# Patient Record
Sex: Female | Born: 1950 | Race: White | Hispanic: No | Marital: Married | Smoking: Former smoker
Health system: Southern US, Community
[De-identification: ages and names within clinical notes are randomized; demographics above are authoritative.]

## PROBLEM LIST (undated history)

## (undated) DIAGNOSIS — E559 Vitamin D deficiency, unspecified: Secondary | ICD-10-CM

## (undated) DIAGNOSIS — K5140 Inflammatory polyps of colon without complications: Secondary | ICD-10-CM

## (undated) DIAGNOSIS — U071 COVID-19: Secondary | ICD-10-CM

## (undated) DIAGNOSIS — K219 Gastro-esophageal reflux disease without esophagitis: Secondary | ICD-10-CM

## (undated) DIAGNOSIS — M797 Fibromyalgia: Secondary | ICD-10-CM

## (undated) DIAGNOSIS — N390 Urinary tract infection, site not specified: Secondary | ICD-10-CM

## (undated) DIAGNOSIS — J189 Pneumonia, unspecified organism: Secondary | ICD-10-CM

## (undated) DIAGNOSIS — E785 Hyperlipidemia, unspecified: Secondary | ICD-10-CM

## (undated) DIAGNOSIS — M1990 Unspecified osteoarthritis, unspecified site: Secondary | ICD-10-CM

## (undated) DIAGNOSIS — I10 Essential (primary) hypertension: Secondary | ICD-10-CM

## (undated) DIAGNOSIS — Z87440 Personal history of urinary (tract) infections: Secondary | ICD-10-CM

## (undated) DIAGNOSIS — I6529 Occlusion and stenosis of unspecified carotid artery: Secondary | ICD-10-CM

## (undated) DIAGNOSIS — Z860100 Personal history of colon polyps, unspecified: Secondary | ICD-10-CM

## (undated) DIAGNOSIS — IMO0002 Reserved for concepts with insufficient information to code with codable children: Secondary | ICD-10-CM

## (undated) DIAGNOSIS — R7303 Prediabetes: Secondary | ICD-10-CM

## (undated) DIAGNOSIS — K259 Gastric ulcer, unspecified as acute or chronic, without hemorrhage or perforation: Secondary | ICD-10-CM

## (undated) HISTORY — DX: Reserved for concepts with insufficient information to code with codable children: IMO0002

## (undated) HISTORY — DX: Occlusion and stenosis of unspecified carotid artery: I65.29

## (undated) HISTORY — DX: Prediabetes: R73.03

## (undated) HISTORY — DX: Unspecified osteoarthritis, unspecified site: M19.90

## (undated) HISTORY — DX: Personal history of colon polyps, unspecified: Z86.0100

## (undated) HISTORY — DX: Gastro-esophageal reflux disease without esophagitis: K21.9

## (undated) HISTORY — DX: Personal history of urinary (tract) infections: Z87.440

## (undated) HISTORY — DX: Urinary tract infection, site not specified: N39.0

## (undated) HISTORY — DX: Pneumonia, unspecified organism: J18.9

## (undated) HISTORY — DX: COVID-19: U07.1

## (undated) HISTORY — DX: Vitamin D deficiency, unspecified: E55.9

## (undated) HISTORY — DX: Hyperlipidemia, unspecified: E78.5

## (undated) HISTORY — DX: Gastric ulcer, unspecified as acute or chronic, without hemorrhage or perforation: K25.9

## (undated) HISTORY — DX: Essential (primary) hypertension: I10

## (undated) HISTORY — DX: Personal history of colonic polyps: Z86.010

## (undated) HISTORY — DX: Inflammatory polyps of colon without complications: K51.40

## (undated) HISTORY — DX: Fibromyalgia: M79.7

---

## 1976-10-11 HISTORY — PX: HERNIA REPAIR: SHX51

## 2007-10-13 ENCOUNTER — Emergency Department: Payer: Self-pay | Admitting: Emergency Medicine

## 2011-01-12 ENCOUNTER — Encounter: Payer: Self-pay | Admitting: Family Medicine

## 2011-01-12 ENCOUNTER — Ambulatory Visit (INDEPENDENT_AMBULATORY_CARE_PROVIDER_SITE_OTHER): Payer: PRIVATE HEALTH INSURANCE | Admitting: Family Medicine

## 2011-01-12 DIAGNOSIS — E785 Hyperlipidemia, unspecified: Secondary | ICD-10-CM

## 2011-01-12 DIAGNOSIS — D126 Benign neoplasm of colon, unspecified: Secondary | ICD-10-CM

## 2011-01-12 DIAGNOSIS — K279 Peptic ulcer, site unspecified, unspecified as acute or chronic, without hemorrhage or perforation: Secondary | ICD-10-CM | POA: Insufficient documentation

## 2011-01-12 DIAGNOSIS — K635 Polyp of colon: Secondary | ICD-10-CM | POA: Insufficient documentation

## 2011-01-12 DIAGNOSIS — I10 Essential (primary) hypertension: Secondary | ICD-10-CM | POA: Insufficient documentation

## 2011-01-12 DIAGNOSIS — M797 Fibromyalgia: Secondary | ICD-10-CM | POA: Insufficient documentation

## 2011-01-12 DIAGNOSIS — IMO0001 Reserved for inherently not codable concepts without codable children: Secondary | ICD-10-CM

## 2011-01-12 DIAGNOSIS — N951 Menopausal and female climacteric states: Secondary | ICD-10-CM

## 2011-01-12 NOTE — Patient Instructions (Signed)
Avoid red meat/ fried foods/ egg yolks/ fatty breakfast meats/ butter, cheese and high fat dairy/ and shellfish   Stop the prempro-- and follow up if you have major problems or symptoms  Stop aleve Avoid ibuprofen and other anti inflammatories for stomach  Tylenol is ok  Please send for last labs and colonoscopy and pap from primary care  Schedule PE in July with labs prior

## 2011-01-12 NOTE — Progress Notes (Signed)
Subjective:    Patient ID: Beth Fisher, female    DOB: 1951/08/13, 60 y.o.   MRN: [id number]  HPI  Here to get est with new doctor for  numerous medical conditions  Used to see Dr Dagoberto Ligas -endocrinology for her primary care and he retired  Saw him for 20 years    Hyperlipidemia on crestor and zetia and welchol and antera (fenofibrate)  The crestor is twice per week  Last chol was very very high before starting meds Will call for lipids from feb  Eats a healthy diet  occ sat fats  Red meat -- twice weekly  No fried foods  Shellfish once per month  Eats low fat cheese, little ice cream , avoids butter  No southern breakfasts  1% milk    HTN on benicar Is in good control  Exercise 3 times per week at gym 45 treadmill and 15 of weights  Is proud of that   Menopause - on prempro On hrt - one pill every 3 days for a while  Had severe menopausal symptoms   Hx of ulcer-on otc acid reducer-- ? pepid   Thinks peptic Many years ago  No bleeding  No endo     Fibromyalgia - exercise really helps  On elavil  Other meds have not helped   No depression or anxiety   Will be due for health mt/ physical last summer- did not do pap  Does get regular mammograms July 11  Does regular derm exams - summer of 2011   Colon mass? In 09-- removed and then polyp in 2011 nov Dr Troy Sine -- 5 years   Past Medical History  Diagnosis Date  . Ulcer   . Hypertension   . Hyperlipidemia   . History of UTI   . Fibromyalgia   . Inflammatory polyps of colon     08/2008 OK 08/2010 [facility] Dr Kinnie Scales   Past Surgical History  Procedure Date  . Hernia repair 1978    reports that she quit smoking about 30 years ago. She does not have any smokeless tobacco history on file. Her alcohol and drug histories not on file. family history includes Alcohol abuse in her mother; Alzheimer's disease in her father and mother; Cancer in her mother and paternal grandmother; Diabetes in her father and paternal  grandmother; Heart disease in her cousin and father; Hyperlipidemia in her father and mother; and Mental illness in her father. No Known Allergies  History   Social History  . Marital Status: Married    Spouse Name: N/A    Number of Children: N/A  . Years of Education: N/A   Occupational History  . Not on file.   Social History Main Topics  . Smoking status: Former Smoker    Quit date: 10/11/1980  . Smokeless tobacco: Not on file  . Alcohol Use: Not on file  . Drug Use: Not on file  . Sexually Active: Not on file   Other Topics Concern  . Not on file   Social History Narrative  . No narrative on file    Family History  Problem Relation Age of Onset  . Alcohol abuse Mother   . Hyperlipidemia Mother   . Alzheimer's disease Mother   . Cancer Mother     breast cancer  . Diabetes Father   . Heart disease Father   . Hyperlipidemia Father   . Mental illness Father   . Alzheimer's disease Father   . Cancer Paternal Grandmother  colon  . Diabetes Paternal Grandmother   . Heart disease Cousin     sudden death less than 42 yrs old heart attack.           Review of Systems  Constitutional: Negative for fever, chills, fatigue and unexpected weight change.  Eyes: Negative for visual disturbance.  Respiratory: Negative for cough, chest tightness and shortness of breath.   Cardiovascular: Negative.   Gastrointestinal: Negative for abdominal pain, diarrhea and constipation.  Genitourinary: Negative for urgency, frequency and vaginal bleeding.  Musculoskeletal: Positive for myalgias, back pain and arthralgias. Negative for joint swelling.  Skin: Negative for color change, pallor and rash.  Neurological: Negative for tremors, weakness, light-headedness and headaches.  Hematological: Negative for adenopathy. Does not bruise/bleed easily.  Psychiatric/Behavioral: Negative for decreased concentration. The patient is not nervous/anxious.        Objective:   Physical  Exam  Constitutional: She appears well-developed and well-nourished.  HENT:  Head: Normocephalic and atraumatic.  Right Ear: External ear normal.  Left Ear: External ear normal.  Mouth/Throat: Oropharynx is clear and moist.  Eyes: Conjunctivae are normal. Pupils are equal, round, and reactive to light.  Neck: Normal range of motion. Neck supple. No JVD present. Carotid bruit is not present. No tracheal deviation present. No thyromegaly present.  Cardiovascular: Normal rate, regular rhythm and normal heart sounds.   Pulmonary/Chest: Effort normal and breath sounds normal.  Abdominal: Soft. Bowel sounds are normal. She exhibits no mass. There is no tenderness.  Musculoskeletal: She exhibits tenderness.  Lymphadenopathy:    She has no cervical adenopathy.  Neurological: She is alert. She has normal reflexes. She exhibits normal muscle tone. Coordination normal.  Skin: Skin is warm and dry. No rash noted. No pallor.  Psychiatric: She has a normal mood and affect.          Assessment & Plan:

## 2011-01-15 NOTE — Assessment & Plan Note (Signed)
Controlled with low impact exercise and elavil  No trigger points today  Pt feels she is doing well  Disc imp of good sleep and wt loss

## 2011-01-15 NOTE — Assessment & Plan Note (Signed)
No symptoms currently Adv NOT to use nsaids at all  Will continue to follow

## 2011-01-15 NOTE — Assessment & Plan Note (Signed)
Pt has decided to come off hrt Disc risks incl breast ca and also vascular/ blood clot  Feels risks outweigh benefits at this time Will update me if menopause symptoms become severe

## 2011-01-15 NOTE — Assessment & Plan Note (Signed)
Send for last colonosc report Is compliant with these No stool changes Will continue to follow

## 2011-01-15 NOTE — Assessment & Plan Note (Signed)
On mult meds for what sounds like extremely high chol Sent for lab records  Plan lab and f/u in summer Long disc of low sat fat diet today

## 2011-01-15 NOTE — Assessment & Plan Note (Signed)
In good control with current regimen Disc imp of wt loss and fitness  Sodium avoidance also disc Plan lab and PE for summer

## 2011-01-28 ENCOUNTER — Encounter: Payer: Self-pay | Admitting: Family Medicine

## 2011-02-10 ENCOUNTER — Other Ambulatory Visit: Payer: Self-pay | Admitting: *Deleted

## 2011-02-10 MED ORDER — COLESEVELAM HCL 625 MG PO TABS: ORAL_TABLET | ORAL | Status: DC

## 2011-04-20 ENCOUNTER — Telehealth (INDEPENDENT_AMBULATORY_CARE_PROVIDER_SITE_OTHER): Payer: PRIVATE HEALTH INSURANCE | Admitting: Family Medicine

## 2011-04-20 ENCOUNTER — Other Ambulatory Visit (INDEPENDENT_AMBULATORY_CARE_PROVIDER_SITE_OTHER): Payer: PRIVATE HEALTH INSURANCE | Admitting: Family Medicine

## 2011-04-20 DIAGNOSIS — E785 Hyperlipidemia, unspecified: Secondary | ICD-10-CM

## 2011-04-20 DIAGNOSIS — I10 Essential (primary) hypertension: Secondary | ICD-10-CM

## 2011-04-20 DIAGNOSIS — Z0000 Encounter for general adult medical examination without abnormal findings: Secondary | ICD-10-CM

## 2011-04-20 LAB — COMPREHENSIVE METABOLIC PANEL
ALT: 25 U/L (ref 0–35)
AST: 24 U/L (ref 0–37)
Albumin: 4.6 g/dL (ref 3.5–5.2)
Alkaline Phosphatase: 38 U/L — ABNORMAL LOW (ref 39–117)
BUN: 12 mg/dL (ref 6–23)
CO2: 27 mEq/L (ref 19–32)
Calcium: 9.5 mg/dL (ref 8.4–10.5)
Chloride: 110 mEq/L (ref 96–112)
Creatinine, Ser: 0.8 mg/dL (ref 0.4–1.2)
GFR: 76.54 mL/min (ref >60.00)
Glucose, Bld: 95 mg/dL (ref 70–99)
Potassium: 4.1 mEq/L (ref 3.5–5.1)
Sodium: 142 mEq/L (ref 135–145)
Total Bilirubin: 0.7 mg/dL (ref 0.3–1.2)
Total Protein: 7.5 g/dL (ref 6.0–8.3)

## 2011-04-20 LAB — CBC WITH DIFFERENTIAL/PLATELET
Basophils Absolute: 0 10*3/uL (ref 0.0–0.1)
Basophils Relative: 0.4 % (ref 0.0–3.0)
Eosinophils Absolute: 0.2 10*3/uL (ref 0.0–0.7)
Eosinophils Relative: 3.3 % (ref 0.0–5.0)
HCT: 37.7 % (ref 36.0–46.0)
Hemoglobin: 13.1 g/dL (ref 12.0–15.0)
Lymphocytes Relative: 35.1 % (ref 12.0–46.0)
Lymphs Abs: 1.8 10*3/uL (ref 0.7–4.0)
MCHC: 34.8 g/dL (ref 30.0–36.0)
MCV: 88.4 fl (ref 78.0–100.0)
Monocytes Absolute: 0.5 10*3/uL (ref 0.1–1.0)
Monocytes Relative: 10.2 % (ref 3.0–12.0)
Neutro Abs: 2.7 10*3/uL (ref 1.4–7.7)
Neutrophils Relative %: 51 % (ref 43.0–77.0)
Platelets: 328 10*3/uL (ref 150.0–400.0)
RBC: 4.26 Mil/uL (ref 3.87–5.11)
RDW: 12.4 % (ref 11.5–14.6)
WBC: 5.3 10*3/uL (ref 4.5–10.5)

## 2011-04-20 LAB — LIPID PANEL
Cholesterol: 198 mg/dL (ref 0–200)
HDL: 47.7 mg/dL (ref >39.00)
Total CHOL/HDL Ratio: 4
Triglycerides: 210 mg/dL — ABNORMAL HIGH (ref 0.0–149.0)
VLDL: 42 mg/dL — ABNORMAL HIGH (ref 0.0–40.0)

## 2011-04-20 LAB — LDL CHOLESTEROL, DIRECT: Direct LDL: 149.3 mg/dL

## 2011-04-20 LAB — TSH: TSH: 4.02 u[IU]/mL (ref 0.35–5.50)

## 2011-04-20 NOTE — Telephone Encounter (Signed)
Message copied by Judy Pimple on Tue Apr 20, 2011  8:36 AM ------      Message from: Alvina Chou      Created: Mon Apr 19, 2011 11:01 AM       Patient is scheduled for CPX labs, please order future labs, Thanks , Camelia Eng

## 2011-04-27 ENCOUNTER — Ambulatory Visit (INDEPENDENT_AMBULATORY_CARE_PROVIDER_SITE_OTHER): Payer: PRIVATE HEALTH INSURANCE | Admitting: Family Medicine

## 2011-04-27 ENCOUNTER — Other Ambulatory Visit (HOSPITAL_COMMUNITY)
Admission: RE | Admit: 2011-04-27 | Discharge: 2011-04-27 | Disposition: A | Discharge status: Home / Self Care | Payer: PRIVATE HEALTH INSURANCE | Source: Ambulatory Visit | Attending: Family Medicine | Admitting: Family Medicine

## 2011-04-27 ENCOUNTER — Encounter: Payer: Self-pay | Admitting: Family Medicine

## 2011-04-27 VITALS — BP 120/76 | HR 76 | Temp 97.9°F | Ht 66.0 in | Wt 227.2 lb

## 2011-04-27 DIAGNOSIS — N951 Menopausal and female climacteric states: Secondary | ICD-10-CM

## 2011-04-27 DIAGNOSIS — I10 Essential (primary) hypertension: Secondary | ICD-10-CM

## 2011-04-27 DIAGNOSIS — M797 Fibromyalgia: Secondary | ICD-10-CM

## 2011-04-27 DIAGNOSIS — K635 Polyp of colon: Secondary | ICD-10-CM

## 2011-04-27 DIAGNOSIS — E785 Hyperlipidemia, unspecified: Secondary | ICD-10-CM

## 2011-04-27 DIAGNOSIS — Z01419 Encounter for gynecological examination (general) (routine) without abnormal findings: Secondary | ICD-10-CM | POA: Insufficient documentation

## 2011-04-27 DIAGNOSIS — IMO0001 Reserved for inherently not codable concepts without codable children: Secondary | ICD-10-CM

## 2011-04-27 DIAGNOSIS — Z0000 Encounter for general adult medical examination without abnormal findings: Secondary | ICD-10-CM

## 2011-04-27 DIAGNOSIS — Z1159 Encounter for screening for other viral diseases: Secondary | ICD-10-CM | POA: Insufficient documentation

## 2011-04-27 DIAGNOSIS — D126 Benign neoplasm of colon, unspecified: Secondary | ICD-10-CM

## 2011-04-27 LAB — HM PAP SMEAR: HM Pap smear: NORMAL

## 2011-04-27 MED ORDER — ROSUVASTATIN CALCIUM 10 MG PO TABS: ORAL_TABLET | ORAL | Status: DC

## 2011-04-27 MED ORDER — COLESEVELAM HCL 625 MG PO TABS: ORAL_TABLET | ORAL | Status: DC

## 2011-04-27 MED ORDER — OLMESARTAN MEDOXOMIL 20 MG PO TABS: 20.0000 mg | ORAL_TABLET | Freq: Every day | ORAL | Status: DC

## 2011-04-27 MED ORDER — AMITRIPTYLINE HCL 25 MG PO TABS: 25.0000 mg | ORAL_TABLET | Freq: Every day | ORAL | Status: DC

## 2011-04-27 MED ORDER — FENOFIBRATE MICRONIZED 130 MG PO CAPS: 130.0000 mg | ORAL_CAPSULE | Freq: Every day | ORAL | Status: DC

## 2011-04-27 MED ORDER — EZETIMIBE 10 MG PO TABS: 10.0000 mg | ORAL_TABLET | Freq: Every day | ORAL | Status: DC

## 2011-04-27 NOTE — Progress Notes (Signed)
Subjective:    Patient ID: Beth Fisher, female    DOB: [birth year], 60 y.o.   MRN: [id number]  HPI Here for health mt exam and to review chronic med problems  Wt is down 7 lb  Is feeling good overall  No new medical problems  Went off her prempro and thinks she is doing ok overall  Is more outspoken    Gyn care Last gyn exam Dr Dagoberto Ligas  Was 2-3 years ago - wants to do her pap today No gyn problems  Td- 4 years ago  Zoster status has never had shingles  Is very interested in vaccine    Mammogram   (mother had breast cancer) Gets mam at [facility] - is due in aug/ sept and will make appt  colonosc 11/11 adenom polyp - re check 5 y No bowel changes   On welchol and zetia for chol and crestor  LDL 149 and HDL 47 Lab Results  Component Value Date   CHOL 198 04/20/2011   Lab Results  Component Value Date   HDL 47.70 04/20/2011   No results found for this basename: LDLCALC   Lab Results  Component Value Date   TRIG 210.0* 04/20/2011   Lab Results  Component Value Date   CHOLHDL 4 04/20/2011   Lab Results  Component Value Date   LDLDIRECT 149.3 04/20/2011   cannot go up any farther on crestor due to leg pain  Intol of all other statins  Per pt is much much better than it was   Stopped eating beef and is really working on her diet   Patient Active Problem List  Diagnoses  . Hypertension  . Hyperlipidemia  . Peptic ulcer disease  . Fibromyalgia  . Colon polyps  . Menopause syndrome  . Routine general medical examination at a health care facility  . Gynecological examination   Past Medical History  Diagnosis Date  . Ulcer   . Hypertension   . Hyperlipidemia   . History of UTI   . Fibromyalgia   . Inflammatory polyps of colon     08/2008 OK 08/2010 [facility] Dr Kinnie Scales   Past Surgical History  Procedure Date  . Hernia repair 1978   History  Substance Use Topics  . Smoking status: Former Smoker    Quit date: 10/11/1980  . Smokeless tobacco: Not on file    . Alcohol Use: Not on file   Family History  Problem Relation Age of Onset  . Alcohol abuse Mother   . Hyperlipidemia Mother   . Alzheimer's disease Mother   . Cancer Mother     breast cancer  . Diabetes Father   . Heart disease Father   . Hyperlipidemia Father   . Mental illness Father   . Alzheimer's disease Father   . Cancer Paternal Grandmother     colon  . Diabetes Paternal Grandmother   . Heart disease Cousin     sudden death less than 47 yrs old heart attack.   No Known Allergies Current Outpatient Prescriptions on File Prior to Visit  Medication Sig Dispense Refill  . famotidine (ACID REDUCER) 10 MG tablet OTC as directed.       Boris Lown Oil CAPS Take by mouth. 1 capsule by mouth daily (?Mg)       . Multiple Vitamin (MULTIVITAMIN) capsule Take 1 capsule by mouth daily.               Review of Systems Review of Systems  Constitutional: Negative for fever, appetite change, fatigue and unexpected weight change.  Eyes: Negative for pain and visual disturbance.  Respiratory: Negative for cough and shortness of breath.   Cardiovascular: Negative. For cp or palpitations    Gastrointestinal: Negative for nausea, diarrhea and constipation.  Genitourinary: Negative for urgency and frequency.  Skin: Negative for pallor. or rash MSK pos for joint and muscle pain that is chronic  Neurological: Negative for weakness, light-headedness, numbness and headaches.  Hematological: Negative for adenopathy. Does not bruise/bleed easily.  Psychiatric/Behavioral: Negative for dysphoric mood. The patient is not nervous/anxious.         Objective:   Physical Exam  Constitutional: She appears well-developed and well-nourished. No distress.       overwt and well appearing   HENT:  Head: Normocephalic and atraumatic.  Right Ear: External ear normal.  Left Ear: External ear normal.  Nose: Nose normal.  Mouth/Throat: Oropharynx is clear and moist.  Eyes: Conjunctivae and EOM are  normal. Pupils are equal, round, and reactive to light.  Neck: Normal range of motion. Neck supple. No JVD present. Carotid bruit is not present. No thyromegaly present.  Cardiovascular: Normal rate, regular rhythm, normal heart sounds and intact distal pulses.   Pulmonary/Chest: Effort normal and breath sounds normal. No respiratory distress. She has no wheezes.  Abdominal: Soft. Bowel sounds are normal. She exhibits no distension, no abdominal bruit and no mass. There is no tenderness.  Genitourinary: Vagina normal and uterus normal. No breast swelling, tenderness, discharge or bleeding. No vaginal discharge found.  Musculoskeletal: Normal range of motion. She exhibits no edema and no tenderness.  Lymphadenopathy:    She has no cervical adenopathy.  Neurological: She is alert. She has normal reflexes. Coordination normal.  Skin: Skin is warm and dry. No rash noted. No erythema. No pallor.  Psychiatric: She has a normal mood and affect.          Assessment & Plan:

## 2011-04-27 NOTE — Patient Instructions (Addendum)
Keep working on weight loss with healthy diet and exercise Avoid red meat/ fried foods/ egg yolks/ fatty breakfast meats/ butter, cheese and high fat dairy/ and shellfish   If you are interested in shingles vaccine in future - call your insurance company to see how coverage is and call us to schedule Follow up in 6 months  Don't forget to schedule your annual mammogram  Try to get 1200-1500 mg of calcium per day with at least 1000 iu of vitamin D - for bone health I sent your px in to the pharmacy

## 2011-04-27 NOTE — Assessment & Plan Note (Signed)
Pt non tol with statin - is best she can be on this combo of meds and sched Rev labs with pt Disc low sat fat diet  Lab 6 months

## 2011-04-27 NOTE — Assessment & Plan Note (Signed)
Ok off HRT- commended !

## 2011-04-27 NOTE — Assessment & Plan Note (Signed)
Reviewed health habits including diet and exercise and skin cancer prevention Also reviewed health mt list, fam hx and immunizations  Rev wellness labs in detail Will keep working on wt loss

## 2011-04-27 NOTE — Assessment & Plan Note (Signed)
Good control no change Rev labs with pt  Commended on lifestyle change and wt loss so far

## 2011-04-27 NOTE — Assessment & Plan Note (Signed)
Due for re check 2016- doing ok

## 2011-04-27 NOTE — Assessment & Plan Note (Signed)
Refilled elavil which works well

## 2011-04-27 NOTE — Assessment & Plan Note (Signed)
Nl exam Pend pap

## 2011-07-20 ENCOUNTER — Other Ambulatory Visit: Payer: Self-pay | Admitting: *Deleted

## 2011-07-21 ENCOUNTER — Encounter: Payer: Self-pay | Admitting: Family Medicine

## 2011-07-21 NOTE — Telephone Encounter (Signed)
Chart opened in error

## 2011-07-22 ENCOUNTER — Ambulatory Visit (INDEPENDENT_AMBULATORY_CARE_PROVIDER_SITE_OTHER): Payer: PRIVATE HEALTH INSURANCE | Admitting: *Deleted

## 2011-07-22 ENCOUNTER — Ambulatory Visit (INDEPENDENT_AMBULATORY_CARE_PROVIDER_SITE_OTHER): Payer: PRIVATE HEALTH INSURANCE

## 2011-07-22 DIAGNOSIS — Z23 Encounter for immunization: Secondary | ICD-10-CM

## 2011-07-22 DIAGNOSIS — Z2911 Encounter for prophylactic immunotherapy for respiratory syncytial virus (RSV): Secondary | ICD-10-CM

## 2011-07-23 ENCOUNTER — Encounter: Payer: Self-pay | Admitting: Family Medicine

## 2011-07-26 ENCOUNTER — Encounter: Payer: Self-pay | Admitting: *Deleted

## 2011-08-24 ENCOUNTER — Ambulatory Visit: Payer: PRIVATE HEALTH INSURANCE

## 2011-11-02 ENCOUNTER — Ambulatory Visit: Payer: PRIVATE HEALTH INSURANCE | Admitting: Family Medicine

## 2011-11-09 ENCOUNTER — Encounter: Payer: Self-pay | Admitting: Family Medicine

## 2011-11-09 ENCOUNTER — Ambulatory Visit (INDEPENDENT_AMBULATORY_CARE_PROVIDER_SITE_OTHER): Payer: PRIVATE HEALTH INSURANCE | Admitting: Family Medicine

## 2011-11-09 VITALS — BP 118/78 | HR 84 | Temp 97.3°F | Ht 67.0 in | Wt 231.8 lb

## 2011-11-09 DIAGNOSIS — E669 Obesity, unspecified: Secondary | ICD-10-CM | POA: Insufficient documentation

## 2011-11-09 DIAGNOSIS — J069 Acute upper respiratory infection, unspecified: Secondary | ICD-10-CM | POA: Insufficient documentation

## 2011-11-09 DIAGNOSIS — I10 Essential (primary) hypertension: Secondary | ICD-10-CM

## 2011-11-09 DIAGNOSIS — E785 Hyperlipidemia, unspecified: Secondary | ICD-10-CM

## 2011-11-09 LAB — LIPID PANEL
Cholesterol: 231 mg/dL — ABNORMAL HIGH (ref 0–200)
HDL: 49 mg/dL (ref >39.00)
Total CHOL/HDL Ratio: 5
Triglycerides: 113 mg/dL (ref 0.0–149.0)
VLDL: 22.6 mg/dL (ref 0.0–40.0)

## 2011-11-09 LAB — COMPREHENSIVE METABOLIC PANEL
ALT: 24 U/L (ref 0–35)
AST: 23 U/L (ref 0–37)
Albumin: 4.6 g/dL (ref 3.5–5.2)
Alkaline Phosphatase: 46 U/L (ref 39–117)
BUN: 11 mg/dL (ref 6–23)
CO2: 25 mEq/L (ref 19–32)
Calcium: 9.9 mg/dL (ref 8.4–10.5)
Chloride: 109 mEq/L (ref 96–112)
Creatinine, Ser: 0.9 mg/dL (ref 0.4–1.2)
GFR: 65.14 mL/min (ref >60.00)
Glucose, Bld: 90 mg/dL (ref 70–99)
Potassium: 4.7 mEq/L (ref 3.5–5.1)
Sodium: 144 mEq/L (ref 135–145)
Total Bilirubin: 0.7 mg/dL (ref 0.3–1.2)
Total Protein: 8 g/dL (ref 6.0–8.3)

## 2011-11-09 NOTE — Assessment & Plan Note (Signed)
Now off zetia due to myalgias Diet fair Rev low sat fat  Lab today

## 2011-11-09 NOTE — Assessment & Plan Note (Signed)
Viral uri with congestion Disc symptomatic care - see instructions on AVS  Update if not starting to improve in a week or if worsening

## 2011-11-09 NOTE — Assessment & Plan Note (Signed)
bp in fair control at this time  No changes needed  Disc lifstyle change with low sodium diet and exercise   

## 2011-11-09 NOTE — Progress Notes (Signed)
Subjective:    Patient ID: Beth Fisher, female    DOB: 24-Aug-1951, 61 y.o.   MRN: [id number]  HPI Here for f/u of HTN and lipids and also sick visit- uri Has been exp to both flu and strep - from little kids About 2 days ago -- R ear hurts and throat hurts Sinus congestion and drainage and sneezing  No chills or sweats or body aches  Had a flu shot     bp is 118/78     Today No cp or palpitations or headaches or edema  No side effects to medicines    Lab Results  Component Value Date   CHOL 198 04/20/2011   HDL 47.70 04/20/2011   LDLDIRECT 149.3 04/20/2011   TRIG 210.0* 04/20/2011   CHOLHDL 4 04/20/2011   welchol and crestor  Stopped the zetia- it gave her muscle aches -- and is improved now  crestor is just twice per week  Needs to check today  Is eating a low sat fat diet (did just have a birthday)    Wt is up 1 lb with bmi of 36 Aware she needs to loose wt for better health ? Needs to be more motivated   Patient Active Problem List  Diagnoses  . Hypertension  . Hyperlipidemia  . Peptic ulcer disease  . Fibromyalgia  . Colon polyps  . Menopause syndrome  . Routine general medical examination at a health care facility  . Gynecological examination  . Viral URI  . Obesity   Past Medical History  Diagnosis Date  . Ulcer   . Hypertension   . Hyperlipidemia   . History of UTI   . Fibromyalgia   . Inflammatory polyps of colon     08/2008 OK 08/2010 [facility] Dr Kinnie Scales   Past Surgical History  Procedure Date  . Hernia repair 1978   History  Substance Use Topics  . Smoking status: Former Smoker    Quit date: 10/11/1980  . Smokeless tobacco: Not on file  . Alcohol Use: Not on file   Family History  Problem Relation Age of Onset  . Alcohol abuse Mother   . Hyperlipidemia Mother   . Alzheimer's disease Mother   . Cancer Mother     breast cancer  . Diabetes Father   . Heart disease Father   . Hyperlipidemia Father   . Mental illness Father   .  Alzheimer's disease Father   . Cancer Paternal Grandmother     colon  . Diabetes Paternal Grandmother   . Heart disease Cousin     sudden death less than 19 yrs old heart attack.   Allergies  Allergen Reactions  . Zetia (Ezetimibe) Other (See Comments)    Muscle aches   Current Outpatient Prescriptions on File Prior to Visit  Medication Sig Dispense Refill  . amitriptyline (ELAVIL) 25 MG tablet Take 1 tablet (25 mg total) by mouth at bedtime.  30 tablet  11  . colesevelam (WELCHOL) 625 MG tablet Take 3 tablets by mouth twice a day.  180 tablet  11  . famotidine (ACID REDUCER) 10 MG tablet OTC as directed.       . fenofibrate micronized (ANTARA) 130 MG capsule Take 1 capsule (130 mg total) by mouth daily before breakfast.  30 capsule  11  . Krill Oil CAPS Take by mouth. 1 capsule by mouth daily (?Mg)       . Multiple Vitamin (MULTIVITAMIN) capsule Take 1 capsule by mouth daily.        Marland Kitchen  olmesartan (BENICAR) 20 MG tablet Take 1 tablet (20 mg total) by mouth daily.  30 tablet  11  . Prenatal MV-Min-Fe Cbn-FA-DHA (BRAINSTRONG PRENATAL PO) Take 300 mg by mouth daily.        . rosuvastatin (CRESTOR) 10 MG tablet 1 tablet by mouth on Mon and Thur of each week.  8 tablet  11       Review of Systems Review of Systems  Constitutional: Negative for fever, appetite change, and unexpected weight change. pos for fatigue Eyes: Negative for pain and visual disturbance.  ENT pos for rhinorrhea and congestion and st , neg for sinus pain  Respiratory: Negative for sob or wheeze  Cardiovascular: Negative for cp or palpitations    Gastrointestinal: Negative for nausea, diarrhea and constipation.  Genitourinary: Negative for urgency and frequency.  Skin: Negative for pallor or rash   Neurological: Negative for weakness, light-headedness, numbness and headaches.  Hematological: Negative for adenopathy. Does not bruise/bleed easily.  Psychiatric/Behavioral: Negative for dysphoric mood. The patient is  not nervous/anxious.          Objective:   Physical Exam  Constitutional: She appears well-developed and well-nourished. No distress.       Obese and well appearing   HENT:  Head: Normocephalic and atraumatic.  Right Ear: External ear normal.  Left Ear: External ear normal.  Mouth/Throat: No oropharyngeal exudate.       Nares are injected and congested  No sinus tenderness Throat- mild post nasal drip/ no erythema  Eyes: Conjunctivae and EOM are normal. Pupils are equal, round, and reactive to light. Right eye exhibits no discharge. Left eye exhibits no discharge. No scleral icterus.  Neck: Normal range of motion. Neck supple. No JVD present. Carotid bruit is not present. No thyromegaly present.  Cardiovascular: Normal rate, regular rhythm, normal heart sounds and intact distal pulses.  Exam reveals no gallop.   Pulmonary/Chest: Effort normal and breath sounds normal. No respiratory distress. She has no wheezes. She exhibits no tenderness.  Abdominal: Soft. Bowel sounds are normal. She exhibits no distension, no abdominal bruit and no mass. There is no tenderness.  Musculoskeletal: She exhibits no edema and no tenderness.  Lymphadenopathy:    She has no cervical adenopathy.  Neurological: She is alert. She has normal reflexes. No cranial nerve deficit. She exhibits normal muscle tone. Coordination normal.  Skin: Skin is warm and dry. No rash noted. No erythema. No pallor.  Psychiatric: She has a normal mood and affect.          Assessment & Plan:

## 2011-11-09 NOTE — Patient Instructions (Signed)
Drink lots of fluids and rest  Tylenol cold is ok  Use nasal saline spray  You will start coughing more as the week goes on  If high fever- call  Update if not starting to improve in a week or if worsening   No change in medicines Lab today bp is good Work on weight loss Schedule annual exam in 6 months with labs prior

## 2011-11-10 LAB — LDL CHOLESTEROL, DIRECT: Direct LDL: 168.2 mg/dL

## 2011-11-11 NOTE — Assessment & Plan Note (Signed)
Discussed how this problem influences overall health and the risks it imposes  Reviewed plan for weight loss with lower calorie diet (via better food choices and also portion control or program like weight watchers) and exercise building up to or more than 30 minutes 5 days per week including some aerobic activity    

## 2011-12-10 DIAGNOSIS — J189 Pneumonia, unspecified organism: Secondary | ICD-10-CM

## 2011-12-10 HISTORY — DX: Pneumonia, unspecified organism: J18.9

## 2011-12-16 ENCOUNTER — Ambulatory Visit (INDEPENDENT_AMBULATORY_CARE_PROVIDER_SITE_OTHER)
Admission: RE | Admit: 2011-12-16 | Discharge: 2011-12-16 | Disposition: A | Discharge status: Home / Self Care | Payer: PRIVATE HEALTH INSURANCE | Source: Ambulatory Visit | Attending: Family Medicine | Admitting: Family Medicine

## 2011-12-16 ENCOUNTER — Telehealth: Payer: Self-pay | Admitting: Family Medicine

## 2011-12-16 ENCOUNTER — Encounter (HOSPITAL_COMMUNITY): Payer: Self-pay | Admitting: Internal Medicine

## 2011-12-16 ENCOUNTER — Encounter: Payer: Self-pay | Admitting: Family Medicine

## 2011-12-16 ENCOUNTER — Inpatient Hospital Stay (HOSPITAL_COMMUNITY)
Admission: EM | Admit: 2011-12-16 | Discharge: 2011-12-20 | DRG: 193 | Disposition: A | Discharge status: Home / Self Care | Payer: PRIVATE HEALTH INSURANCE | Attending: Internal Medicine | Admitting: Internal Medicine

## 2011-12-16 ENCOUNTER — Ambulatory Visit (INDEPENDENT_AMBULATORY_CARE_PROVIDER_SITE_OTHER): Payer: PRIVATE HEALTH INSURANCE | Admitting: Family Medicine

## 2011-12-16 VITALS — BP 126/78 | HR 116 | Temp 102.5°F

## 2011-12-16 DIAGNOSIS — R05 Cough: Secondary | ICD-10-CM

## 2011-12-16 DIAGNOSIS — R059 Cough, unspecified: Secondary | ICD-10-CM

## 2011-12-16 DIAGNOSIS — N951 Menopausal and female climacteric states: Secondary | ICD-10-CM

## 2011-12-16 DIAGNOSIS — IMO0001 Reserved for inherently not codable concepts without codable children: Secondary | ICD-10-CM | POA: Diagnosis present

## 2011-12-16 DIAGNOSIS — K635 Polyp of colon: Secondary | ICD-10-CM

## 2011-12-16 DIAGNOSIS — M797 Fibromyalgia: Secondary | ICD-10-CM

## 2011-12-16 DIAGNOSIS — I10 Essential (primary) hypertension: Secondary | ICD-10-CM

## 2011-12-16 DIAGNOSIS — Z01419 Encounter for gynecological examination (general) (routine) without abnormal findings: Secondary | ICD-10-CM

## 2011-12-16 DIAGNOSIS — J9600 Acute respiratory failure, unspecified whether with hypoxia or hypercapnia: Secondary | ICD-10-CM | POA: Diagnosis present

## 2011-12-16 DIAGNOSIS — R509 Fever, unspecified: Secondary | ICD-10-CM

## 2011-12-16 DIAGNOSIS — J189 Pneumonia, unspecified organism: Principal | ICD-10-CM

## 2011-12-16 DIAGNOSIS — D649 Anemia, unspecified: Secondary | ICD-10-CM | POA: Diagnosis present

## 2011-12-16 DIAGNOSIS — I959 Hypotension, unspecified: Secondary | ICD-10-CM | POA: Diagnosis present

## 2011-12-16 DIAGNOSIS — E785 Hyperlipidemia, unspecified: Secondary | ICD-10-CM

## 2011-12-16 DIAGNOSIS — Z0000 Encounter for general adult medical examination without abnormal findings: Secondary | ICD-10-CM

## 2011-12-16 DIAGNOSIS — K279 Peptic ulcer, site unspecified, unspecified as acute or chronic, without hemorrhage or perforation: Secondary | ICD-10-CM

## 2011-12-16 DIAGNOSIS — E669 Obesity, unspecified: Secondary | ICD-10-CM

## 2011-12-16 DIAGNOSIS — R5381 Other malaise: Secondary | ICD-10-CM | POA: Diagnosis present

## 2011-12-16 LAB — URINALYSIS, ROUTINE W REFLEX MICROSCOPIC
Bilirubin Urine: NEGATIVE
Glucose, UA: NEGATIVE mg/dL
Hgb urine dipstick: NEGATIVE
Ketones, ur: NEGATIVE mg/dL
Leukocytes, UA: NEGATIVE
Nitrite: POSITIVE — AB
Protein, ur: NEGATIVE mg/dL
Specific Gravity, Urine: 1.013 (ref 1.005–1.030)
Urobilinogen, UA: 0.2 mg/dL (ref 0.0–1.0)
pH: 6 (ref 5.0–8.0)

## 2011-12-16 LAB — COMPREHENSIVE METABOLIC PANEL
ALT: 22 U/L (ref 0–35)
AST: 22 U/L (ref 0–37)
Albumin: 3.8 g/dL (ref 3.5–5.2)
Alkaline Phosphatase: 46 U/L (ref 39–117)
BUN: 12 mg/dL (ref 6–23)
CO2: 20 mEq/L (ref 19–32)
Calcium: 8.7 mg/dL (ref 8.4–10.5)
Chloride: 104 mEq/L (ref 96–112)
Creatinine, Ser: 0.84 mg/dL (ref 0.50–1.10)
GFR calc Af Amer: 85 mL/min — ABNORMAL LOW (ref >90)
GFR calc non Af Amer: 74 mL/min — ABNORMAL LOW (ref >90)
Glucose, Bld: 97 mg/dL (ref 70–99)
Potassium: 3.2 mEq/L — ABNORMAL LOW (ref 3.5–5.1)
Sodium: 135 mEq/L (ref 135–145)
Total Bilirubin: 0.6 mg/dL (ref 0.3–1.2)
Total Protein: 6.9 g/dL (ref 6.0–8.3)

## 2011-12-16 LAB — DIFFERENTIAL
Basophils Absolute: 0 10*3/uL (ref 0.0–0.1)
Basophils Relative: 0 % (ref 0–1)
Eosinophils Absolute: 0 10*3/uL (ref 0.0–0.7)
Eosinophils Relative: 0 % (ref 0–5)
Lymphocytes Relative: 9 % — ABNORMAL LOW (ref 12–46)
Lymphs Abs: 0.9 10*3/uL (ref 0.7–4.0)
Monocytes Absolute: 0.6 10*3/uL (ref 0.1–1.0)
Monocytes Relative: 6 % (ref 3–12)
Neutro Abs: 8.6 10*3/uL — ABNORMAL HIGH (ref 1.7–7.7)
Neutrophils Relative %: 85 % — ABNORMAL HIGH (ref 43–77)
Smear Review: ADEQUATE

## 2011-12-16 LAB — CBC
HCT: 34.5 % — ABNORMAL LOW (ref 36.0–46.0)
Hemoglobin: 11.9 g/dL — ABNORMAL LOW (ref 12.0–15.0)
MCH: 29.9 pg (ref 26.0–34.0)
MCHC: 34.5 g/dL (ref 30.0–36.0)
MCV: 86.7 fL (ref 78.0–100.0)
Platelets: 221 10*3/uL (ref 150–400)
RBC: 3.98 MIL/uL (ref 3.87–5.11)
RDW: 13 % (ref 11.5–15.5)
WBC: 10.1 10*3/uL (ref 4.0–10.5)

## 2011-12-16 LAB — LACTIC ACID, PLASMA: Lactic Acid, Venous: 1.6 mmol/L (ref 0.5–2.2)

## 2011-12-16 LAB — URINE MICROSCOPIC-ADD ON

## 2011-12-16 LAB — PROCALCITONIN: Procalcitonin: 1.88 ng/mL

## 2011-12-16 MED ORDER — ALBUTEROL SULFATE (5 MG/ML) 0.5% IN NEBU
2.5000 mg | INHALATION_SOLUTION | RESPIRATORY_TRACT | Status: DC | PRN
  Administered 2011-12-17 – 2011-12-18 (×4): 2.5 mg via RESPIRATORY_TRACT
  Filled 2011-12-16 (×4): qty 0.5

## 2011-12-16 MED ORDER — COLESEVELAM HCL 625 MG PO TABS
1875.0000 mg | ORAL_TABLET | Freq: Two times a day (BID) | ORAL | Status: DC
  Administered 2011-12-17 – 2011-12-20 (×7): 1875 mg via ORAL
  Filled 2011-12-16 (×8): qty 3

## 2011-12-16 MED ORDER — AZITHROMYCIN 250 MG PO TABS: ORAL_TABLET | ORAL | Status: DC

## 2011-12-16 MED ORDER — ACETAMINOPHEN 500 MG PO TABS
500.0000 mg | ORAL_TABLET | Freq: Once | ORAL | Status: AC
  Administered 2011-12-17: 500 mg via ORAL

## 2011-12-16 MED ORDER — ACETAMINOPHEN 500 MG PO TABS
500.0000 mg | ORAL_TABLET | Freq: Once | ORAL | Status: AC
  Administered 2011-12-16: 500 mg via ORAL

## 2011-12-16 MED ORDER — ASPIRIN EC 81 MG PO TBEC
81.0000 mg | DELAYED_RELEASE_TABLET | Freq: Every day | ORAL | Status: DC
  Administered 2011-12-17 – 2011-12-20 (×4): 81 mg via ORAL
  Filled 2011-12-16 (×4): qty 1

## 2011-12-16 MED ORDER — ACETAMINOPHEN 500 MG PO TABS
1000.0000 mg | ORAL_TABLET | Freq: Four times a day (QID) | ORAL | Status: DC | PRN
  Administered 2011-12-17 – 2011-12-19 (×3): 1000 mg via ORAL
  Filled 2011-12-16 (×4): qty 2

## 2011-12-16 MED ORDER — SODIUM CHLORIDE 0.9 % IJ SOLN
3.0000 mL | Freq: Two times a day (BID) | INTRAMUSCULAR | Status: DC
  Administered 2011-12-17: 3 mL via INTRAVENOUS

## 2011-12-16 MED ORDER — SODIUM CHLORIDE 0.9 % IV SOLN
1000.0000 mL | Freq: Once | INTRAVENOUS | Status: AC
  Administered 2011-12-16: 1000 mL via INTRAVENOUS

## 2011-12-16 MED ORDER — ATORVASTATIN CALCIUM 10 MG PO TABS: 10.0000 mg | ORAL_TABLET | Freq: Every day | ORAL | Status: DC

## 2011-12-16 MED ORDER — AZITHROMYCIN 500 MG PO TABS
500.0000 mg | ORAL_TABLET | Freq: Once | ORAL | Status: DC
  Filled 2011-12-16: qty 1

## 2011-12-16 MED ORDER — CEFTRIAXONE SODIUM IN DEXTROSE 40 MG/ML IV SOLN
1.0000 g | INTRAVENOUS | Status: AC
  Administered 2011-12-16: 1 g via INTRAVENOUS
  Filled 2011-12-16: qty 50

## 2011-12-16 MED ORDER — AZITHROMYCIN 250 MG PO TABS: 250.0000 mg | ORAL_TABLET | ORAL | Status: DC

## 2011-12-16 MED ORDER — ONDANSETRON HCL 4 MG/2ML IJ SOLN: 4.0000 mg | Freq: Four times a day (QID) | INTRAMUSCULAR | Status: DC | PRN

## 2011-12-16 MED ORDER — AMITRIPTYLINE HCL 25 MG PO TABS
25.0000 mg | ORAL_TABLET | Freq: Every day | ORAL | Status: DC
  Administered 2011-12-17 – 2011-12-19 (×4): 25 mg via ORAL
  Filled 2011-12-16 (×5): qty 1

## 2011-12-16 MED ORDER — ACETAMINOPHEN 500 MG PO TABS: 500.0000 mg | ORAL_TABLET | Freq: Once | ORAL | Status: DC

## 2011-12-16 MED ORDER — DEXTROSE 5 % IV SOLN
500.0000 mg | INTRAVENOUS | Status: DC
  Filled 2011-12-16: qty 500

## 2011-12-16 MED ORDER — ONDANSETRON HCL 4 MG PO TABS: 4.0000 mg | ORAL_TABLET | Freq: Four times a day (QID) | ORAL | Status: DC | PRN

## 2011-12-16 MED ORDER — SODIUM CHLORIDE 0.9 % IJ SOLN: 3.0000 mL | INTRAMUSCULAR | Status: DC | PRN

## 2011-12-16 MED ORDER — ENOXAPARIN SODIUM 40 MG/0.4ML ~~LOC~~ SOLN
40.0000 mg | Freq: Every day | SUBCUTANEOUS | Status: DC
  Administered 2011-12-17 – 2011-12-19 (×4): 40 mg via SUBCUTANEOUS
  Filled 2011-12-16 (×5): qty 0.4

## 2011-12-16 MED ORDER — POTASSIUM CHLORIDE CRYS ER 20 MEQ PO TBCR
40.0000 meq | EXTENDED_RELEASE_TABLET | Freq: Every day | ORAL | Status: DC
  Administered 2011-12-16 – 2011-12-20 (×5): 40 meq via ORAL
  Filled 2011-12-16 (×5): qty 2

## 2011-12-16 MED ORDER — ALBUTEROL SULFATE (5 MG/ML) 0.5% IN NEBU
2.5000 mg | INHALATION_SOLUTION | Freq: Once | RESPIRATORY_TRACT | Status: AC
  Administered 2011-12-16: 2.5 mg via RESPIRATORY_TRACT
  Filled 2011-12-16: qty 0.5

## 2011-12-16 MED ORDER — AZITHROMYCIN 250 MG PO TABS: 250.0000 mg | ORAL_TABLET | Freq: Every day | ORAL | Status: DC

## 2011-12-16 MED ORDER — ATORVASTATIN CALCIUM 10 MG PO TABS
10.0000 mg | ORAL_TABLET | Freq: Every day | ORAL | Status: DC
  Filled 2011-12-16 (×2): qty 1

## 2011-12-16 MED ORDER — CEFTRIAXONE SODIUM 1 G IJ SOLR: 1.0000 g | Freq: Once | INTRAMUSCULAR | Status: DC

## 2011-12-16 MED ORDER — AZITHROMYCIN 500 MG IV SOLR
500.0000 mg | Freq: Once | INTRAVENOUS | Status: AC
  Administered 2011-12-16 (×2): 500 mg via INTRAVENOUS
  Filled 2011-12-16: qty 500

## 2011-12-16 MED ORDER — SODIUM CHLORIDE 0.9 % IJ SOLN
3.0000 mL | Freq: Two times a day (BID) | INTRAMUSCULAR | Status: DC
  Administered 2011-12-16 – 2011-12-19 (×7): 3 mL via INTRAVENOUS

## 2011-12-16 MED ORDER — ALBUTEROL SULFATE (5 MG/ML) 0.5% IN NEBU
2.5000 mg | INHALATION_SOLUTION | Freq: Four times a day (QID) | RESPIRATORY_TRACT | Status: DC
  Administered 2011-12-16 – 2011-12-17 (×2): 2.5 mg via RESPIRATORY_TRACT
  Filled 2011-12-16 (×2): qty 0.5

## 2011-12-16 MED ORDER — CEFTRIAXONE SODIUM 1 G IJ SOLR
1.0000 g | Freq: Once | INTRAMUSCULAR | Status: AC
  Administered 2011-12-16: 1 g via INTRAMUSCULAR

## 2011-12-16 MED ORDER — WHITE PETROLATUM GEL
Status: AC
  Administered 2011-12-16: 1
  Filled 2011-12-16: qty 5

## 2011-12-16 MED ORDER — METHYLPREDNISOLONE SODIUM SUCC 125 MG IJ SOLR
125.0000 mg | Freq: Once | INTRAMUSCULAR | Status: AC
  Administered 2011-12-16: 125 mg via INTRAVENOUS
  Filled 2011-12-16: qty 2

## 2011-12-16 MED ORDER — SODIUM CHLORIDE 0.9 % IV SOLN: 1000.0000 mL | INTRAVENOUS | Status: DC

## 2011-12-16 MED ORDER — SODIUM CHLORIDE 0.9 % IV SOLN
250.0000 mL | INTRAVENOUS | Status: DC | PRN
  Administered 2011-12-17: 250 mL via INTRAVENOUS

## 2011-12-16 MED ORDER — DEXTROSE 5 % IV SOLN
1.0000 g | INTRAVENOUS | Status: DC
  Administered 2011-12-17: 1 g via INTRAVENOUS
  Filled 2011-12-16: qty 10
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
## 2011-12-16 NOTE — ED Notes (Signed)
Attempted to call report to 4 W. RN unable to take report at this time. Will call back.  

## 2011-12-16 NOTE — ED Notes (Signed)
C/o flu like s/s x5 days ,..had flu shot

## 2011-12-16 NOTE — Assessment & Plan Note (Addendum)
No focal lung findings.  With story anticipate PNA. CXR today - LUL and lingular PNA on my read. Treat as CAP with shot of CTX in office as well as zpack prescribed. Update [place] if not improving as expected.  ==> upon discussion of results of workup and PNA, pt exhibiting increasing confusion, AMS according to daughter, temperature up to 105.1, continues tachycardic.  rec eval at ER and possible admission - concern for sepsis.  Spoke with charge nurse advising pt on her way.  Received 1gm tylenol at 2:30pm and 1gm CTX IM in office.

## 2011-12-16 NOTE — H&P (Signed)
PCP:   Roxy Manns, MD, MD   Chief Complaint: Progressive shortness of breath and thick brown sputum productive cough.    HPI: Beth Fisher is an 61 y.o. female with history of fibromyalgia, hypertension, presents to [facility] long emergency room with thick brown sputum productive cough for 4 days along with increased shortness of breath. He she had upper respiratory infection symptoms on-and-off since January. This time around her illness began about 4 days ago. She also has chills and subjective fever along with some myalgia. Today she also was confused during a temperature spike up to 103. Evaluation in emergency room included a normal white count, low potassium of 3.2, normal renal function tests. Her chest x-ray showed multifocal infiltrate. She also was found to be originally hypotensive, code sepsis was called, but she responded to intravenous fluid with resulting blood pressure of 130/70. Hospitalist was asked to admit her for community-acquired pneumonia.  Rewiew of Systems:  The patient denies anorexia, weight loss,, vision loss, decreased hearing, hoarseness, chest pain, syncope,  peripheral edema, balance deficits, hemoptysis, abdominal pain, melena, hematochezia, severe indigestion/heartburn, hematuria, incontinence, genital sores, muscle weakness, suspicious skin lesions, transient blindness, difficulty walking, depression, unusual weight change, abnormal bleeding, enlarged lymph nodes, angioedema, and breast masses.   Past Medical History  Diagnosis Date  . Ulcer   . Hypertension   . Hyperlipidemia   . History of UTI   . Fibromyalgia   . Inflammatory polyps of colon     08/2008 OK 08/2010 Ok  byk Dr Kinnie Scales  . Pneumonia 12/2011    Past Surgical History  Procedure Date  . Hernia repair 1978    Medications:  HOME MEDS: Prior to Admission medications   Medication Sig Start Date End Date Taking? Authorizing Provider  acetaminophen (TYLENOL) 500 MG tablet Take 1,000 mg by  mouth every 6 (six) hours as needed. pain   Yes Historical Provider, MD  amitriptyline (ELAVIL) 25 MG tablet Take 1 tablet (25 mg total) by mouth at bedtime. 04/27/11  Yes Roxy Manns, MD  aspirin-sod bicarb-citric acid (ALKA-SELTZER) 325 MG TBEF Take 650 mg by mouth every 6 (six) hours as needed. Cold symptons   Yes Historical Provider, MD  colesevelam (WELCHOL) 625 MG tablet Take 1,875 mg by mouth 2 (two) times daily with a meal. Take 3 tablets by mouth twice a day. 04/27/11  Yes Roxy Manns, MD  famotidine (ACID REDUCER) 10 MG tablet Take 10 mg by mouth daily as needed. OTC as directed.   Yes Historical Provider, MD  fenofibrate micronized (ANTARA) 130 MG capsule Take 1 capsule (130 mg total) by mouth daily before breakfast. 04/27/11  Yes Roxy Manns, MD  [facility] CAPS Take by mouth. 1 capsule by mouth daily (?Mg)    Yes Historical Provider, MD  Multiple Vitamin (MULTIVITAMIN) capsule Take 1 capsule by mouth daily.     Yes Historical Provider, MD  olmesartan (BENICAR) 20 MG tablet Take 1 tablet (20 mg total) by mouth daily. 04/27/11  Yes Roxy Manns, MD  Prenatal MV-Min-Fe Cbn-FA-DHA Albertha Ghee PRENATAL PO) Take 300 mg by mouth daily.     Yes Historical Provider, MD  rosuvastatin (CRESTOR) 10 MG tablet Take 10 mg by mouth See admin instructions. Takes 1 tablet by mouth on Mon and Thur of each week. 04/27/11  Yes Roxy Manns, MD  azithromycin (ZITHROMAX) 250 MG tablet Take 250 mg by mouth See admin instructions. Two on day 1 followed by one daily for 4 days for total of 5 days, PO  12/16/11 12/21/11  Eustaquio Boyden, MD     Allergies:  Allergies  Allergen Reactions  . Zetia (Ezetimibe) Other (See Comments)    Muscle aches!!!    Social History:   reports that she quit smoking about 31 years ago. She does not have any smokeless tobacco history on file. Her alcohol and drug histories not on file.  Family History: Family History  Problem Relation Age of Onset  . Alcohol abuse Mother   .  Hyperlipidemia Mother   . Alzheimer's disease Mother   . Cancer Mother     breast cancer  . Diabetes Father   . Heart disease Father   . Hyperlipidemia Father   . Mental illness Father   . Alzheimer's disease Father   . Cancer Paternal Grandmother     colon  . Diabetes Paternal Grandmother   . Heart disease Cousin     sudden death less than 54 yrs old heart attack.     Physical Exam: Filed Vitals:   12/16/11 1644 12/16/11 1653 12/16/11 1942  BP:  131/69 120/67  Pulse:  102 95  Temp:  100.1 F (37.8 C) 99.3 F (37.4 C)  TempSrc:  Oral Oral  Resp:  18 23  SpO2: 96% 99% 97%   Blood pressure 120/67, pulse 95, temperature 99.3 F (37.4 C), temperature source Oral, resp. rate 23, last menstrual period 10/11/2000, SpO2 97.00%.  GEN:  Pleasant  person lying in the stretcher in no acute distress; cooperative with exam PSYCH:  alert and oriented x4; does not appear anxious does not appear depressed; affect is normal HEENT: Mucous membranes pink and anicteric; PERRLA; EOM intact; no cervical lymphadenopathy nor thyromegaly or carotid bruit; no JVD; Breasts:: Not examined CHEST WALL: No tenderness CHEST: Normal respiration, but with both inspiratory wheezes and crackles heard on her left mid lung field. HEART: Regular rate and rhythm; no murmurs rubs or gallops BACK: No kyphosis or scoliosis; no CVA tenderness ABDOMEN: Obese, soft non-tender; no masses, no organomegaly, normal abdominal bowel sounds; no pannus; no intertriginous candida. Rectal Exam: Not done EXTREMITIES: No bone or joint deformity; age-appropriate arthropathy of the hands and knees; no edema; no ulcerations. Genitalia: not examined PULSES: 2+ and symmetric SKIN: Normal hydration no rash or ulceration CNS: Cranial nerves 2-12 grossly intact no focal neurologic deficit   Labs & Imaging Results for orders placed during the hospital encounter of 12/16/11 (from the past 48 hour(s))  LACTIC ACID, PLASMA     Status:  Normal   Collection Time   12/16/11  4:56 PM      Component Value Range Comment   Lactic Acid, Venous 1.6  0.5 - 2.2 (mmol/L)   PROCALCITONIN     Status: Normal   Collection Time   12/16/11  4:56 PM      Component Value Range Comment   Procalcitonin 1.88     CBC     Status: Abnormal   Collection Time   12/16/11  4:58 PM      Component Value Range Comment   WBC 10.1  4.0 - 10.5 (K/uL)    RBC 3.98  3.87 - 5.11 (MIL/uL)    Hemoglobin 11.9 (*) 12.0 - 15.0 (g/dL)    HCT 57.8 (*) 46.9 - 46.0 (%)    MCV 86.7  78.0 - 100.0 (fL)    MCH 29.9  26.0 - 34.0 (pg)    MCHC 34.5  30.0 - 36.0 (g/dL)    RDW 62.9  52.8 - 41.3 (%)  Platelets 221  150 - 400 (K/uL)   DIFFERENTIAL     Status: Abnormal   Collection Time   12/16/11  4:58 PM      Component Value Range Comment   Neutrophils Relative 85 (*) 43 - 77 (%)    Lymphocytes Relative 9 (*) 12 - 46 (%)    Monocytes Relative 6  3 - 12 (%)    Eosinophils Relative 0  0 - 5 (%)    Basophils Relative 0  0 - 1 (%)    Neutro Abs 8.6 (*) 1.7 - 7.7 (K/uL)    Lymphs Abs 0.9  0.7 - 4.0 (K/uL)    Monocytes Absolute 0.6  0.1 - 1.0 (K/uL)    Eosinophils Absolute 0.0  0.0 - 0.7 (K/uL)    Basophils Absolute 0.0  0.0 - 0.1 (K/uL)    WBC Morphology MILD LEFT SHIFT (1-5% METAS, OCC MYELO, OCC BANDS)   INCREASED BANDS (>20% BANDS)   Smear Review        Value: PLATELET CLUMPS NOTED ON SMEAR, COUNT APPEARS ADEQUATE  COMPREHENSIVE METABOLIC PANEL     Status: Abnormal   Collection Time   12/16/11  4:58 PM      Component Value Range Comment   Sodium 135  135 - 145 (mEq/L)    Potassium 3.2 (*) 3.5 - 5.1 (mEq/L)    Chloride 104  96 - 112 (mEq/L)    CO2 20  19 - 32 (mEq/L)    Glucose, Bld 97  70 - 99 (mg/dL)    BUN 12  6 - 23 (mg/dL)    Creatinine, Ser 4.54  0.50 - 1.10 (mg/dL)    Calcium 8.7  8.4 - 10.5 (mg/dL)    Total Protein 6.9  6.0 - 8.3 (g/dL)    Albumin 3.8  3.5 - 5.2 (g/dL)    AST 22  0 - 37 (U/L)    ALT 22  0 - 35 (U/L)    Alkaline Phosphatase 46  39 - 117  (U/L)    Total Bilirubin 0.6  0.3 - 1.2 (mg/dL)    GFR calc non Af Amer 74 (*) >90 (mL/min)    GFR calc Af Amer 85 (*) >90 (mL/min)   URINALYSIS, ROUTINE W REFLEX MICROSCOPIC     Status: Abnormal   Collection Time   12/16/11  5:13 PM      Component Value Range Comment   Color, Urine YELLOW  YELLOW     APPearance CLEAR  CLEAR     Specific Gravity, Urine 1.013  1.005 - 1.030     pH 6.0  5.0 - 8.0     Glucose, UA NEGATIVE  NEGATIVE (mg/dL)    Hgb urine dipstick NEGATIVE  NEGATIVE     Bilirubin Urine NEGATIVE  NEGATIVE     Ketones, ur NEGATIVE  NEGATIVE (mg/dL)    Protein, ur NEGATIVE  NEGATIVE (mg/dL)    Urobilinogen, UA 0.2  0.0 - 1.0 (mg/dL)    Nitrite POSITIVE (*) NEGATIVE     Leukocytes, UA NEGATIVE  NEGATIVE    URINE MICROSCOPIC-ADD ON     Status: Abnormal   Collection Time   12/16/11  5:13 PM      Component Value Range Comment   Squamous Epithelial / LPF RARE  RARE     WBC, UA 0-2  <3 (WBC/hpf)    Bacteria, UA FEW (*) RARE     Dg Chest 2 View  12/16/2011  *RADIOLOGY REPORT*  Clinical Data: Fever, productive  cough, weakness  CHEST - 2 VIEW  Comparison: None.  Findings: Patchy opacities in the left upper lobe, lingula, and possibly the right upper lobe, suspicious for pneumonia. No pleural effusion or pneumothorax.  Cardiomediastinal silhouette is within normal limits.  Mild degenerative changes of the visualized thoracolumbar spine.  IMPRESSION: Multifocal patchy opacities, suspicious for pneumonia.  Original Report Authenticated By: Charline Bills, M.D.      Assessment Community-acquired pneumonia Hypotension Bronchospasm Fibromyalgia Hyperlipidemia  PLAN: Will admit her to telemetry and continue Rocephin and Zithromax. Blood cultures have been done. If she is no longer hypotensive Will discontinue IV fluid. For now will hold her ACE inhibitor, but will continue her Crestor at low dose. She will require nebulizer treatments. She is doing better after 2 L of fluid in the  emergency room, and I will admit her to telemetry. She is stable, full code, and will be admitted to triad hospitalist service [facility] team 3.   Other plans as per orders.   Kendan Cornforth 12/16/2011, 9:35 PM

## 2011-12-16 NOTE — Patient Instructions (Addendum)
To ER today for further evaluation. I have called and they should be expecting you there.

## 2011-12-16 NOTE — Telephone Encounter (Signed)
Triage Record Num: [id number] Operator: Valene Bors Patient Name: Beth Fisher Call Date & Time: 12/16/2011 11:32:36AM Patient Phone: [phone] PCP: Audrie Gallus. Tower Patient Gender: Female PCP Fax : Patient DOB: [birth year] Practice Name: [facility] Reason for Call: Caller: Laura/Child; PCP: Roxy Manns A.; CB#: [phone]; Call regarding Cough/Congestion- she is coughing up greenish brown sputum and side of face hurts, R ear and R jaw, and throat feels raw. She is having chills-fever per tactile- taking Acetaminophen and Alkaselter q 4 hr prn. Onset of sx in Jan 2013 - cough and sore throat that never completely went away; Sx worse since 12/13/11 and she is short of breath at times and feels weak and tired. She is able to get up still doing some ADL's s/a using BR and taking dog out, but has some dizziness at times that clears in a min or two. Daughter will bring her to appnt at office at 2 pm. Care advice per Cough Protocol. Protocol(s) Used: Cough - Adult Recommended Outcome per Protocol: See Provider within 4 hours Reason for Outcome: New onset or worsening cough AND temperature of 101.5 F (38.6C) or greater Care Advice: ~ Another adult should drive. ~ Call provider if symptoms worsen or new symptoms develop. During pregnancy, call provider if temperature is 100 F (37.7 C) or greater OR any temperature elevation for 3 days even while taking acetaminophen. ~ ~ SYMPTOM / CONDITION MANAGEMENT ~ CAUTIONS ~ List, or take, all current prescription(s), nonprescription or alternative medication(s) to provider for evaluation. Most adults need to drink 6-10 eight-ounce glasses (1.2-2.0 liters) of fluids per day unless previously told to limit fluid intake for other medical reasons. Limit fluids that contain caffeine, sugar or alcohol. Urine will be a very light yellow color when you drink enough fluids. ~ Analgesic/Antipyretic Advice - Acetaminophen: Consider acetaminophen as  directed on label or by pharmacist/provider for pain or fever PRECAUTIONS: - Use if there is no history of liver disease, alcoholism, or intake of three or more alcohol drinks per day - Only if approved by provider during pregnancy or when breastfeeding - During pregnancy, acetaminophen should not be taken more than 3 consecutive days without telling provider - Do not exceed recommended dose or frequency ~ Systemic Inflammatory Response Syndrome (SIRS): Watch for signs of a generalized, whole body infection. Occurs within days of a localized infection, especially of the urinary, GI, respiratory or nervous systems; or after a traumatic injury or invasive procedure. - Call EMS 911 if symptoms have worsened, such as increasing confusion or unusual drowsiness; cold and clammy skin; no urine output; rapid respiration (>30/min.) or slow respiration (<10/min.); struggling to breathe. - Go to the ED immediately for early symptoms of rapid pulse >90/min. or rapid breathing >20/min. at rest; chills; oral temperature >100.4 F (38 C) or <96.8 F (36 C) when associated with conditions noted. ~ 12/16/2011 11:59:11AM Page 1 of 1 [id number]

## 2011-12-16 NOTE — ED Provider Notes (Signed)
History     CSN: [id number]  Arrival date & time 12/16/11  1632   First MD Initiated Contact with Patient 12/16/11 1645      Chief Complaint  Patient presents with  . Fever    (Consider location/radiation/quality/duration/timing/severity/associated sxs/prior treatment) HPI This 61 year old female has 4-5 days of fever cough shortness of breath at times and now he feels generally weak with lightheadedness and had transient confusion at her primary care doctor's office prior to arrival to the ED today. She was diagnosed with pneumonia by chest x-ray the primary care doctor's office and received 1 g of Rocephin IM prior to transferring the patient to the ED. The patient is no longer confused but was tachycardic with systolic pressure in the 90s and pulse rate in the 120s at her doctor's office prior to arrival. She also has some nasal congestion and right ear pain. She has no severe headache, no confusion now, no rash, no abdominal pain, vomiting, or diarrhea. She has no lateralizing weakness or numbness just generalized weakness with fatigue and lightheadedness today. She received 500 mL saline bolus IV by EMS prior to arrival today. She received Tylenol at her primary care doctor's office prior to arrival today. Past Medical History  Diagnosis Date  . Ulcer   . Hypertension   . Hyperlipidemia   . History of UTI   . Fibromyalgia   . Inflammatory polyps of colon     08/2008 OK 08/2010 Ok  byk Dr Kinnie Scales  . Pneumonia 12/2011    Past Surgical History  Procedure Date  . Hernia repair 1978    Family History  Problem Relation Age of Onset  . Alcohol abuse Mother   . Hyperlipidemia Mother   . Alzheimer's disease Mother   . Cancer Mother     breast cancer  . Diabetes Father   . Heart disease Father   . Hyperlipidemia Father   . Mental illness Father   . Alzheimer's disease Father   . Cancer Paternal Grandmother     colon  . Diabetes Paternal Grandmother   . Heart disease Cousin       sudden death less than 61 yrs old heart attack.    History  Substance Use Topics  . Smoking status: Former Smoker    Quit date: 10/11/1980  . Smokeless tobacco: Not on file  . Alcohol Use: Not on file    OB History    Grav Para Term Preterm Abortions TAB SAB Ect Mult Living                  Review of Systems  Constitutional: Positive for fever.       10 Systems reviewed and are negative for acute change except as noted in the HPI.  HENT: Positive for congestion.   Eyes: Negative for discharge and redness.  Respiratory: Positive for cough and shortness of breath.   Cardiovascular: Negative for chest pain.  Gastrointestinal: Negative for vomiting and abdominal pain.  Musculoskeletal: Negative for back pain.  Skin: Negative for rash.  Neurological: Positive for weakness and light-headedness. Negative for syncope, numbness and headaches.  Psychiatric/Behavioral:       No behavior change.    Allergies  Zetia  Home Medications   No current outpatient prescriptions on file.  BP 116/77  Pulse 84  Temp(Src) 98.1 F (36.7 C) (Oral)  Resp 20  Ht 5\' 7"  (1.702 m)  Wt 233 lb 1.6 oz (105.733 kg)  BMI 36.51 kg/m2  SpO2 95%  LMP 10/11/2000  Physical Exam  Nursing note and vitals reviewed. Constitutional: She is oriented to person, place, and time.       Awake, alert, nontoxic appearance.  HENT:  Head: Atraumatic.       Dry oral mucosa  Eyes: Conjunctivae are normal. Pupils are equal, round, and reactive to light. Right eye exhibits no discharge. Left eye exhibits no discharge.  Neck: Neck supple.  Cardiovascular: Regular rhythm.   No murmur heard.      Tachycardic  Pulmonary/Chest: Effort normal. No respiratory distress. She has wheezes. She has no rales. She exhibits no tenderness.       The patient speaks full sentences with mild tachypnea with faint scattered wheezes with no crackles no rhonchi no retractions no accessory muscle usage  Abdominal: Soft. There is  no tenderness. There is no rebound.  Musculoskeletal: She exhibits no edema and no tenderness.       Baseline ROM, no obvious new focal weakness.  Neurological: She is alert and oriented to person, place, and time.       Mental status and motor strength appears baseline for patient and situation.  Skin: No rash noted.  Psychiatric: She has a normal mood and affect.    ED Course  Procedures (including critical care time)  Labs Reviewed  CBC - Abnormal; Notable for the following:    Hemoglobin 11.9 (*)    HCT 34.5 (*)    All other components within normal limits  DIFFERENTIAL - Abnormal; Notable for the following:    Neutrophils Relative 85 (*)    Lymphocytes Relative 9 (*)    Neutro Abs 8.6 (*)    All other components within normal limits  COMPREHENSIVE METABOLIC PANEL - Abnormal; Notable for the following:    Potassium 3.2 (*)    GFR calc non Af Amer 74 (*)    GFR calc Af Amer 85 (*)    All other components within normal limits  URINALYSIS, ROUTINE W REFLEX MICROSCOPIC - Abnormal; Notable for the following:    Nitrite POSITIVE (*)    All other components within normal limits  URINE MICROSCOPIC-ADD ON - Abnormal; Notable for the following:    Bacteria, UA FEW (*)    All other components within normal limits  BASIC METABOLIC PANEL - Abnormal; Notable for the following:    Glucose, Bld 149 (*)    GFR calc non Af Amer 89 (*)    All other components within normal limits  CBC - Abnormal; Notable for the following:    WBC 12.2 (*)    RBC 3.82 (*)    Hemoglobin 11.3 (*)    HCT 33.2 (*)    All other components within normal limits  LACTIC ACID, PLASMA  PROCALCITONIN  INFLUENZA PANEL BY PCR  CULTURE, BLOOD (ROUTINE X 2)  CULTURE, BLOOD (ROUTINE X 2)  URINE CULTURE  HIV ANTIBODY (ROUTINE TESTING)  CBC  BASIC METABOLIC PANEL  LEGIONELLA ANTIGEN, URINE  STREP PNEUMONIAE URINARY ANTIGEN   Dg Chest 2 View  12/16/2011  *RADIOLOGY REPORT*  Clinical Data: Fever, productive cough,  weakness  CHEST - 2 VIEW  Comparison: None.  Findings: Patchy opacities in the left upper lobe, lingula, and possibly the right upper lobe, suspicious for pneumonia. No pleural effusion or pneumothorax.  Cardiomediastinal silhouette is within normal limits.  Mild degenerative changes of the visualized thoracolumbar spine.  IMPRESSION: Multifocal patchy opacities, suspicious for pneumonia.  Original Report Authenticated By: Charline Bills, M.D.     1. Community  acquired pneumonia   2. PNA (pneumonia)   3. Fever       MDM  Patient / Family / Caregiver understand and agree with initial ED impression and plan with expectations set for ED visit.The patient appears reasonably stabilized for admission considering the current resources, flow, and capabilities available in the ED at this time, and I doubt any other [facility] requiring further screening and/or treatment in the ED prior to admission.        Hurman Horn, MD 12/17/11 2126

## 2011-12-16 NOTE — ED Notes (Signed)
MD at bedside. Dr. Le, hospitalist, at bedside.  

## 2011-12-16 NOTE — Progress Notes (Signed)
  Subjective:    Patient ID: Beth Fisher, female    DOB: [birth year], 61 y.o.   MRN: [id number]  HPI CC: cough, fever, weakness  4d h/o ear pain, HA, ST.  Productive cough started 4d ago as well, has gotten progressively worse.  Green/brown sputum.  Tmax 103.  Now feeling weak as well.  Over last 24 hours significant worsening.  + more SOB, dizzy, HA currently.  Nauseated.  Pleuritic chest pain.  11/09/2011 with viral URTI, treated supportively with mucinex, tylenol.  Improved, then worsened again.  Has also been using alka seltzer.  No abd pain, sinus congestion, rashes.  + sick contacts at home recently. Husband recently with walking pneumonia. Not on abx recently.  Mother in nursing home, visits weekly.  No smokers at home.  No h/o asthma, COPD.  On benicar.  Thinks staying well hydrated.  Review of Systems Per HPI    Objective:   Physical Exam  Nursing note and vitals reviewed. Constitutional: She appears well-developed and well-nourished. No distress.  HENT:  Head: Normocephalic and atraumatic.  Right Ear: Hearing, tympanic membrane, external ear and ear canal normal.  Left Ear: Hearing, tympanic membrane, external ear and ear canal normal.  Nose: No mucosal edema or rhinorrhea. Right sinus exhibits no maxillary sinus tenderness and no frontal sinus tenderness. Left sinus exhibits no maxillary sinus tenderness and no frontal sinus tenderness.  Mouth/Throat: Uvula is midline and mucous membranes are normal. Posterior oropharyngeal erythema present. No oropharyngeal exudate, posterior oropharyngeal edema or tonsillar abscesses.  Eyes: Conjunctivae and EOM are normal. Pupils are equal, round, and reactive to light. No scleral icterus.  Neck: Normal range of motion. Neck supple.  Cardiovascular: Normal rate, regular rhythm, normal heart sounds and intact distal pulses.   No murmur heard. Pulmonary/Chest: Breath sounds normal. No respiratory distress (SOB with complete  sentences). She has no wheezes. She has no rales.       Somewhat decreased air movement bases of lung. Coarse breath sounds L>R apices  Musculoskeletal: She exhibits no edema.  Lymphadenopathy:    She has no cervical adenopathy.  Skin: Skin is warm and dry. No rash noted.       Assessment & Plan:

## 2011-12-17 LAB — BASIC METABOLIC PANEL
BUN: 11 mg/dL (ref 6–23)
CO2: 22 mEq/L (ref 19–32)
Calcium: 9 mg/dL (ref 8.4–10.5)
Chloride: 109 mEq/L (ref 96–112)
Creatinine, Ser: 0.76 mg/dL (ref 0.50–1.10)
GFR calc Af Amer: >90 mL/min (ref >90)
GFR calc non Af Amer: 89 mL/min — ABNORMAL LOW (ref >90)
Glucose, Bld: 149 mg/dL — ABNORMAL HIGH (ref 70–99)
Potassium: 3.8 mEq/L (ref 3.5–5.1)
Sodium: 139 mEq/L (ref 135–145)

## 2011-12-17 LAB — INFLUENZA PANEL BY PCR (TYPE A & B)
H1N1 flu by pcr: NOT DETECTED
Influenza A By PCR: NEGATIVE
Influenza B By PCR: NEGATIVE

## 2011-12-17 LAB — CBC
HCT: 33.2 % — ABNORMAL LOW (ref 36.0–46.0)
Hemoglobin: 11.3 g/dL — ABNORMAL LOW (ref 12.0–15.0)
MCH: 29.6 pg (ref 26.0–34.0)
MCHC: 34 g/dL (ref 30.0–36.0)
MCV: 86.9 fL (ref 78.0–100.0)
Platelets: 207 10*3/uL (ref 150–400)
RBC: 3.82 MIL/uL — ABNORMAL LOW (ref 3.87–5.11)
RDW: 13.2 % (ref 11.5–15.5)
WBC: 12.2 10*3/uL — ABNORMAL HIGH (ref 4.0–10.5)

## 2011-12-17 MED ORDER — ROSUVASTATIN CALCIUM 10 MG PO TABS
10.0000 mg | ORAL_TABLET | ORAL | Status: DC
  Filled 2011-12-17: qty 1

## 2011-12-17 MED ORDER — CEFUROXIME AXETIL 500 MG PO TABS
500.0000 mg | ORAL_TABLET | Freq: Two times a day (BID) | ORAL | Status: DC
  Administered 2011-12-17 – 2011-12-20 (×6): 500 mg via ORAL
  Filled 2011-12-17 (×7): qty 1

## 2011-12-17 MED ORDER — NON FORMULARY: 10.0000 mg | Status: DC

## 2011-12-17 MED ORDER — AZITHROMYCIN 250 MG PO TABS
250.0000 mg | ORAL_TABLET | Freq: Every day | ORAL | Status: DC
  Administered 2011-12-17 – 2011-12-20 (×4): 250 mg via ORAL
  Filled 2011-12-17 (×4): qty 1

## 2011-12-17 NOTE — Progress Notes (Signed)
Subjective: Her breathing is better today than yesterday.  No other specific complaints.  Objective: Vital signs in last 24 hours: Filed Vitals:   12/17/11 0548 12/17/11 0948 12/17/11 1058 12/17/11 1415  BP: 101/61  113/71 116/77  Pulse: 71  82 84  Temp: 97.8 F (36.6 C)  98 F (36.7 C) 98.1 F (36.7 C)  TempSrc: Oral  Oral Oral  Resp: 20  20 20   Height:      Weight: 105.733 kg (233 lb 1.6 oz)     SpO2: 92% 96%  95%   Weight change:   Intake/Output Summary (Last 24 hours) at 12/17/11 1743 Last data filed at 12/17/11 1300  Gross per 24 hour  Intake    483 ml  Output   1025 ml  Net   -542 ml    Physical Exam: General: Awake, Oriented, No acute distress. HEENT: EOMI. Neck: Supple CV: S1 and S2 Lungs: Clear to ascultation bilaterally Abdomen: Soft, Nontender, Nondistended, +bowel sounds. Ext: Good pulses. Trace edema.  Lab Results:  Basename 12/17/11 0441 12/16/11 1658  NA 139 135  K 3.8 3.2*  CL 109 104  CO2 22 20  GLUCOSE 149* 97  BUN 11 12  CREATININE 0.76 0.84  CALCIUM 9.0 8.7  MG -- --  PHOS -- --    Basename 12/16/11 1658  AST 22  ALT 22  ALKPHOS 46  BILITOT 0.6  PROT 6.9  ALBUMIN 3.8   No results found for this basename: LIPASE:2,AMYLASE:2 in the last 72 hours  Basename 12/17/11 0441 12/16/11 1658  WBC 12.2* 10.1  NEUTROABS -- 8.6*  HGB 11.3* 11.9*  HCT 33.2* 34.5*  MCV 86.9 86.7  PLT 207 221   No results found for this basename: CKTOTAL:3,CKMB:3,CKMBINDEX:3,TROPONINI:3 in the last 72 hours No components found with this basename: POCBNP:3 No results found for this basename: DDIMER:2 in the last 72 hours No results found for this basename: HGBA1C:2 in the last 72 hours No results found for this basename: CHOL:2,HDL:2,LDLCALC:2,TRIG:2,CHOLHDL:2,LDLDIRECT:2 in the last 72 hours No results found for this basename: TSH,T4TOTAL,FREET3,T3FREE,THYROIDAB in the last 72 hours No results found for this basename:  VITAMINB12:2,FOLATE:2,FERRITIN:2,TIBC:2,IRON:2,RETICCTPCT:2 in the last 72 hours  Micro Results: No results found for this or any previous visit (from the past 240 hour(s)).  Studies/Results: Dg Chest 2 View  12/16/2011  *RADIOLOGY REPORT*  Clinical Data: Fever, productive cough, weakness  CHEST - 2 VIEW  Comparison: None.  Findings: Patchy opacities in the left upper lobe, lingula, and possibly the right upper lobe, suspicious for pneumonia. No pleural effusion or pneumothorax.  Cardiomediastinal silhouette is within normal limits.  Mild degenerative changes of the visualized thoracolumbar spine.  IMPRESSION: Multifocal patchy opacities, suspicious for pneumonia.  Original Report Authenticated By: Charline Bills, M.D.    Medications: I have reviewed the patient's current medications. Scheduled Meds:   . sodium chloride  1,000 mL Intravenous Once  . acetaminophen  500 mg Oral Once  . amitriptyline  25 mg Oral QHS  . aspirin EC  81 mg Oral Daily  . azithromycin  500 mg Intravenous Once  . azithromycin  250 mg Oral Daily  . cefTRIAXone  1 g Intravenous To ER  . cefUROXime  500 mg Oral BID WC  . colesevelam  1,875 mg Oral BID WC  . enoxaparin  40 mg Subcutaneous QHS  . methylPREDNISolone (SOLU-MEDROL) injection  125 mg Intravenous Once  . potassium chloride  40 mEq Oral Daily  . rosuvastatin  10 mg Oral Custom  .  sodium chloride  3 mL Intravenous Q12H  . white petrolatum      . DISCONTD: acetaminophen  500 mg Oral Once  . DISCONTD: albuterol  2.5 mg Nebulization Q6H  . DISCONTD: atorvastatin  10 mg Oral q1800  . DISCONTD: atorvastatin  10 mg Oral q1800  . DISCONTD: azithromycin  500 mg Intravenous Q24H  . DISCONTD: azithromycin  250 mg Oral See admin instructions  . DISCONTD: azithromycin  250 mg Oral Daily  . DISCONTD: azithromycin  500 mg Oral Once  . DISCONTD: cefTRIAXone (ROCEPHIN)  IV  1 g Intravenous Q24H  . DISCONTD: cefTRIAXone (ROCEPHIN) IM  1 g Intramuscular Once  .  DISCONTD: NON FORMULARY 10 mg  10 mg Oral 2 times weekly  . DISCONTD: sodium chloride  3 mL Intravenous Q12H   Continuous Infusions:   . DISCONTD: sodium chloride     PRN Meds:.acetaminophen, albuterol, ondansetron (ZOFRAN) IV, ondansetron, DISCONTD: sodium chloride, DISCONTD: sodium chloride  Assessment/Plan: 1. Acute respiratory failure secondary to community-acquired pneumonia. Influenza PCR negative.  Improved on ceftriaxone and azithromycin, transitioned to cefuroxime and azithromycin.antibiotics since 12/16/2011.  Will send for urine Legionella and urine strep pneumo.  Given atypical presentation will send for HIV antibody.  Discontinue telemetry.  No events noted on telemetry.  2. Hypertension.  Improved.  Now normotensive.  Continue to hold olmesartan.  3. Hyperlipidemia.  Continue statin.  4. Fibromyalgia.  Stable.  5.  Prophylaxis.  Lovenox.  6.  Disposition.  Pending.  If the patient continues to improve over the next 24-48 hours consider discharge.   LOS: 1 day  Godwin Tedesco A, MD 12/17/2011, 5:43 PM

## 2011-12-18 LAB — URINE CULTURE
Colony Count: NO GROWTH
Culture  Setup Time: 201303080341
Culture: NO GROWTH

## 2011-12-18 LAB — CBC
HCT: 30.8 % — ABNORMAL LOW (ref 36.0–46.0)
Hemoglobin: 10.4 g/dL — ABNORMAL LOW (ref 12.0–15.0)
MCH: 29.7 pg (ref 26.0–34.0)
MCHC: 33.8 g/dL (ref 30.0–36.0)
MCV: 88 fL (ref 78.0–100.0)
Platelets: 215 10*3/uL (ref 150–400)
RBC: 3.5 MIL/uL — ABNORMAL LOW (ref 3.87–5.11)
RDW: 13.9 % (ref 11.5–15.5)
WBC: 9 10*3/uL (ref 4.0–10.5)

## 2011-12-18 LAB — BASIC METABOLIC PANEL
BUN: 14 mg/dL (ref 6–23)
CO2: 23 mEq/L (ref 19–32)
Calcium: 9 mg/dL (ref 8.4–10.5)
Chloride: 110 mEq/L (ref 96–112)
Creatinine, Ser: 0.77 mg/dL (ref 0.50–1.10)
GFR calc Af Amer: >90 mL/min (ref >90)
GFR calc non Af Amer: 89 mL/min — ABNORMAL LOW (ref >90)
Glucose, Bld: 101 mg/dL — ABNORMAL HIGH (ref 70–99)
Potassium: 3.9 mEq/L (ref 3.5–5.1)
Sodium: 141 mEq/L (ref 135–145)

## 2011-12-18 LAB — STREP PNEUMONIAE URINARY ANTIGEN: Strep Pneumo Urinary Antigen: NEGATIVE

## 2011-12-18 LAB — HIV ANTIBODY (ROUTINE TESTING W REFLEX): HIV: NONREACTIVE

## 2011-12-18 MED ORDER — METHYLPREDNISOLONE SODIUM SUCC 40 MG IJ SOLR
40.0000 mg | Freq: Four times a day (QID) | INTRAMUSCULAR | Status: AC
  Administered 2011-12-18 – 2011-12-19 (×3): 40 mg via INTRAVENOUS
  Filled 2011-12-18 (×3): qty 1

## 2011-12-18 MED ORDER — PREDNISONE 20 MG PO TABS
40.0000 mg | ORAL_TABLET | Freq: Every day | ORAL | Status: DC
  Administered 2011-12-19 – 2011-12-20 (×2): 40 mg via ORAL
  Filled 2011-12-18 (×2): qty 2

## 2011-12-18 NOTE — Progress Notes (Signed)
Subjective: Breathing has gotten worse today.  Has wheezing with breathing.  Objective: Vital signs in last 24 hours: Filed Vitals:   12/18/11 0459 12/18/11 0546 12/18/11 1007 12/18/11 1427  BP: 115/67 121/76  131/76  Pulse: 79 79  84  Temp: 97.9 F (36.6 C) 98.7 F (37.1 C)  98.5 F (36.9 C)  TempSrc: Oral Oral  Oral  Resp: 18 20  19   Height:      Weight:      SpO2: 94% 96% 97% 96%   Weight change:   Intake/Output Summary (Last 24 hours) at 12/18/11 1551 Last data filed at 12/18/11 1428  Gross per 24 hour  Intake   1080 ml  Output    600 ml  Net    480 ml    Physical Exam: General: Awake, Oriented, No acute distress. HEENT: EOMI. Neck: Supple CV: S1 and S2 Lungs: Scattered wheezing on exam. Abdomen: Soft, Nontender, Nondistended, +bowel sounds. Ext: Good pulses. Trace edema.  Lab Results:  [facility] 12/18/11 0524 12/17/11 0441  NA 141 139  K 3.9 3.8  CL 110 109  CO2 23 22  GLUCOSE 101* 149*  BUN 14 11  CREATININE 0.77 0.76  CALCIUM 9.0 9.0  MG -- --  PHOS -- --    Basename 12/16/11 1658  AST 22  ALT 22  ALKPHOS 46  BILITOT 0.6  PROT 6.9  ALBUMIN 3.8   No results found for this basename: LIPASE:2,AMYLASE:2 in the last 72 hours  Basename 12/18/11 0524 12/17/11 0441 12/16/11 1658  WBC 9.0 12.2* --  NEUTROABS -- -- 8.6*  HGB 10.4* 11.3* --  HCT 30.8* 33.2* --  MCV 88.0 86.9 --  PLT 215 207 --   No results found for this basename: CKTOTAL:3,CKMB:3,CKMBINDEX:3,TROPONINI:3 in the last 72 hours No components found with this basename: POCBNP:3 No results found for this basename: DDIMER:2 in the last 72 hours No results found for this basename: HGBA1C:2 in the last 72 hours No results found for this basename: CHOL:2,HDL:2,LDLCALC:2,TRIG:2,CHOLHDL:2,LDLDIRECT:2 in the last 72 hours No results found for this basename: TSH,T4TOTAL,FREET3,T3FREE,THYROIDAB in the last 72 hours No results found for this basename:  VITAMINB12:2,FOLATE:2,FERRITIN:2,TIBC:2,IRON:2,RETICCTPCT:2 in the last 72 hours  Micro Results: Recent Results (from the past 240 hour(s))  URINE CULTURE     Status: Normal   Collection Time   12/16/11  5:13 PM      Component Value Range Status Comment   Specimen Description URINE, CLEAN CATCH   Final    Special Requests NONE   Final    Culture  Setup Time [id number]   Final    Colony Count NO GROWTH   Final    Culture NO GROWTH   Final    Report Status 12/18/2011 FINAL   Final   CULTURE, BLOOD (ROUTINE X 2)     Status: Normal (Preliminary result)   Collection Time   12/16/11  5:20 PM      Component Value Range Status Comment   Specimen Description BLOOD RIGHT ARM   Final    Special Requests BOTTLES DRAWN AEROBIC AND ANAEROBIC 5CC   Final    Culture  Setup Time [id number]   Final    Culture     Final    Value:        BLOOD CULTURE RECEIVED NO GROWTH TO DATE CULTURE WILL BE HELD FOR 5 DAYS BEFORE ISSUING A FINAL NEGATIVE REPORT   Report Status PENDING   Incomplete   CULTURE, BLOOD (ROUTINE X 2)  Status: Normal (Preliminary result)   Collection Time   12/16/11  5:25 PM      Component Value Range Status Comment   Specimen Description BLOOD RIGHT HAND   Final    Special Requests BOTTLES DRAWN AEROBIC AND ANAEROBIC 5CC   Final    Culture  Setup Time [id number]   Final    Culture     Final    Value:        BLOOD CULTURE RECEIVED NO GROWTH TO DATE CULTURE WILL BE HELD FOR 5 DAYS BEFORE ISSUING A FINAL NEGATIVE REPORT   Report Status PENDING   Incomplete     Studies/Results: No results found.  Medications: I have reviewed the patient's current medications. Scheduled Meds:    . amitriptyline  25 mg Oral QHS  . aspirin EC  81 mg Oral Daily  . azithromycin  250 mg Oral Daily  . cefUROXime  500 mg Oral BID WC  . colesevelam  1,875 mg Oral BID WC  . enoxaparin  40 mg Subcutaneous QHS  . methylPREDNISolone (SOLU-MEDROL) injection  40 mg Intravenous Q6H  . potassium chloride   40 mEq Oral Daily  . predniSONE  40 mg Oral Q breakfast  . rosuvastatin  10 mg Oral Custom  . sodium chloride  3 mL Intravenous Q12H  . DISCONTD: atorvastatin  10 mg Oral q1800  . DISCONTD: azithromycin  500 mg Intravenous Q24H  . DISCONTD: cefTRIAXone (ROCEPHIN)  IV  1 g Intravenous Q24H  . DISCONTD: NON FORMULARY 10 mg  10 mg Oral 2 times weekly  . DISCONTD: sodium chloride  3 mL Intravenous Q12H   Continuous Infusions:  PRN Meds:.acetaminophen, albuterol, ondansetron (ZOFRAN) IV, ondansetron, DISCONTD: sodium chloride, DISCONTD: sodium chloride  Assessment/Plan: 1. Acute respiratory failure secondary to community-acquired pneumonia. Influenza PCR negative.  Improved on ceftriaxone and azithromycin, transitioned to cefuroxime and azithromycin. Antibiotics since 12/16/2011.  HIV antibody nonreactive.  Urine strep pneumo negative.  Urine legionella pending.  Given her wheezing on exam so the patient on steroids.  Likely the patient has reactive airway disease secondary to pneumonia, patient will need outpatient pulmonary function tests.  2. Hypertension.  Improved.  Now normotensive.  Continue to hold olmesartan.  3. Hyperlipidemia.  Continue statin.  4. Fibromyalgia.  Stable.  5.  Prophylaxis.  Lovenox.  6.  Disposition.  Pending.  If the patient continues to improve over the next 24-48 hours consider discharge.   LOS: 2 days  Jamaurion Slemmer A, MD 12/18/2011, 3:51 PM

## 2011-12-19 MED ORDER — ALBUTEROL SULFATE (5 MG/ML) 0.5% IN NEBU
2.5000 mg | INHALATION_SOLUTION | Freq: Four times a day (QID) | RESPIRATORY_TRACT | Status: DC
  Administered 2011-12-19 (×3): 2.5 mg via RESPIRATORY_TRACT
  Filled 2011-12-19 (×3): qty 0.5

## 2011-12-19 MED ORDER — ALBUTEROL SULFATE (5 MG/ML) 0.5% IN NEBU: 2.5000 mg | INHALATION_SOLUTION | RESPIRATORY_TRACT | Status: DC | PRN

## 2011-12-19 MED ORDER — IPRATROPIUM BROMIDE 0.02 % IN SOLN
0.5000 mg | Freq: Four times a day (QID) | RESPIRATORY_TRACT | Status: DC
  Administered 2011-12-19 (×3): 0.5 mg via RESPIRATORY_TRACT
  Filled 2011-12-19 (×2): qty 2.5

## 2011-12-19 NOTE — Plan of Care (Signed)
Problem: Food- and Nutrition-Related Knowledge Deficit (NB-1.1) Goal: Nutrition education Formal process to instruct or train a patient/client in a skill or to impart knowledge to help patients/clients voluntarily manage or modify food choices and eating behavior to maintain or improve health.  Outcome: Completed/Met Date Met:  12/19/11 Patient requesting education on a heart healthy diet. We reviewed low fat and low sodium food choices, label reading, and cooking/food preparation strategies. We also reviewed foods allowed on menu while in the hospital. Patient motivated to change dietary habits. Educational handout was provided.

## 2011-12-19 NOTE — Progress Notes (Signed)
Subjective: Breathing significantly better.  Wheezing almost resolved.  Feeling a lot better today.  Objective: Vital signs in last 24 hours: Filed Vitals:   12/18/11 2250 12/19/11 0602 12/19/11 1418 12/19/11 1452  BP: 103/68 141/82 119/53   Pulse: 71 79 85   Temp: 97.6 F (36.4 C) 97.8 F (36.6 C) 98.1 F (36.7 C)   TempSrc: Oral Oral Oral   Resp: 19 19 19    Height:      Weight:      SpO2: 91% 90% 91% 93%   Weight change:   Intake/Output Summary (Last 24 hours) at 12/19/11 1617 Last data filed at 12/19/11 1417  Gross per 24 hour  Intake    600 ml  Output      0 ml  Net    600 ml    Physical Exam: General: Awake, Oriented, No acute distress. HEENT: EOMI. Neck: Supple CV: S1 and S2 Lungs: Clear to auscultation bilaterally. Abdomen: Soft, Nontender, Nondistended, +bowel sounds. Ext: Good pulses. Trace edema.  Lab Results:  [facility] 12/18/11 0524 12/17/11 0441  NA 141 139  K 3.9 3.8  CL 110 109  CO2 23 22  GLUCOSE 101* 149*  BUN 14 11  CREATININE 0.77 0.76  CALCIUM 9.0 9.0  MG -- --  PHOS -- --    Basename 12/16/11 1658  AST 22  ALT 22  ALKPHOS 46  BILITOT 0.6  PROT 6.9  ALBUMIN 3.8   No results found for this basename: LIPASE:2,AMYLASE:2 in the last 72 hours  Basename 12/18/11 0524 12/17/11 0441 12/16/11 1658  WBC 9.0 12.2* --  NEUTROABS -- -- 8.6*  HGB 10.4* 11.3* --  HCT 30.8* 33.2* --  MCV 88.0 86.9 --  PLT 215 207 --   No results found for this basename: CKTOTAL:3,CKMB:3,CKMBINDEX:3,TROPONINI:3 in the last 72 hours No components found with this basename: POCBNP:3 No results found for this basename: DDIMER:2 in the last 72 hours No results found for this basename: HGBA1C:2 in the last 72 hours No results found for this basename: CHOL:2,HDL:2,LDLCALC:2,TRIG:2,CHOLHDL:2,LDLDIRECT:2 in the last 72 hours No results found for this basename: TSH,T4TOTAL,FREET3,T3FREE,THYROIDAB in the last 72 hours No results found for this basename:  VITAMINB12:2,FOLATE:2,FERRITIN:2,TIBC:2,IRON:2,RETICCTPCT:2 in the last 72 hours  Micro Results: Recent Results (from the past 240 hour(s))  URINE CULTURE     Status: Normal   Collection Time   12/16/11  5:13 PM      Component Value Range Status Comment   Specimen Description URINE, CLEAN CATCH   Final    Special Requests NONE   Final    Culture  Setup Time [id number]   Final    Colony Count NO GROWTH   Final    Culture NO GROWTH   Final    Report Status 12/18/2011 FINAL   Final   CULTURE, BLOOD (ROUTINE X 2)     Status: Normal (Preliminary result)   Collection Time   12/16/11  5:20 PM      Component Value Range Status Comment   Specimen Description BLOOD RIGHT ARM   Final    Special Requests BOTTLES DRAWN AEROBIC AND ANAEROBIC 5CC   Final    Culture  Setup Time [id number]   Final    Culture     Final    Value:        BLOOD CULTURE RECEIVED NO GROWTH TO DATE CULTURE WILL BE HELD FOR 5 DAYS BEFORE ISSUING A FINAL NEGATIVE REPORT   Report Status PENDING   Incomplete   CULTURE,  BLOOD (ROUTINE X 2)     Status: Normal (Preliminary result)   Collection Time   12/16/11  5:25 PM      Component Value Range Status Comment   Specimen Description BLOOD RIGHT HAND   Final    Special Requests BOTTLES DRAWN AEROBIC AND ANAEROBIC 5CC   Final    Culture  Setup Time [id number]   Final    Culture     Final    Value:        BLOOD CULTURE RECEIVED NO GROWTH TO DATE CULTURE WILL BE HELD FOR 5 DAYS BEFORE ISSUING A FINAL NEGATIVE REPORT   Report Status PENDING   Incomplete     Studies/Results: No results found.  Medications: I have reviewed the patient's current medications. Scheduled Meds:    . albuterol  2.5 mg Nebulization Q6H WA  . amitriptyline  25 mg Oral QHS  . aspirin EC  81 mg Oral Daily  . azithromycin  250 mg Oral Daily  . cefUROXime  500 mg Oral BID WC  . colesevelam  1,875 mg Oral BID WC  . enoxaparin  40 mg Subcutaneous QHS  . ipratropium  0.5 mg Nebulization Q6H WA  .  methylPREDNISolone (SOLU-MEDROL) injection  40 mg Intravenous Q6H  . potassium chloride  40 mEq Oral Daily  . predniSONE  40 mg Oral Q breakfast  . rosuvastatin  10 mg Oral Custom  . sodium chloride  3 mL Intravenous Q12H   Continuous Infusions:  PRN Meds:.acetaminophen, albuterol, ondansetron (ZOFRAN) IV, ondansetron, DISCONTD: albuterol  Assessment/Plan: Acute respiratory failure secondary to community-acquired pneumonia Influenza PCR negative.  Improved on ceftriaxone and azithromycin, transitioned to cefuroxime and azithromycin. Antibiotics since 12/16/2011.  HIV antibody nonreactive.  Urine strep pneumo negative.  Urine legionella pending.  Continue steroids.  Likely the patient has reactive airway disease secondary to pneumonia, patient will need outpatient pulmonary function tests.  Hypertension Improved.  Now normotensive.  Continue to hold olmesartan.  Hyperlipidemia Continue statin.  Fibromyalgia Stable.  Prophylaxis Lovenox.  Generalized weakness Will have PT evaluate the patient tomorrow.  Ambulate 3 times a day with assistance.  Disposition Pending.  If the patient continues to improve over the next 24 hours consider discharge.   LOS: 3 days  Kiyona Mcnall A, MD 12/19/2011, 4:17 PM

## 2011-12-19 NOTE — Telephone Encounter (Signed)
Looks like she went to hospital for pneumonia , will continue to follow

## 2011-12-20 LAB — RETICULOCYTES
RBC.: 3.7 MIL/uL — ABNORMAL LOW (ref 3.87–5.11)
Retic Count, Absolute: 44.4 10*3/uL (ref 19.0–186.0)
Retic Ct Pct: 1.2 % (ref 0.4–3.1)

## 2011-12-20 LAB — IRON AND TIBC
Iron: 90 ug/dL (ref 42–135)
Saturation Ratios: 28 % (ref 20–55)
TIBC: 316 ug/dL (ref 250–470)
UIBC: 226 ug/dL (ref 125–400)

## 2011-12-20 LAB — LEGIONELLA ANTIGEN, URINE: Legionella Antigen, Urine: NEGATIVE

## 2011-12-20 LAB — VITAMIN B12: Vitamin B-12: 194 pg/mL — ABNORMAL LOW (ref 211–911)

## 2011-12-20 LAB — FOLATE: Folate: >20 ng/mL

## 2011-12-20 LAB — FERRITIN: Ferritin: 682 ng/mL — ABNORMAL HIGH (ref 10–291)

## 2011-12-20 MED ORDER — AZITHROMYCIN 250 MG PO TABS: 250.0000 mg | ORAL_TABLET | Freq: Every day | ORAL | Status: DC

## 2011-12-20 MED ORDER — IPRATROPIUM-ALBUTEROL 18-103 MCG/ACT IN AERO
2.0000 | INHALATION_SPRAY | RESPIRATORY_TRACT | Status: DC | PRN
  Filled 2011-12-20: qty 14.7

## 2011-12-20 MED ORDER — CEFUROXIME AXETIL 500 MG PO TABS: 500.0000 mg | ORAL_TABLET | Freq: Two times a day (BID) | ORAL | Status: DC

## 2011-12-20 MED ORDER — ALBUTEROL SULFATE (5 MG/ML) 0.5% IN NEBU: 2.5000 mg | INHALATION_SOLUTION | Freq: Four times a day (QID) | RESPIRATORY_TRACT | Status: DC | PRN

## 2011-12-20 MED ORDER — IPRATROPIUM-ALBUTEROL 18-103 MCG/ACT IN AERO: 2.0000 | INHALATION_SPRAY | Freq: Four times a day (QID) | RESPIRATORY_TRACT | Status: DC | PRN

## 2011-12-20 MED ORDER — PREDNISONE 10 MG PO TABS: ORAL_TABLET | ORAL | Status: DC

## 2011-12-20 MED ORDER — IPRATROPIUM-ALBUTEROL 18-103 MCG/ACT IN AERO
2.0000 | INHALATION_SPRAY | Freq: Three times a day (TID) | RESPIRATORY_TRACT | Status: DC
  Administered 2011-12-20: 2 via RESPIRATORY_TRACT
  Filled 2011-12-20: qty 14.7

## 2011-12-20 NOTE — Progress Notes (Signed)
Physical Therapy Note  Order received. Chart reviewed. Per MD, pt declines PT eval at this time-no needs. Pt plans to D/C home today.  Rebeca Alert, PT  [phone]

## 2011-12-20 NOTE — Progress Notes (Signed)
Subjective: Breathing better.  Patient feeling weak but ambulated the halls without any difficulty.  Wondering if she could take a shower.  Objective: Vital signs in last 24 hours: Filed Vitals:   12/19/11 1947 12/19/11 2210 12/20/11 0618 12/20/11 0840  BP:  133/85 122/77   Pulse:  83 66   Temp:  97.9 F (36.6 C) 97.7 F (36.5 C)   TempSrc:  Oral Oral   Resp:  19 18   Height:      Weight:      SpO2: 93% 98% 92% 95%   Weight change:   Intake/Output Summary (Last 24 hours) at 12/20/11 0916 Last data filed at 12/20/11 0902  Gross per 24 hour  Intake    820 ml  Output      0 ml  Net    820 ml    Physical Exam: General: Awake, Oriented, No acute distress. HEENT: EOMI. Neck: Supple CV: S1 and S2 Lungs: Faint scattered expiratory wheezing, good air movement bilaterally. Abdomen: Soft, Nontender, Nondistended, +bowel sounds. Ext: Good pulses. Trace edema.  Lab Results:  [facility] 12/18/11 0524  NA 141  K 3.9  CL 110  CO2 23  GLUCOSE 101*  BUN 14  CREATININE 0.77  CALCIUM 9.0  MG --  PHOS --   No results found for this basename: AST:2,ALT:2,ALKPHOS:2,BILITOT:2,PROT:2,ALBUMIN:2 in the last 72 hours No results found for this basename: LIPASE:2,AMYLASE:2 in the last 72 hours  Basename 12/18/11 0524  WBC 9.0  NEUTROABS --  HGB 10.4*  HCT 30.8*  MCV 88.0  PLT 215   No results found for this basename: CKTOTAL:3,CKMB:3,CKMBINDEX:3,TROPONINI:3 in the last 72 hours No components found with this basename: POCBNP:3 No results found for this basename: DDIMER:2 in the last 72 hours No results found for this basename: HGBA1C:2 in the last 72 hours No results found for this basename: CHOL:2,HDL:2,LDLCALC:2,TRIG:2,CHOLHDL:2,LDLDIRECT:2 in the last 72 hours No results found for this basename: TSH,T4TOTAL,FREET3,T3FREE,THYROIDAB in the last 72 hours  Basename 12/20/11 0535  VITAMINB12 --  FOLATE --  FERRITIN --  TIBC --  IRON --  RETICCTPCT 1.2    Micro  Results: Recent Results (from the past 240 hour(s))  URINE CULTURE     Status: Normal   Collection Time   12/16/11  5:13 PM      Component Value Range Status Comment   Specimen Description URINE, CLEAN CATCH   Final    Special Requests NONE   Final    Culture  Setup Time [id number]   Final    Colony Count NO GROWTH   Final    Culture NO GROWTH   Final    Report Status 12/18/2011 FINAL   Final   CULTURE, BLOOD (ROUTINE X 2)     Status: Normal (Preliminary result)   Collection Time   12/16/11  5:20 PM      Component Value Range Status Comment   Specimen Description BLOOD RIGHT ARM   Final    Special Requests BOTTLES DRAWN AEROBIC AND ANAEROBIC 5CC   Final    Culture  Setup Time [id number]   Final    Culture     Final    Value:        BLOOD CULTURE RECEIVED NO GROWTH TO DATE CULTURE WILL BE HELD FOR 5 DAYS BEFORE ISSUING A FINAL NEGATIVE REPORT   Report Status PENDING   Incomplete   CULTURE, BLOOD (ROUTINE X 2)     Status: Normal (Preliminary result)   Collection Time   12/16/11  5:25 PM      Component Value Range Status Comment   Specimen Description BLOOD RIGHT HAND   Final    Special Requests BOTTLES DRAWN AEROBIC AND ANAEROBIC 5CC   Final    Culture  Setup Time [id number]   Final    Culture     Final    Value:        BLOOD CULTURE RECEIVED NO GROWTH TO DATE CULTURE WILL BE HELD FOR 5 DAYS BEFORE ISSUING A FINAL NEGATIVE REPORT   Report Status PENDING   Incomplete     Studies/Results: No results found.  Medications: I have reviewed the patient's current medications. Scheduled Meds:    . albuterol-ipratropium  2 puff Inhalation TID  . amitriptyline  25 mg Oral QHS  . aspirin EC  81 mg Oral Daily  . azithromycin  250 mg Oral Daily  . cefUROXime  500 mg Oral BID WC  . colesevelam  1,875 mg Oral BID WC  . enoxaparin  40 mg Subcutaneous QHS  . potassium chloride  40 mEq Oral Daily  . predniSONE  40 mg Oral Q breakfast  . rosuvastatin  10 mg Oral Custom  . sodium chloride   3 mL Intravenous Q12H  . DISCONTD: albuterol  2.5 mg Nebulization Q6H WA  . DISCONTD: ipratropium  0.5 mg Nebulization Q6H WA   Continuous Infusions:  PRN Meds:.acetaminophen, albuterol, albuterol-ipratropium, ondansetron (ZOFRAN) IV, ondansetron, DISCONTD: albuterol  Assessment/Plan: Acute respiratory failure secondary to community-acquired pneumonia Influenza PCR negative.  Improved on ceftriaxone and azithromycin, transitioned to cefuroxime and azithromycin. Antibiotics since 12/16/2011.  HIV antibody nonreactive.  Urine strep pneumo negative.  Urine legionella pending.  Continue steroids, which will be tapered at discharge.  Likely the patient has reactive airway disease secondary to pneumonia, patient will need outpatient pulmonary function tests.  Hypertension Improved.  Now normotensive.  Olmesartan will be held at discharge..  Hyperlipidemia Continue statin.  Fibromyalgia Stable.  Prophylaxis Lovenox.  Generalized weakness Agent ambulated the halls without any difficulty, patient at this time feels like she does not need physical therapy at this time.  Disposition Discharged to home today.   LOS: 4 days  Jaquia Benedicto A, MD 12/20/2011, 9:16 AM

## 2011-12-20 NOTE — Discharge Summary (Addendum)
Discharge Summary  Beth Fisher MR#: [id number]  DOB:Feb 19, 1951  Date of Admission: 12/16/2011 Date of Discharge: 12/20/2011  Patient's PCP: Beth Manns, MD, MD  Attending Physician:Shyanne Mcclary A  Consults: None  Discharge Diagnoses: Active Problems:  Hypertension  Hyperlipidemia  Fibromyalgia  Community acquired pneumonia  acute respiratory failure/reactive airway disease.  Brief Admitting History and Physical [name] with history of hypertension, fibromyalgia, hyperlipidemia who presented on 12/16/2011 with complaints of progressive shortness of breath and cough productive sputum.  Discharge Medications Medication List  As of 12/20/2011  9:26 AM   STOP taking these medications         olmesartan 20 MG tablet         TAKE these medications         acetaminophen 500 MG tablet   Commonly known as: TYLENOL   Take 1,000 mg by mouth every 6 (six) hours as needed. pain      ACID REDUCER 10 MG tablet   Generic drug: famotidine   Take 10 mg by mouth daily as needed. OTC as directed.      albuterol-ipratropium 18-103 MCG/ACT inhaler   Commonly known as: COMBIVENT   Inhale 2 puffs into the lungs every 6 (six) hours as needed for wheezing or shortness of breath.      amitriptyline 25 MG tablet   Commonly known as: ELAVIL   Take 1 tablet (25 mg total) by mouth at bedtime.      aspirin-sod bicarb-citric acid 325 MG Tbef   Commonly known as: ALKA-SELTZER   Take 650 mg by mouth every 6 (six) hours as needed. Cold symptons      azithromycin 250 MG tablet   Commonly known as: ZITHROMAX   Take 1 tablet (250 mg total) by mouth daily.      BRAINSTRONG PRENATAL PO   Take 300 mg by mouth daily.      cefUROXime 500 MG tablet   Commonly known as: CEFTIN   Take 1 tablet (500 mg total) by mouth 2 (two) times daily with a meal.      colesevelam 625 MG tablet   Commonly known as: WELCHOL   Take 1,875 mg by mouth 2 (two) times daily with a meal. Take 3  tablets by mouth twice a day.      fenofibrate micronized 130 MG capsule   Commonly known as: ANTARA   Take 1 capsule (130 mg total) by mouth daily before breakfast.      Krill Oil Caps   Take by mouth. 1 capsule by mouth daily (?Mg)      multivitamin capsule   Take 1 capsule by mouth daily.      predniSONE 10 MG tablet   Commonly known as: DELTASONE   40 mg daily for 2 days, 20 mg daily for 3 days, 10 mg daily for 3 days, 5 mg daily for 3 days then discontinue      rosuvastatin 10 MG tablet   Commonly known as: CRESTOR   Take 10 mg by mouth See admin instructions. Takes 1 tablet by mouth on Mon and Thur of each week.            Hospital Course: Acute respiratory failure secondary to community-acquired pneumonia Influenza PCR negative.  Improved on ceftriaxone and azithromycin, transitioned to cefuroxime and azithromycin. Antibiotics since 12/16/2011, patient to continue antibiotics for 6 more days to complete a ten-day course.  HIV antibody nonreactive.  Urine strep pneumo negative.  Urine legionella still pending.  Start the  patient on steroids during the course of hospital stay as she was wheezy, taper the steroids after discharge.  Likely the patient has reactive airway disease secondary to pneumonia, patient will need outpatient pulmonary function tests.  At which time based on the pulmonary function tests a decision can be made to determine whether the patient will need to be started on long-term inhalers for maintenance.  Hypertension Improved.  Normotensive during the course of hospital stay, olmesartan will be held at discharge.  Discussed with the patient that she will need to followup with her primary care physician to determine if olmesartan needs to be restarted.  Hyperlipidemia Continue statin.  Fibromyalgia Stable.  Prophylaxis Lovenox.  Generalized weakness Agent ambulated the halls without any difficulty, patient at this time feels like she does not need  physical therapy at this time.  Anemia Suspect is likely delusional.  However anemia panel is pending.  Depending on the anemia panel will defer to primary care physician if supplemental iron needs to be restarted.  Day of Discharge BP 122/77  Pulse 66  Temp(Src) 97.7 F (36.5 C) (Oral)  Resp 18  Ht 5\' 7"  (1.702 m)  Wt 105.733 kg (233 lb 1.6 oz)  BMI 36.51 kg/m2  SpO2 95%  LMP 10/11/2000  Results for orders placed during the hospital encounter of 12/16/11 (from the past 48 hour(s))  RETICULOCYTES     Status: Abnormal   Collection Time   12/20/11  5:35 AM      Component Value Range Comment   Retic Ct Pct 1.2  0.4 - 3.1 (%)    RBC. 3.70 (*) 3.87 - 5.11 (MIL/uL)    Retic Count, Manual 44.4  19.0 - 186.0 (K/uL)     Dg Chest 2 View  12/16/2011  *RADIOLOGY REPORT*  Clinical Data: Fever, productive cough, weakness  CHEST - 2 VIEW  Comparison: None.  Findings: Patchy opacities in the left upper lobe, lingula, and possibly the right upper lobe, suspicious for pneumonia. No pleural effusion or pneumothorax.  Cardiomediastinal silhouette is within normal limits.  Mild degenerative changes of the visualized thoracolumbar spine.  IMPRESSION: Multifocal patchy opacities, suspicious for pneumonia.  Original Report Authenticated By: Charline Bills, M.D.     Disposition: Home  Diet: Heart healthy diet  Activity: Resume as tolerated   Follow-up Appts: Discharge Orders    Future Appointments: Provider: Department: Dept Phone: Center:   04/18/2012 10:00 AM [facility] [place] [phone] [id number]   04/25/2012 10:30 AM Judy Pimple, MD [facility] [phone] [id number]     Future Orders Please Complete By Expires   Diet - low sodium heart healthy      Increase activity slowly      Discharge instructions      Comments:   Please followup with Beth Manns, MD (PCP) in 1 week.  Discuss with your primary care physician about arranging for outpatient pulmonary function test.   Also discussed with her primary care physician if and when blood pressure medication (olmesartan) needs to be resumed.      TESTS THAT NEED FOLLOW-UP Urine legionella and anemia panel  Time spent on discharge, talking to the patient, and coordinating care: 35 mins.   Signed: Maryfer Fisher A, MD 12/20/2011, 9:26 AM   Addendum: Anemia panel normal, serum iron 90, UIBC 226, TIBC 316, ferritin 682, folic acid greater than 20. Urine legionella negative.  Beth Fisher A, MD 12/20/2011, 4:21 PM

## 2011-12-23 LAB — CULTURE, BLOOD (ROUTINE X 2)
Culture  Setup Time: 201303080124
Culture  Setup Time: 201303080125
Culture: NO GROWTH
Culture: NO GROWTH

## 2011-12-29 ENCOUNTER — Ambulatory Visit (INDEPENDENT_AMBULATORY_CARE_PROVIDER_SITE_OTHER): Payer: PRIVATE HEALTH INSURANCE | Admitting: Family Medicine

## 2011-12-29 ENCOUNTER — Ambulatory Visit: Payer: PRIVATE HEALTH INSURANCE | Admitting: Family Medicine

## 2011-12-29 ENCOUNTER — Encounter: Payer: Self-pay | Admitting: Family Medicine

## 2011-12-29 VITALS — BP 120/80 | HR 76 | Temp 98.1°F | Wt 225.2 lb

## 2011-12-29 DIAGNOSIS — D649 Anemia, unspecified: Secondary | ICD-10-CM | POA: Insufficient documentation

## 2011-12-29 DIAGNOSIS — J189 Pneumonia, unspecified organism: Secondary | ICD-10-CM

## 2011-12-29 DIAGNOSIS — J45909 Unspecified asthma, uncomplicated: Secondary | ICD-10-CM

## 2011-12-29 DIAGNOSIS — E538 Deficiency of other specified B group vitamins: Secondary | ICD-10-CM

## 2011-12-29 DIAGNOSIS — J683 Other acute and subacute respiratory conditions due to chemicals, gases, fumes and vapors: Secondary | ICD-10-CM

## 2011-12-29 LAB — CBC WITH DIFFERENTIAL/PLATELET
Basophils Absolute: 0 10*3/uL (ref 0.0–0.1)
Basophils Relative: 0.4 % (ref 0.0–3.0)
Eosinophils Absolute: 0 10*3/uL (ref 0.0–0.7)
Eosinophils Relative: 0.5 % (ref 0.0–5.0)
HCT: 42.6 % (ref 36.0–46.0)
Hemoglobin: 14.6 g/dL (ref 12.0–15.0)
Lymphocytes Relative: 17.3 % (ref 12.0–46.0)
Lymphs Abs: 1.6 10*3/uL (ref 0.7–4.0)
MCHC: 34.2 g/dL (ref 30.0–36.0)
MCV: 88.5 fl (ref 78.0–100.0)
Monocytes Absolute: 0.4 10*3/uL (ref 0.1–1.0)
Monocytes Relative: 4.1 % (ref 3.0–12.0)
Neutro Abs: 7.2 10*3/uL (ref 1.4–7.7)
Neutrophils Relative %: 77.7 % — ABNORMAL HIGH (ref 43.0–77.0)
Platelets: 387 10*3/uL (ref 150.0–400.0)
RBC: 4.81 Mil/uL (ref 3.87–5.11)
RDW: 13.6 % (ref 11.5–14.6)
WBC: 9.3 10*3/uL (ref 4.5–10.5)

## 2011-12-29 MED ORDER — CYANOCOBALAMIN 1000 MCG/ML IJ SOLN
1000.0000 ug | Freq: Once | INTRAMUSCULAR | Status: AC
  Administered 2011-12-29: 1000 ug via INTRAMUSCULAR

## 2011-12-29 NOTE — Assessment & Plan Note (Signed)
Prior short term smoker Was bad with pneumonia -no other time of symptoms  Now clear with pred - no NMTS Will re check at f/u and decide about PFTs or spirometry

## 2011-12-29 NOTE — Assessment & Plan Note (Signed)
New/ mild/ found in hosp B12 shot today Then oral repl 500 mcg daily and diet  Re check at f/u

## 2011-12-29 NOTE — Patient Instructions (Signed)
Lab today B12 shot today  Take 500 mcg of vitamin B12 over the counter each day and eat green vegetables  Advance your activity slowly  Stay off the benicar for now - will re evaluate next time   Follow up with me in 4-6 weeks for re check and chest x ray and pneumonia vaccine and to discuss possible pulmonary tests if necessary

## 2011-12-29 NOTE — Assessment & Plan Note (Signed)
LUL - much improved  Had reactive airways- now calm  Finishing pred taper  Finished cefuroxime and azith  Slowly getting energy level back F/u in 4-6 wk for re check cxr/ pneumovax and disc about ? spirom or PFTs

## 2011-12-29 NOTE — Assessment & Plan Note (Signed)
With last hb 10.4 in hosp- may have been dilutional Will re check today  Also replace B12

## 2011-12-29 NOTE — Progress Notes (Signed)
Subjective:    Patient ID: Beth Fisher, female    DOB: 04/04/1951, 61 y.o.   MRN: [id number]  HPI Here for post hospital f/u for pneumonia LUL- with reactive airways Had started with sob at the gym before she came to the office  Got so bad she was weak and then her fever spiked to 105   She visits nursing home and is in nursing home too   Was d/c on cefuroxime and prednisone and azithromycin for CAP Still finishing the prednisone - almost done  Still feels generally weak and tired - but much better than she was  Did recommend PFTs after totally better   Stopped her benicar for low bp in hosp  Is still holding that bp is  120/80   Today No cp or palpitations or headaches or edema  No side effects to medicines    She smoked 30 years ago (just for 10 years) Stopped her breathing treatments 3 days ago Not wheezing at all   Wt is down total of 8 lb with bmi of 35  Hb in hosp was 10.4 (? Dillutional)  B12 was also low Lab Results  Component Value Date   WBC 9.0 12/18/2011   HGB 10.4* 12/18/2011   HCT 30.8* 12/18/2011   MCV 88.0 12/18/2011   PLT 215 12/18/2011    Lab Results  Component Value Date   VITAMINB12 194* 12/20/2011     Patient Active Problem List  Diagnoses  . Hypertension  . Hyperlipidemia  . Peptic ulcer disease  . Fibromyalgia  . Colon polyps  . Menopause syndrome  . Routine general medical examination at a health care facility  . Gynecological examination  . Obesity  . Community acquired pneumonia  . B12 deficiency  . Reactive airways dysfunction syndrome  . Anemia   Past Medical History  Diagnosis Date  . Ulcer   . Hypertension   . Hyperlipidemia   . History of UTI   . Fibromyalgia   . Inflammatory polyps of colon     08/2008 OK 08/2010 Ok  byk Dr Kinnie Scales  . Pneumonia 12/2011   Past Surgical History  Procedure Date  . Hernia repair 1978   History  Substance Use Topics  . Smoking status: Former Smoker    Quit date: 10/11/1980  . Smokeless  tobacco: Not on file  . Alcohol Use: Not on file   Family History  Problem Relation Age of Onset  . Alcohol abuse Mother   . Hyperlipidemia Mother   . Alzheimer's disease Mother   . Cancer Mother     breast cancer  . Diabetes Father   . Heart disease Father   . Hyperlipidemia Father   . Mental illness Father   . Alzheimer's disease Father   . Cancer Paternal Grandmother     colon  . Diabetes Paternal Grandmother   . Heart disease Cousin     sudden death less than 63 yrs old heart attack.   Allergies  Allergen Reactions  . Zetia (Ezetimibe) Other (See Comments)    Muscle aches!!!   Current Outpatient Prescriptions on File Prior to Visit  Medication Sig Dispense Refill  . acetaminophen (TYLENOL) 500 MG tablet Take 1,000 mg by mouth every 6 (six) hours as needed. pain      . amitriptyline (ELAVIL) 25 MG tablet Take 1 tablet (25 mg total) by mouth at bedtime.  30 tablet  11  . colesevelam (WELCHOL) 625 MG tablet Take 1,875 mg by mouth 2 (two)  times daily with a meal. Take 3 tablets by mouth twice a day.      . famotidine (ACID REDUCER) 10 MG tablet Take 10 mg by mouth daily as needed. OTC as directed.      . fenofibrate micronized (ANTARA) 130 MG capsule Take 1 capsule (130 mg total) by mouth daily before breakfast.  30 capsule  11  . Krill Oil CAPS Take by mouth. 1 capsule by mouth daily (?Mg)       . Multiple Vitamin (MULTIVITAMIN) capsule Take 1 capsule by mouth daily.        . predniSONE (DELTASONE) 10 MG tablet 40 mg daily for 2 days, 20 mg daily for 3 days, 10 mg daily for 3 days, 5 mg daily for 3 days then discontinue  20 tablet  0  . Prenatal MV-Min-Fe Cbn-FA-DHA (BRAINSTRONG PRENATAL PO) Take 300 mg by mouth daily.        . rosuvastatin (CRESTOR) 10 MG tablet Take 10 mg by mouth See admin instructions. Takes 1 tablet by mouth on Mon and Thur of each week.       No current facility-administered medications on file prior to visit.     Review of Systems Review of Systems    Constitutional: Negative for fever, appetite change, fatigue and unexpected weight change.  Eyes: Negative for pain and visual disturbance.  Respiratory: Negative for cough and shortness of breath.  (this is better) Cardiovascular: Negative for cp or palpitations    Gastrointestinal: Negative for nausea, diarrhea and constipation.  Genitourinary: Negative for urgency and frequency.  Skin: Negative for pallor or rash   Neurological: Negative for weakness, light-headedness, numbness and headaches.  Hematological: Negative for adenopathy. Does not bruise/bleed easily.  Psychiatric/Behavioral: Negative for dysphoric mood. The patient is not nervous/anxious.          Objective:   Physical Exam  Constitutional: She appears well-developed and well-nourished. No distress.  HENT:  Head: Normocephalic and atraumatic.  Right Ear: External ear normal.  Left Ear: External ear normal.  Nose: Nose normal.  Mouth/Throat: Oropharynx is clear and moist.  Eyes: Conjunctivae and EOM are normal. Pupils are equal, round, and reactive to light. Right eye exhibits no discharge. Left eye exhibits no discharge. No scleral icterus.  Neck: Normal range of motion. Neck supple. No JVD present. Carotid bruit is not present. No thyromegaly present.  Cardiovascular: Normal rate, regular rhythm, normal heart sounds and intact distal pulses.  Exam reveals no gallop.   Pulmonary/Chest: Effort normal and breath sounds normal. No respiratory distress. She has no wheezes. She has no rales. She exhibits no tenderness.       CTA with good air exch No wheeze, even on forced exp  Abdominal: Soft. Bowel sounds are normal. She exhibits no distension and no mass. There is no tenderness.  Musculoskeletal: She exhibits no edema.  Lymphadenopathy:    She has no cervical adenopathy.  Neurological: She is alert. She has normal reflexes. No cranial nerve deficit. She exhibits normal muscle tone. Coordination normal.  Skin: Skin is  warm and dry. No rash noted. No erythema. No pallor.  Psychiatric: She has a normal mood and affect.          Assessment & Plan:

## 2011-12-30 ENCOUNTER — Ambulatory Visit: Payer: PRIVATE HEALTH INSURANCE | Admitting: Family Medicine

## 2011-12-30 ENCOUNTER — Encounter: Payer: Self-pay | Admitting: *Deleted

## 2012-02-09 ENCOUNTER — Ambulatory Visit (INDEPENDENT_AMBULATORY_CARE_PROVIDER_SITE_OTHER): Payer: PRIVATE HEALTH INSURANCE | Admitting: Family Medicine

## 2012-02-09 ENCOUNTER — Encounter: Payer: Self-pay | Admitting: Family Medicine

## 2012-02-09 VITALS — BP 132/82 | HR 86 | Temp 98.0°F | Ht 67.0 in | Wt 226.0 lb

## 2012-02-09 DIAGNOSIS — J189 Pneumonia, unspecified organism: Secondary | ICD-10-CM

## 2012-02-09 DIAGNOSIS — E538 Deficiency of other specified B group vitamins: Secondary | ICD-10-CM

## 2012-02-09 DIAGNOSIS — J45909 Unspecified asthma, uncomplicated: Secondary | ICD-10-CM

## 2012-02-09 DIAGNOSIS — Z23 Encounter for immunization: Secondary | ICD-10-CM

## 2012-02-09 DIAGNOSIS — J683 Other acute and subacute respiratory conditions due to chemicals, gases, fumes and vapors: Secondary | ICD-10-CM

## 2012-02-09 NOTE — Patient Instructions (Signed)
B12 level today and we will advise you with what to do next  chest xray today Glad you are feeling better Energy will return slowly If worse, update me

## 2012-02-09 NOTE — Assessment & Plan Note (Addendum)
Clinically much improved - just still getting energy back Follow up chest x ray when able (no staff to do it today) Also pneumovax today

## 2012-02-09 NOTE — Assessment & Plan Note (Signed)
Resolved This has only happened with illness Will continue to follow

## 2012-02-09 NOTE — Progress Notes (Signed)
Subjective:    Patient ID: Beth Fisher, female    DOB: 1951/05/14, 61 y.o.   MRN: [id number]  HPI Is doing well -much much better from her pneumonia  Slowly getting energy level back -- giving it time- getting back to regular regime   Gave one B12 shot and now 500 mcg daily   Re check x ray today  (actually has to return tomorrow because I did not know our xray tech was going to be out-- pt is very understanding ) No cough or wheeze or other symptom    No abd pain or gerd   Patient Active Problem List  Diagnoses  . Hypertension  . Hyperlipidemia  . Peptic ulcer disease  . Fibromyalgia  . Colon polyps  . Menopause syndrome  . Routine general medical examination at a health care facility  . Gynecological examination  . Obesity  . Community acquired pneumonia  . B12 deficiency  . Reactive airways dysfunction syndrome  . Anemia   Past Medical History  Diagnosis Date  . Ulcer   . Hypertension   . Hyperlipidemia   . History of UTI   . Fibromyalgia   . Inflammatory polyps of colon     08/2008 OK 08/2010 Ok  byk Dr Kinnie Scales  . Pneumonia 12/2011   Past Surgical History  Procedure Date  . Hernia repair 1978   History  Substance Use Topics  . Smoking status: Former Smoker    Quit date: 10/11/1980  . Smokeless tobacco: Not on file  . Alcohol Use: Not on file   Family History  Problem Relation Age of Onset  . Alcohol abuse Mother   . Hyperlipidemia Mother   . Alzheimer's disease Mother   . Cancer Mother     breast cancer  . Diabetes Father   . Heart disease Father   . Hyperlipidemia Father   . Mental illness Father   . Alzheimer's disease Father   . Cancer Paternal Grandmother     colon  . Diabetes Paternal Grandmother   . Heart disease Cousin     sudden death less than 43 yrs old heart attack.   Allergies  Allergen Reactions  . Zetia (Ezetimibe) Other (See Comments)    Muscle aches!!!   Current Outpatient Prescriptions on File Prior to Visit  Medication  Sig Dispense Refill  . amitriptyline (ELAVIL) 25 MG tablet Take 1 tablet (25 mg total) by mouth at bedtime.  30 tablet  11  . aspirin 81 MG tablet Take 81 mg by mouth daily.      . calcium-vitamin D (OSCAL WITH D) 500-200 MG-UNIT per tablet Take 1 tablet by mouth daily.      . colesevelam (WELCHOL) 625 MG tablet Take 1,875 mg by mouth 2 (two) times daily with a meal. Take 3 tablets by mouth twice a day.      . famotidine (ACID REDUCER) 10 MG tablet Take 10 mg by mouth daily as needed. OTC as directed.      . fenofibrate micronized (ANTARA) 130 MG capsule Take 1 capsule (130 mg total) by mouth daily before breakfast.  30 capsule  11  . Krill Oil CAPS Take by mouth. 1 capsule by mouth daily (?Mg)       . rosuvastatin (CRESTOR) 10 MG tablet Take 10 mg by mouth See admin instructions. Takes 1 tablet by mouth on Mon and Thur of each week.      Marland Kitchen DISCONTD: olmesartan (BENICAR) 20 MG tablet Take 1 tablet (20 mg  total) by mouth daily.  30 tablet  11       Review of Systems Review of Systems  Constitutional: Negative for fever, appetite change, fatigue and unexpected weight change.  Eyes: Negative for pain and visual disturbance.  Respiratory: Negative for cough and shortness of breath.   Cardiovascular: Negative for cp or palpitations    Gastrointestinal: Negative for nausea, diarrhea and constipation.  Genitourinary: Negative for urgency and frequency.  Skin: Negative for pallor or rash   Neurological: Negative for weakness, light-headedness, numbness and headaches.  Hematological: Negative for adenopathy. Does not bruise/bleed easily.  Psychiatric/Behavioral: Negative for dysphoric mood. The patient is not nervous/anxious.          Objective:   Physical Exam  Constitutional: She appears well-developed and well-nourished. No distress.       Obese and well appearing   HENT:  Head: Normocephalic and atraumatic.  Right Ear: External ear normal.  Left Ear: External ear normal.   Mouth/Throat: Oropharynx is clear and moist.  Eyes: Conjunctivae and EOM are normal. Pupils are equal, round, and reactive to light. No scleral icterus.  Neck: Normal range of motion. Neck supple. No JVD present. No thyromegaly present.  Cardiovascular: Normal rate, regular rhythm, normal heart sounds and intact distal pulses.  Exam reveals no gallop.   No murmur heard. Pulmonary/Chest: Effort normal and breath sounds normal. No respiratory distress. She has no wheezes. She has no rales. She exhibits no tenderness.       Good air exch No wheeze, even on forced exp   Abdominal: Soft. Bowel sounds are normal.  Musculoskeletal: Normal range of motion. She exhibits no edema and no tenderness.  Lymphadenopathy:    She has no cervical adenopathy.  Neurological: She is alert. She has normal reflexes. No cranial nerve deficit. Coordination normal.  Skin: Skin is warm and dry. No rash noted. No erythema. No pallor.  Psychiatric: She has a normal mood and affect.       cheerful          Assessment & Plan:

## 2012-02-10 ENCOUNTER — Ambulatory Visit (INDEPENDENT_AMBULATORY_CARE_PROVIDER_SITE_OTHER)
Admission: RE | Admit: 2012-02-10 | Discharge: 2012-02-10 | Disposition: A | Discharge status: Home / Self Care | Payer: PRIVATE HEALTH INSURANCE | Source: Ambulatory Visit | Attending: Family Medicine | Admitting: Family Medicine

## 2012-02-10 ENCOUNTER — Encounter: Payer: Self-pay | Admitting: Family Medicine

## 2012-02-10 DIAGNOSIS — J189 Pneumonia, unspecified organism: Secondary | ICD-10-CM

## 2012-02-10 LAB — VITAMIN B12: Vitamin B-12: 315 pg/mL (ref 211–911)

## 2012-02-10 NOTE — Assessment & Plan Note (Signed)
Level today Had one injection and now taking 500 mcg daily Will advise when result returns

## 2012-04-17 ENCOUNTER — Telehealth: Payer: Self-pay | Admitting: Family Medicine

## 2012-04-17 DIAGNOSIS — E538 Deficiency of other specified B group vitamins: Secondary | ICD-10-CM

## 2012-04-17 DIAGNOSIS — Z0000 Encounter for general adult medical examination without abnormal findings: Secondary | ICD-10-CM

## 2012-04-17 DIAGNOSIS — E785 Hyperlipidemia, unspecified: Secondary | ICD-10-CM

## 2012-04-17 NOTE — Telephone Encounter (Signed)
Message copied by Judy Pimple on Mon Apr 17, 2012 10:17 AM ------      Message from: Alvina Chou      Created: Mon Apr 17, 2012  9:02 AM      Regarding: Labs for Tuesday, 7.10.13       Patient is scheduled for CPX labs, please order future labs, Thanks , Camelia Eng

## 2012-04-18 ENCOUNTER — Other Ambulatory Visit: Payer: PRIVATE HEALTH INSURANCE

## 2012-04-24 ENCOUNTER — Other Ambulatory Visit (INDEPENDENT_AMBULATORY_CARE_PROVIDER_SITE_OTHER): Payer: PRIVATE HEALTH INSURANCE

## 2012-04-24 DIAGNOSIS — E785 Hyperlipidemia, unspecified: Secondary | ICD-10-CM

## 2012-04-24 DIAGNOSIS — E538 Deficiency of other specified B group vitamins: Secondary | ICD-10-CM

## 2012-04-24 DIAGNOSIS — Z0000 Encounter for general adult medical examination without abnormal findings: Secondary | ICD-10-CM

## 2012-04-24 LAB — LIPID PANEL
Cholesterol: 258 mg/dL — ABNORMAL HIGH (ref 0–200)
HDL: 45 mg/dL (ref >39.00)
Total CHOL/HDL Ratio: 6
Triglycerides: 235 mg/dL — ABNORMAL HIGH (ref 0.0–149.0)
VLDL: 47 mg/dL — ABNORMAL HIGH (ref 0.0–40.0)

## 2012-04-24 LAB — COMPREHENSIVE METABOLIC PANEL
ALT: 21 U/L (ref 0–35)
AST: 22 U/L (ref 0–37)
Albumin: 4.6 g/dL (ref 3.5–5.2)
Alkaline Phosphatase: 44 U/L (ref 39–117)
BUN: 18 mg/dL (ref 6–23)
CO2: 25 mEq/L (ref 19–32)
Calcium: 10.1 mg/dL (ref 8.4–10.5)
Chloride: 104 mEq/L (ref 96–112)
Creatinine, Ser: 0.8 mg/dL (ref 0.4–1.2)
GFR: 74.17 mL/min (ref >60.00)
Glucose, Bld: 92 mg/dL (ref 70–99)
Potassium: 4.2 mEq/L (ref 3.5–5.1)
Sodium: 140 mEq/L (ref 135–145)
Total Bilirubin: 0.8 mg/dL (ref 0.3–1.2)
Total Protein: 7.8 g/dL (ref 6.0–8.3)

## 2012-04-24 LAB — CBC WITH DIFFERENTIAL/PLATELET
Basophils Absolute: 0 10*3/uL (ref 0.0–0.1)
Basophils Relative: 0.6 % (ref 0.0–3.0)
Eosinophils Absolute: 0.2 10*3/uL (ref 0.0–0.7)
Eosinophils Relative: 3.3 % (ref 0.0–5.0)
HCT: 42.7 % (ref 36.0–46.0)
Hemoglobin: 14.6 g/dL (ref 12.0–15.0)
Lymphocytes Relative: 40.4 % (ref 12.0–46.0)
Lymphs Abs: 2.1 10*3/uL (ref 0.7–4.0)
MCHC: 34.2 g/dL (ref 30.0–36.0)
MCV: 87.7 fl (ref 78.0–100.0)
Monocytes Absolute: 0.6 10*3/uL (ref 0.1–1.0)
Monocytes Relative: 10.9 % (ref 3.0–12.0)
Neutro Abs: 2.3 10*3/uL (ref 1.4–7.7)
Neutrophils Relative %: 44.8 % (ref 43.0–77.0)
Platelets: 332 10*3/uL (ref 150.0–400.0)
RBC: 4.87 Mil/uL (ref 3.87–5.11)
RDW: 12.8 % (ref 11.5–14.6)
WBC: 5.2 10*3/uL (ref 4.5–10.5)

## 2012-04-24 LAB — TSH: TSH: 4.08 u[IU]/mL (ref 0.35–5.50)

## 2012-04-24 LAB — LDL CHOLESTEROL, DIRECT: Direct LDL: 174.7 mg/dL

## 2012-04-24 LAB — VITAMIN B12: Vitamin B-12: 489 pg/mL (ref 211–911)

## 2012-04-25 ENCOUNTER — Encounter: Payer: PRIVATE HEALTH INSURANCE | Admitting: Family Medicine

## 2012-05-01 ENCOUNTER — Encounter: Payer: Self-pay | Admitting: Family Medicine

## 2012-05-01 ENCOUNTER — Ambulatory Visit (INDEPENDENT_AMBULATORY_CARE_PROVIDER_SITE_OTHER): Payer: PRIVATE HEALTH INSURANCE | Admitting: Family Medicine

## 2012-05-01 ENCOUNTER — Other Ambulatory Visit: Payer: Self-pay | Admitting: Family Medicine

## 2012-05-01 VITALS — BP 130/82 | HR 71 | Temp 98.3°F | Ht 67.0 in | Wt 217.2 lb

## 2012-05-01 DIAGNOSIS — I10 Essential (primary) hypertension: Secondary | ICD-10-CM

## 2012-05-01 DIAGNOSIS — E785 Hyperlipidemia, unspecified: Secondary | ICD-10-CM

## 2012-05-01 DIAGNOSIS — E538 Deficiency of other specified B group vitamins: Secondary | ICD-10-CM

## 2012-05-01 DIAGNOSIS — Z0000 Encounter for general adult medical examination without abnormal findings: Secondary | ICD-10-CM

## 2012-05-01 MED ORDER — FENOFIBRATE MICRONIZED 130 MG PO CAPS: 130.0000 mg | ORAL_CAPSULE | Freq: Every day | ORAL | Status: DC

## 2012-05-01 MED ORDER — AMITRIPTYLINE HCL 25 MG PO TABS: 25.0000 mg | ORAL_TABLET | Freq: Every day | ORAL | Status: DC

## 2012-05-01 MED ORDER — ROSUVASTATIN CALCIUM 10 MG PO TABS: 10.0000 mg | ORAL_TABLET | ORAL | Status: DC

## 2012-05-01 NOTE — Patient Instructions (Addendum)
Keep working on [employer] and exercise and weight loss- great job so far  Avoid red meat/ fried foods/ egg yolks/ fatty breakfast meats/ butter, cheese and high fat dairy/ and shellfish   Unfortunately -cholesterol is not improved - I think this is genetic  Don't forget to schedule your mammogram this fall

## 2012-05-01 NOTE — Assessment & Plan Note (Signed)
Reviewed health habits including diet and exercise and skin cancer prevention Also reviewed health mt list, fam hx and immunizations   Reviewed wellness labs in detail  Commended better health habits

## 2012-05-01 NOTE — Assessment & Plan Note (Signed)
Good level on oral supplementation Doing well - will continue

## 2012-05-01 NOTE — Assessment & Plan Note (Signed)
bp in fair control at this time  No changes needed  Disc lifstyle change with low sodium diet and exercise   Is ok without benicar now - good lifestyle change Commended!

## 2012-05-01 NOTE — Assessment & Plan Note (Signed)
Genetic and not resp to diet change  Is taking as much statin as she can tol (has tried them all) , along with antara and also welchol Will continue to monitor and watch for atherosclerosis

## 2012-05-01 NOTE — Progress Notes (Signed)
Subjective:    Patient ID: Beth Fisher, female    DOB: 02/25/1951, 61 y.o.   MRN: [id number]  HPI Here for health maintenance exam and to review chronic medical problems    Wt is down 9 lb with bmi of 34 Is proud of that  Whole diet has changed - and doing well  Is on low sodium diet  Eating fruit and veg/ chicken / no beef or fried   bp is   good  Today BP Readings from Last 3 Encounters:  05/01/12 130/82  02/09/12 132/82  12/29/11 120/80    No cp or palpitations or headaches or edema  No side effects to medicines  -ok off benicar   mammo 10/12- she usually gets a reminder Self exam- no breast lumps   Nl pap 7/12 No problems and no abn paps in the past   colonosc 11/11- still up to date on that - watching colon polyps     Chemistry      Component Value Date/Time   NA 140 04/24/2012 0943   K 4.2 04/24/2012 0943   CL 104 04/24/2012 0943   CO2 25 04/24/2012 0943   BUN 18 04/24/2012 0943   CREATININE 0.8 04/24/2012 0943      Component Value Date/Time   CALCIUM 10.1 04/24/2012 0943   ALKPHOS 44 04/24/2012 0943   AST 22 04/24/2012 0943   ALT 21 04/24/2012 0943   BILITOT 0.8 04/24/2012 0943     Lab Results  Component Value Date   ALT 21 04/24/2012   AST 22 04/24/2012   ALKPHOS 44 04/24/2012   BILITOT 0.8 04/24/2012    Lab Results  Component Value Date   CHOL 258* 04/24/2012   CHOL 231* 11/09/2011   CHOL 198 04/20/2011   Lab Results  Component Value Date   HDL 45.00 04/24/2012   HDL 11.91 11/09/2011   HDL 47.82 04/20/2011   No results found for this basename: LDLCALC   Lab Results  Component Value Date   TRIG 235.0* 04/24/2012   TRIG 113.0 11/09/2011   TRIG 210.0* 04/20/2011   Lab Results  Component Value Date   CHOLHDL 6 04/24/2012   CHOLHDL 5 11/09/2011   CHOLHDL 4 04/20/2011   Lab Results  Component Value Date   LDLDIRECT 174.7 04/24/2012   LDLDIRECT 168.2 11/09/2011   LDLDIRECT 149.3 04/20/2011  takes crestor twice weekly -- and cannot tolerate more statin than  that    B12 ok - will continue current dose   Patient Active Problem List  Diagnosis  . Hypertension  . Hyperlipidemia  . Peptic ulcer disease  . Fibromyalgia  . Colon polyps  . Menopause syndrome  . Routine general medical examination at a health care facility  . Gynecological examination  . Obesity  . Community acquired pneumonia  . B12 deficiency  . Reactive airways dysfunction syndrome  . Anemia   Past Medical History  Diagnosis Date  . Ulcer   . Hypertension   . Hyperlipidemia   . History of UTI   . Fibromyalgia   . Inflammatory polyps of colon     08/2008 OK 08/2010 Ok  byk Dr Kinnie Scales  . Pneumonia 12/2011   Past Surgical History  Procedure Date  . Hernia repair 1978   History  Substance Use Topics  . Smoking status: Former Smoker    Quit date: 10/11/1980  . Smokeless tobacco: Not on file  . Alcohol Use: No   Family History  Problem Relation Age of Onset  .  Alcohol abuse Mother   . Hyperlipidemia Mother   . Alzheimer's disease Mother   . Cancer Mother     breast cancer  . Diabetes Father   . Heart disease Father   . Hyperlipidemia Father   . Mental illness Father   . Alzheimer's disease Father   . Cancer Paternal Grandmother     colon  . Diabetes Paternal Grandmother   . Heart disease Cousin     sudden death less than 96 yrs old heart attack.   Allergies  Allergen Reactions  . Zetia (Ezetimibe) Other (See Comments)    Muscle aches!!!   Current Outpatient Prescriptions on File Prior to Visit  Medication Sig Dispense Refill  . amitriptyline (ELAVIL) 25 MG tablet Take 1 tablet (25 mg total) by mouth at bedtime.  30 tablet  11  . colesevelam (WELCHOL) 625 MG tablet Take 1,875 mg by mouth 2 (two) times daily with a meal. Take 3 tablets by mouth twice a day.      . cyanocobalamin 1000 MCG tablet Take 1,000 mcg by mouth daily.      . famotidine (ACID REDUCER) 10 MG tablet Take 10 mg by mouth daily as needed. OTC as directed.      . fenofibrate  micronized (ANTARA) 130 MG capsule Take 1 capsule (130 mg total) by mouth daily before breakfast.  30 capsule  11  . Krill Oil CAPS Take by mouth. 1 capsule by mouth daily (?Mg)       . rosuvastatin (CRESTOR) 10 MG tablet Take 10 mg by mouth See admin instructions. Takes 1 tablet by mouth on Mon and Thur of each week.      Marland Kitchen aspirin 81 MG tablet Take 81 mg by mouth daily.      . calcium-vitamin D (OSCAL WITH D) 500-200 MG-UNIT per tablet Take 1 tablet by mouth daily.      Marland Kitchen DISCONTD: olmesartan (BENICAR) 20 MG tablet Take 1 tablet (20 mg total) by mouth daily.  30 tablet  11      Review of SystemsReview of Systems  Constitutional: Negative for fever, appetite change, fatigue and unexpected weight change.  Eyes: Negative for pain and visual disturbance.  Respiratory: Negative for cough and shortness of breath.   Cardiovascular: Negative for cp or palpitations    Gastrointestinal: Negative for nausea, diarrhea and constipation.  Genitourinary: Negative for urgency and frequency. pos for decline in libido as she ages  Skin: Negative for pallor or rash   Neurological: Negative for weakness, light-headedness, numbness and headaches.  Hematological: Negative for adenopathy. Does not bruise/bleed easily.  Psychiatric/Behavioral: Negative for dysphoric mood. The patient is not nervous/anxious.         Objective:   Physical Exam  Constitutional: She appears well-developed and well-nourished. No distress.  HENT:  Head: Normocephalic and atraumatic.  Right Ear: External ear normal.  Left Ear: External ear normal.  Nose: Nose normal.  Mouth/Throat: Oropharynx is clear and moist.  Eyes: Conjunctivae and EOM are normal. Pupils are equal, round, and reactive to light. No scleral icterus.  Neck: Normal range of motion. Neck supple. No JVD present. Carotid bruit is not present. No thyromegaly present.  Cardiovascular: Normal rate, regular rhythm, normal heart sounds and intact distal pulses.  Exam  reveals no gallop.   Pulmonary/Chest: Effort normal and breath sounds normal. No respiratory distress. She has no wheezes.  Abdominal: Soft. She exhibits no distension, no abdominal bruit and no mass. There is no tenderness.  Genitourinary: No breast  swelling, tenderness, discharge or bleeding.       Breast exam: No mass, nodules, thickening, tenderness, bulging, retraction, inflamation, nipple discharge or skin changes noted.  No axillary or clavicular LA.  Chaperoned exam.    Musculoskeletal: Normal range of motion. She exhibits no edema and no tenderness.  Lymphadenopathy:    She has no cervical adenopathy.  Neurological: She is alert. She has normal reflexes. No cranial nerve deficit. She exhibits normal muscle tone. Coordination normal.  Skin: Skin is warm and dry. No rash noted. No erythema. No pallor.  Psychiatric: She has a normal mood and affect.          Assessment & Plan:

## 2012-05-03 ENCOUNTER — Other Ambulatory Visit: Payer: Self-pay | Admitting: Family Medicine

## 2012-05-03 NOTE — Telephone Encounter (Signed)
Received a request from the pharmacy for Welchol 625 mg. Last time this Rx was filled was 04/27/11. Last labs done on 04/24/12. Ok to refill?

## 2012-05-03 NOTE — Telephone Encounter (Signed)
Will refill electronically  

## 2012-07-31 ENCOUNTER — Ambulatory Visit (INDEPENDENT_AMBULATORY_CARE_PROVIDER_SITE_OTHER): Payer: PRIVATE HEALTH INSURANCE | Admitting: Family Medicine

## 2012-07-31 ENCOUNTER — Encounter: Payer: Self-pay | Admitting: Family Medicine

## 2012-07-31 VITALS — BP 142/90 | HR 84 | Temp 98.4°F | Ht 67.0 in | Wt 213.0 lb

## 2012-07-31 DIAGNOSIS — J069 Acute upper respiratory infection, unspecified: Secondary | ICD-10-CM

## 2012-07-31 MED ORDER — GUAIFENESIN-CODEINE 100-10 MG/5ML PO SYRP: 5.0000 mL | ORAL_SOLUTION | Freq: Every evening | ORAL | Status: DC | PRN

## 2012-07-31 NOTE — Progress Notes (Signed)
Subjective:    Patient ID: Beth Fisher, female    DOB: Nov 05, 1950, 61 y.o.   MRN: [id number]  HPI Here with uri symptoms St for 5 d Laryngitis 2 d Prod cough with green discharge  No wheezing   Ears hurt and are full  Some sinus pressure  Stuffy nose  No fever   otc- day quil and ny quil tabs - help a bit    Patient Active Problem List  Diagnosis  . Hypertension  . Hyperlipidemia  . Peptic ulcer disease  . Fibromyalgia  . Colon polyps  . Menopause syndrome  . Routine general medical examination at a health care facility  . Gynecological examination  . Obesity  . Community acquired pneumonia  . B12 deficiency  . Reactive airways dysfunction syndrome  . Anemia   Past Medical History  Diagnosis Date  . Ulcer   . Hypertension   . Hyperlipidemia   . History of UTI   . Fibromyalgia   . Inflammatory polyps of colon     08/2008 OK 08/2010 Ok  byk Dr Kinnie Scales  . Pneumonia 12/2011   Past Surgical History  Procedure Date  . Hernia repair 1978   History  Substance Use Topics  . Smoking status: Former Smoker    Quit date: 10/11/1980  . Smokeless tobacco: Not on file  . Alcohol Use: No   Family History  Problem Relation Age of Onset  . Alcohol abuse Mother   . Hyperlipidemia Mother   . Alzheimer's disease Mother   . Cancer Mother     breast cancer  . Diabetes Father   . Heart disease Father   . Hyperlipidemia Father   . Mental illness Father   . Alzheimer's disease Father   . Cancer Paternal Grandmother     colon  . Diabetes Paternal Grandmother   . Heart disease Cousin     sudden death less than 21 yrs old heart attack.   Allergies  Allergen Reactions  . Zetia (Ezetimibe) Other (See Comments)    Muscle aches!!!   Current Outpatient Prescriptions on File Prior to Visit  Medication Sig Dispense Refill  . amitriptyline (ELAVIL) 25 MG tablet Take 1 tablet (25 mg total) by mouth at bedtime.  30 tablet  11  . aspirin 81 MG tablet Take 81 mg by mouth  daily.      . calcium-vitamin D (OSCAL WITH D) 500-200 MG-UNIT per tablet Take 1 tablet by mouth daily.      . cyanocobalamin 1000 MCG tablet Take 1,000 mcg by mouth daily.      . famotidine (ACID REDUCER) 10 MG tablet Take 10 mg by mouth daily as needed. OTC as directed.      . fenofibrate micronized (ANTARA) 130 MG capsule Take 1 capsule (130 mg total) by mouth daily before breakfast.  30 capsule  11  . Krill Oil CAPS Take by mouth. 1 capsule by mouth daily (?Mg)       . rosuvastatin (CRESTOR) 10 MG tablet Take 1 tablet (10 mg total) by mouth See admin instructions. Takes 1 tablet by mouth on Mon and Thur of each week.  24 tablet  3  . WELCHOL 625 MG tablet take 3 tablets by mouth twice a day  180 tablet  11  . DISCONTD: olmesartan (BENICAR) 20 MG tablet Take 1 tablet (20 mg total) by mouth daily.  30 tablet  11      Review of Systems Review of Systems  Constitutional: Negative for fever,  appetite change,  and unexpected weight change. pos for fatigue  Eyes: Negative for pain and visual disturbance.neg for redness/ discharge ENT pos for cong and post nasal drip   Respiratory: Negative for wheeze and shortness of breath.   Cardiovascular: Negative for cp or palpitations    Gastrointestinal: Negative for nausea, diarrhea and constipation.  Genitourinary: Negative for urgency and frequency.  Skin: Negative for pallor or rash   Neurological: Negative for weakness, light-headedness, numbness and headaches.  Hematological: Negative for adenopathy. Does not bruise/bleed easily.  Psychiatric/Behavioral: Negative for dysphoric mood. The patient is not nervous/anxious.         Objective:   Physical Exam  Constitutional: She appears well-developed and well-nourished. No distress.       obese and well appearing   HENT:  Head: Normocephalic and atraumatic.  Right Ear: External ear normal.  Left Ear: External ear normal.  Mouth/Throat: Oropharynx is clear and moist. No oropharyngeal exudate.         Nares are injected and congested  No facial tenderness  Eyes: Conjunctivae normal and EOM are normal. Pupils are equal, round, and reactive to light. Right eye exhibits no discharge. Left eye exhibits no discharge.  Neck: Normal range of motion. Neck supple.  Cardiovascular: Normal rate, regular rhythm and normal heart sounds.   Pulmonary/Chest: Effort normal and breath sounds normal. No respiratory distress. She has no wheezes. She has no rales. She exhibits no tenderness.       Harsh bs  No rhonchi or wheeze Cough is dry sounding  Slight hoarseness  Lymphadenopathy:    She has no cervical adenopathy.  Neurological: She is alert.  Skin: Skin is warm and dry. No rash noted.  Psychiatric: She has a normal mood and affect.          Assessment & Plan:

## 2012-07-31 NOTE — Assessment & Plan Note (Signed)
In pt who has had pneumonia in the past  I suspect this is viral with a very reassuring exam  Disc symptomatic care - see instructions on AVS  (will try some robitussin ac for night time) Also disc viral illnesses and given handout on uris Update if not starting to improve in a week or if worsening

## 2012-07-31 NOTE — Patient Instructions (Addendum)
Drink lots of fluids popcicles and losenges and water are good for sore throat  Tylenol is ok also  Take mucinex DM during the day for cough and congestion  Take the cough syrup with codeine at night Breathe steam for nasal congestion Get lots of rest and drink fluids  Update if not starting to improve in a week or if worsening  -- especially if fever/ severe sore throat/ worse cough / wheezing

## 2012-08-04 ENCOUNTER — Telehealth: Payer: Self-pay | Admitting: Family Medicine

## 2012-08-04 NOTE — Telephone Encounter (Signed)
Ok -she will be seen then

## 2012-08-04 NOTE — Telephone Encounter (Signed)
Caller: Ashaya/Patient; Patient Name: Beth Fisher; PCP: Roxy Manns [facility]); Best Callback Phone Number: [phone] Was seen in office 10-21 due to sore throat. Was diagnosed with virus. States was told to call back if not improving. COntinues to have sore throat/green nasal drainage 10-25. Intermittent cough due to drainage. When coughs has green mucus that is "thick and dark green". Afebrile. Has been using Mucinex DM as directed. Advised appointment 10-26 and scheduled for 0800 at [facility] office.

## 2012-08-04 NOTE — Telephone Encounter (Signed)
Notified patient that appointment is for 10-26 at 1000 instead of 0800 as was placed in error. Patient in agreement.

## 2012-08-05 ENCOUNTER — Encounter: Payer: Self-pay | Admitting: Family Medicine

## 2012-08-05 ENCOUNTER — Ambulatory Visit (INDEPENDENT_AMBULATORY_CARE_PROVIDER_SITE_OTHER): Payer: PRIVATE HEALTH INSURANCE | Admitting: Family Medicine

## 2012-08-05 ENCOUNTER — Ambulatory Visit: Payer: PRIVATE HEALTH INSURANCE | Admitting: General Practice

## 2012-08-05 VITALS — BP 140/90 | HR 78 | Temp 98.3°F | Wt 190.0 lb

## 2012-08-05 DIAGNOSIS — J069 Acute upper respiratory infection, unspecified: Secondary | ICD-10-CM

## 2012-08-05 MED ORDER — AZITHROMYCIN 250 MG PO TABS: ORAL_TABLET | ORAL | Status: DC

## 2012-08-05 NOTE — Progress Notes (Signed)
Subjective:    Patient ID: Beth Fisher, female    DOB: October 03, 1951, 61 y.o.   MRN: [id number]  HPI 61 yo here for persistent URI symptoms.  Saw Dr. Milinda Antis on 10/21- diagnosed with viral URI.   Taking Mucinex Dm as directed but now she feels her chest is "rattling."  Cough is more productive.  No fever but increased malaise.  She is very nervous about the progression of her symptoms because she has a h/o PNA this summer.      Patient Active Problem List  Diagnosis  . Hypertension  . Hyperlipidemia  . Peptic ulcer disease  . Fibromyalgia  . Colon polyps  . Menopause syndrome  . Routine general medical examination at a health care facility  . Gynecological examination  . Obesity  . Community acquired pneumonia  . B12 deficiency  . Reactive airways dysfunction syndrome  . Anemia  . Viral URI with cough   Past Medical History  Diagnosis Date  . Ulcer   . Hypertension   . Hyperlipidemia   . History of UTI   . Fibromyalgia   . Inflammatory polyps of colon     08/2008 OK 08/2010 Ok  byk Dr Kinnie Scales  . Pneumonia 12/2011   Past Surgical History  Procedure Date  . Hernia repair 1978   History  Substance Use Topics  . Smoking status: Former Smoker    Quit date: 10/11/1980  . Smokeless tobacco: Not on file  . Alcohol Use: No   Family History  Problem Relation Age of Onset  . Alcohol abuse Mother   . Hyperlipidemia Mother   . Alzheimer's disease Mother   . Cancer Mother     breast cancer  . Diabetes Father   . Heart disease Father   . Hyperlipidemia Father   . Mental illness Father   . Alzheimer's disease Father   . Cancer Paternal Grandmother     colon  . Diabetes Paternal Grandmother   . Heart disease Cousin     sudden death less than 32 yrs old heart attack.   Allergies  Allergen Reactions  . Zetia (Ezetimibe) Other (See Comments)    Muscle aches!!!   Current Outpatient Prescriptions on File Prior to Visit  Medication Sig Dispense Refill  . amitriptyline  (ELAVIL) 25 MG tablet Take 1 tablet (25 mg total) by mouth at bedtime.  30 tablet  11  . aspirin 81 MG tablet Take 81 mg by mouth daily.      . calcium-vitamin D (OSCAL WITH D) 500-200 MG-UNIT per tablet Take 1 tablet by mouth daily.      . cyanocobalamin 1000 MCG tablet Take 1,000 mcg by mouth daily.      . famotidine (ACID REDUCER) 10 MG tablet Take 10 mg by mouth daily as needed. OTC as directed.      . fenofibrate micronized (ANTARA) 130 MG capsule Take 1 capsule (130 mg total) by mouth daily before breakfast.  30 capsule  11  . guaiFENesin-codeine (ROBITUSSIN AC) 100-10 MG/5ML syrup Take 5 mLs by mouth at bedtime as needed for cough.  120 mL  0  . Krill Oil CAPS Take by mouth. 1 capsule by mouth daily (?Mg)       . rosuvastatin (CRESTOR) 10 MG tablet Take 1 tablet (10 mg total) by mouth See admin instructions. Takes 1 tablet by mouth on Mon and Thur of each week.  24 tablet  3  . WELCHOL 625 MG tablet take 3 tablets by mouth twice  a day  180 tablet  11  . DISCONTD: olmesartan (BENICAR) 20 MG tablet Take 1 tablet (20 mg total) by mouth daily.  30 tablet  11      Review of Systems Review of Systems  Constitutional: Negative for fever, appetite change,  and unexpected weight change. pos for fatigue  Eyes: Negative for pain and visual disturbance.neg for redness/ discharge ENT pos for cong and post nasal drip   Respiratory: Negative for wheeze and shortness of breath.   Cardiovascular: Negative for cp or palpitations    Gastrointestinal: Negative for nausea, diarrhea and constipation.  Genitourinary: Negative for urgency and frequency.  Skin: Negative for pallor or rash   Neurological: Negative for weakness, light-headedness, numbness and headaches.  Hematological: Negative for adenopathy. Does not bruise/bleed easily.  Psychiatric/Behavioral: Negative for dysphoric mood. The patient is not nervous/anxious.         Objective:   Physical Exam  BP 140/90  Pulse 78  Temp 98.3 F (36.8  C) (Oral)  Wt 190 lb (86.183 kg)  LMP 10/11/2000  Constitutional: She appears well-developed and well-nourished. No distress.       obese and well appearing   HENT:  Head: Normocephalic and atraumatic.  Right Ear: External ear normal.  Left Ear: External ear normal.  Mouth/Throat: Oropharynx is clear and moist. No oropharyngeal exudate.       Nares are injected and congested  Eyes: Conjunctivae normal and EOM are normal. Pupils are equal, round, and reactive to light. Right eye exhibits no discharge. Left eye exhibits no discharge.  Neck: Normal range of motion. Neck supple.  Cardiovascular: Normal rate, regular rhythm and normal heart sounds.   Pulmonary/Chest: Effort normal and breath sounds normal. No respiratory distress. She has no wheezes. She has no rales. She exhibits no tenderness.       Harsh bs  + rhonchi right lower lung  Lymphadenopathy:    She has no cervical adenopathy.  Neurological: She is alert.  Skin: Skin is warm and dry. No rash noted.  Psychiatric: She has a normal mood and affect.       Assessment & Plan:  1.  URI- Now with rhonchi on exam. Given history of PNA and new findings on exam, will treat for bronchitis/CAP with zpack. Continue mucinex DM. The patient indicates understanding of these issues and agrees with the plan.

## 2012-08-05 NOTE — Addendum Note (Signed)
Addended by: Kern Reap B on: 08/05/2012 11:40 AM   Modules accepted: Orders

## 2012-08-05 NOTE — Patient Instructions (Addendum)
Nice to meet you.  Drink lots of fluids popcicles and losenges and water are good for sore throat  Tylenol is ok also  Continue mucinex DM during the day for cough and congestion  Take Zpack as directed.  Call Dr. Milinda Antis next week with an update of your symptoms.

## 2012-08-10 ENCOUNTER — Encounter: Payer: Self-pay | Admitting: Family Medicine

## 2012-08-14 ENCOUNTER — Encounter: Payer: Self-pay | Admitting: *Deleted

## 2013-03-18 ENCOUNTER — Other Ambulatory Visit: Payer: Self-pay | Admitting: Family Medicine

## 2013-04-16 ENCOUNTER — Other Ambulatory Visit: Payer: Self-pay | Admitting: Family Medicine

## 2013-04-16 NOTE — Telephone Encounter (Signed)
Please give her 1 mo refill -she has appt with me later this mo

## 2013-04-16 NOTE — Telephone Encounter (Signed)
done

## 2013-04-16 NOTE — Telephone Encounter (Signed)
Electronic refill request, please advise  

## 2013-04-24 ENCOUNTER — Telehealth: Payer: Self-pay | Admitting: Family Medicine

## 2013-04-24 DIAGNOSIS — E538 Deficiency of other specified B group vitamins: Secondary | ICD-10-CM

## 2013-04-24 DIAGNOSIS — E785 Hyperlipidemia, unspecified: Secondary | ICD-10-CM

## 2013-04-24 DIAGNOSIS — Z0000 Encounter for general adult medical examination without abnormal findings: Secondary | ICD-10-CM

## 2013-04-24 NOTE — Telephone Encounter (Signed)
Message copied by Judy Pimple on Tue Apr 24, 2013 10:19 PM ------      Message from: Alvina Chou      Created: Mon Apr 23, 2013 10:23 AM      Regarding: Lab orders for Wednesday, 7.16.14       Patient is scheduled for CPX labs, please order future labs, Thanks , Terri       ------

## 2013-04-25 ENCOUNTER — Other Ambulatory Visit (INDEPENDENT_AMBULATORY_CARE_PROVIDER_SITE_OTHER): Payer: PRIVATE HEALTH INSURANCE

## 2013-04-25 DIAGNOSIS — E669 Obesity, unspecified: Secondary | ICD-10-CM

## 2013-04-25 DIAGNOSIS — E538 Deficiency of other specified B group vitamins: Secondary | ICD-10-CM

## 2013-04-25 DIAGNOSIS — Z0000 Encounter for general adult medical examination without abnormal findings: Secondary | ICD-10-CM

## 2013-04-25 DIAGNOSIS — I10 Essential (primary) hypertension: Secondary | ICD-10-CM

## 2013-04-25 DIAGNOSIS — D649 Anemia, unspecified: Secondary | ICD-10-CM

## 2013-04-25 DIAGNOSIS — E785 Hyperlipidemia, unspecified: Secondary | ICD-10-CM

## 2013-04-25 LAB — COMPREHENSIVE METABOLIC PANEL
ALT: 26 U/L (ref 0–35)
AST: 21 U/L (ref 0–37)
Albumin: 4.6 g/dL (ref 3.5–5.2)
Alkaline Phosphatase: 40 U/L (ref 39–117)
BUN: 15 mg/dL (ref 6–23)
CO2: 25 mEq/L (ref 19–32)
Calcium: 10.1 mg/dL (ref 8.4–10.5)
Chloride: 107 mEq/L (ref 96–112)
Creatinine, Ser: 0.9 mg/dL (ref 0.4–1.2)
GFR: 69.1 mL/min (ref >60.00)
Glucose, Bld: 91 mg/dL (ref 70–99)
Potassium: 3.9 mEq/L (ref 3.5–5.1)
Sodium: 140 mEq/L (ref 135–145)
Total Bilirubin: 0.7 mg/dL (ref 0.3–1.2)
Total Protein: 7.8 g/dL (ref 6.0–8.3)

## 2013-04-25 LAB — CBC WITH DIFFERENTIAL/PLATELET
Basophils Absolute: 0 10*3/uL (ref 0.0–0.1)
Basophils Relative: 0.6 % (ref 0.0–3.0)
Eosinophils Absolute: 0.2 10*3/uL (ref 0.0–0.7)
Eosinophils Relative: 3.1 % (ref 0.0–5.0)
HCT: 41.6 % (ref 36.0–46.0)
Hemoglobin: 14.2 g/dL (ref 12.0–15.0)
Lymphocytes Relative: 37.7 % (ref 12.0–46.0)
Lymphs Abs: 2 10*3/uL (ref 0.7–4.0)
MCHC: 34.2 g/dL (ref 30.0–36.0)
MCV: 88.3 fl (ref 78.0–100.0)
Monocytes Absolute: 0.5 10*3/uL (ref 0.1–1.0)
Monocytes Relative: 10.1 % (ref 3.0–12.0)
Neutro Abs: 2.5 10*3/uL (ref 1.4–7.7)
Neutrophils Relative %: 48.5 % (ref 43.0–77.0)
Platelets: 335 10*3/uL (ref 150.0–400.0)
RBC: 4.71 Mil/uL (ref 3.87–5.11)
RDW: 13.4 % (ref 11.5–14.6)
WBC: 5.2 10*3/uL (ref 4.5–10.5)

## 2013-04-25 LAB — VITAMIN B12: Vitamin B-12: 688 pg/mL (ref 211–911)

## 2013-04-25 LAB — LIPID PANEL
Cholesterol: 249 mg/dL — ABNORMAL HIGH (ref 0–200)
HDL: 45 mg/dL (ref >39.00)
Total CHOL/HDL Ratio: 6
Triglycerides: 233 mg/dL — ABNORMAL HIGH (ref 0.0–149.0)
VLDL: 46.6 mg/dL — ABNORMAL HIGH (ref 0.0–40.0)

## 2013-04-25 LAB — LDL CHOLESTEROL, DIRECT: Direct LDL: 179.3 mg/dL

## 2013-04-25 LAB — TSH: TSH: 4.1 u[IU]/mL (ref 0.35–5.50)

## 2013-05-02 ENCOUNTER — Encounter: Payer: Self-pay | Admitting: Family Medicine

## 2013-05-02 ENCOUNTER — Ambulatory Visit (INDEPENDENT_AMBULATORY_CARE_PROVIDER_SITE_OTHER): Payer: PRIVATE HEALTH INSURANCE | Admitting: Family Medicine

## 2013-05-02 VITALS — BP 130/82 | HR 82 | Temp 98.4°F | Ht 66.25 in | Wt 220.8 lb

## 2013-05-02 DIAGNOSIS — E538 Deficiency of other specified B group vitamins: Secondary | ICD-10-CM

## 2013-05-02 DIAGNOSIS — Z0000 Encounter for general adult medical examination without abnormal findings: Secondary | ICD-10-CM

## 2013-05-02 DIAGNOSIS — E785 Hyperlipidemia, unspecified: Secondary | ICD-10-CM

## 2013-05-02 DIAGNOSIS — E669 Obesity, unspecified: Secondary | ICD-10-CM

## 2013-05-02 DIAGNOSIS — I10 Essential (primary) hypertension: Secondary | ICD-10-CM

## 2013-05-02 MED ORDER — AMITRIPTYLINE HCL 25 MG PO TABS: 25.0000 mg | ORAL_TABLET | Freq: Every day | ORAL | Status: DC

## 2013-05-02 MED ORDER — FENOFIBRATE MICRONIZED 130 MG PO CAPS: 130.0000 mg | ORAL_CAPSULE | Freq: Every day | ORAL | Status: DC

## 2013-05-02 MED ORDER — ROSUVASTATIN CALCIUM 10 MG PO TABS: ORAL_TABLET | ORAL | Status: DC

## 2013-05-02 MED ORDER — COLESEVELAM HCL 625 MG PO TABS: ORAL_TABLET | ORAL | Status: DC

## 2013-05-02 NOTE — Patient Instructions (Addendum)
Work on weight loss for better health  Cut portions and make healthier choices  The DASH diet is a good guideline- consider that a permanent lifestyle change  Get at least 30 minutes of exercise 5 or more days per week

## 2013-05-02 NOTE — Progress Notes (Signed)
Subjective:    Patient ID: Beth Fisher, female    DOB: [birth year], 62 y.o.   MRN: [id number]  HPI Here for health maintenance exam and to review chronic medical problems    Wt is up 7 lb with bmi of 35  Last time the scale was broken but this uses the one before  In past the dash diet really helped  Is walking for exercise at least 20 min every day  Flu vaccine 11/13 Mammogram 11/13 nl Self exam-no lumps or changes  Pap 7/12 normal- no abn paps or gyn problems  Td 7/10 colonosc 11/11- 5 y recall for polyps  Zoster vacc 10/12  Lipids Lab Results  Component Value Date   CHOL 249* 04/25/2013   CHOL 258* 04/24/2012   CHOL 231* 11/09/2011   Lab Results  Component Value Date   HDL 45.00 04/25/2013   HDL 40.98 04/24/2012   HDL 11.91 11/09/2011   No results found for this basename: LDLCALC   Lab Results  Component Value Date   TRIG 233.0* 04/25/2013   TRIG 235.0* 04/24/2012   TRIG 113.0 11/09/2011   Lab Results  Component Value Date   CHOLHDL 6 04/25/2013   CHOLHDL 6 04/24/2012   CHOLHDL 5 11/09/2011   Lab Results  Component Value Date   LDLDIRECT 179.3 04/25/2013   LDLDIRECT 174.7 04/24/2012   LDLDIRECT 168.2 11/09/2011  overall cholestesrol is about the same  crestor (2d per week) and antera and welchol  She does stay away from cholesterol in diet   B12 def in past Lab Results  Component Value Date   VITAMINB12 688 04/25/2013    bp is stable today  No cp or palpitations or headaches or edema  No side effects to medicines  BP Readings from Last 3 Encounters:  05/02/13 130/82  08/05/12 140/90  07/31/12 142/90      Patient Active Problem List   Diagnosis Date Noted  . B12 deficiency 12/29/2011  . 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 Anemia 12/29/2011  . Obesity 11/09/2011  . Gynecological examination 04/27/2011  . Routine general medical examination at a health care facility 04/20/2011  . Hypertension 01/12/2011  . Hyperlipidemia 01/12/2011  .  Peptic ulcer disease 01/12/2011  . Fibromyalgia 01/12/2011  . Colon polyps 01/12/2011  . Menopause syndrome 01/12/2011   Past Medical History  Diagnosis Date  . Ulcer   . Hypertension   . Hyperlipidemia   . History of UTI   . Fibromyalgia   . Inflammatory polyps of colon     08/2008 OK 08/2010 Ok  byk Dr Kinnie Scales  . Pneumonia 12/2011   Past Surgical History  Procedure Laterality Date  . Hernia repair  1978   History  Substance Use Topics  . Smoking status: Former Smoker    Quit date: 10/11/1980  . Smokeless tobacco: Not on file  . Alcohol Use: No   Family History  Problem Relation Age of Onset  . Hyperlipidemia Mother   . Alzheimer's disease Mother   . Cancer Mother     breast cancer  . Diabetes Father   . Heart disease Father   . Hyperlipidemia Father   . Mental illness Father   . Alzheimer's disease Father   . Cancer Paternal Grandmother     colon  . Diabetes Paternal Grandmother   . Heart disease Cousin     sudden death less than 10 yrs old heart attack.   Allergies  Allergen Reactions  . Zetia (Ezetimibe) Other (See  Comments)    Muscle aches!!!   Current Outpatient Prescriptions on File Prior to Visit  Medication Sig Dispense Refill  . cyanocobalamin 1000 MCG tablet Take 1,000 mcg by mouth daily.      . famotidine (ACID REDUCER) 10 MG tablet Take 10 mg by mouth daily as needed. OTC as directed.      Boris Lown Oil CAPS Take by mouth. 1 capsule by mouth daily (?Mg)       . [DISCONTINUED] olmesartan (BENICAR) 20 MG tablet Take 1 tablet (20 mg total) by mouth daily.  30 tablet  11   No current facility-administered medications on file prior to visit.    Review of Systems Review of Systems  Constitutional: Negative for fever, appetite change, fatigue and unexpected weight change.  Eyes: Negative for pain and visual disturbance.  Respiratory: Negative for cough and shortness of breath.   Cardiovascular: Negative for cp or palpitations    Gastrointestinal:  Negative for nausea, diarrhea and constipation.  Genitourinary: Negative for urgency and frequency.  Skin: Negative for pallor or rash   Neurological: Negative for weakness, light-headedness, numbness and headaches.  Hematological: Negative for adenopathy. Does not bruise/bleed easily.  Psychiatric/Behavioral: Negative for dysphoric mood. The patient is not nervous/anxious.         Objective:   Physical Exam  Constitutional: She appears well-developed and well-nourished. No distress.  obese and well appearing   HENT:  Head: Normocephalic and atraumatic.  Right Ear: External ear normal.  Left Ear: External ear normal.  Nose: Nose normal.  Mouth/Throat: Oropharynx is clear and moist.  Eyes: Conjunctivae and EOM are normal. Pupils are equal, round, and reactive to light. Right eye exhibits no discharge. Left eye exhibits no discharge. No scleral icterus.  Neck: Normal range of motion. Neck supple. No thyromegaly present.  Cardiovascular: Normal rate, regular rhythm, normal heart sounds and intact distal pulses.  Exam reveals no gallop.   Pulmonary/Chest: Effort normal and breath sounds normal. No respiratory distress. She has no wheezes. She has no rales.  Abdominal: Soft. Bowel sounds are normal. She exhibits no distension. There is no tenderness. There is no rebound.  Genitourinary: No breast swelling, tenderness, discharge or bleeding.  Breast exam: No mass, nodules, thickening, tenderness, bulging, retraction, inflamation, nipple discharge or skin changes noted.  No axillary or clavicular LA.  Chaperoned exam.    Musculoskeletal: Normal range of motion. She exhibits no edema and no tenderness.  Lymphadenopathy:    She has no cervical adenopathy.  Neurological: She is alert. She has normal reflexes. No cranial nerve deficit. She exhibits normal muscle tone. Coordination normal.  Skin: Skin is warm and dry. No rash noted. No erythema. No pallor.  Some lentigos  Psychiatric: She has a  normal mood and affect.          Assessment & Plan:

## 2013-05-03 NOTE — Assessment & Plan Note (Signed)
Disc goals for lipids and reasons to control them Rev labs with pt Rev low sat fat diet in detail  Unable to tolerate much statin -will continue her 2 times weekly crestor and welchol

## 2013-05-03 NOTE — Assessment & Plan Note (Signed)
bp in fair control at this time  No changes needed  Disc lifstyle change with low sodium diet and exercise  Will work on wt loss and DASH diet

## 2013-05-03 NOTE — Assessment & Plan Note (Signed)
Discussed how this problem influences overall health and the risks it imposes  Reviewed plan for weight loss with lower calorie diet (via better food choices and also portion control or program like weight watchers) and exercise building up to or more than 30 minutes 5 days per week including some aerobic activity   Pt likes to work with DASH diet and exercise- plans on that

## 2013-05-03 NOTE — Assessment & Plan Note (Signed)
Level is ok with current oral supplementation

## 2013-05-03 NOTE — Assessment & Plan Note (Signed)
Reviewed health habits including diet and exercise and skin cancer prevention Also reviewed health mt list, fam hx and immunizations  Wellness labs rev Enc wt loss

## 2013-08-16 ENCOUNTER — Other Ambulatory Visit: Payer: Self-pay

## 2013-08-16 ENCOUNTER — Encounter: Payer: Self-pay | Admitting: Family Medicine

## 2014-04-22 ENCOUNTER — Other Ambulatory Visit: Payer: Self-pay | Admitting: Family Medicine

## 2014-04-22 NOTE — Telephone Encounter (Signed)
done

## 2014-04-22 NOTE — Telephone Encounter (Signed)
Electronic refill request, please advise  

## 2014-04-22 NOTE — Telephone Encounter (Signed)
Please refill for a month-she has appt later this mo

## 2014-04-30 ENCOUNTER — Telehealth: Payer: Self-pay | Admitting: Family Medicine

## 2014-04-30 DIAGNOSIS — Z0000 Encounter for general adult medical examination without abnormal findings: Secondary | ICD-10-CM

## 2014-04-30 DIAGNOSIS — E538 Deficiency of other specified B group vitamins: Secondary | ICD-10-CM

## 2014-04-30 NOTE — Telephone Encounter (Signed)
Message copied by Abner Greenspan on Tue Apr 30, 2014  9:34 PM ------      Message from: Ellamae Sia      Created: Thu Apr 25, 2014  4:29 PM      Regarding: Lab orders for Wednesday, 7.22.15       Patient is scheduled for CPX labs, please order future labs, Thanks , Terri       ------

## 2014-05-01 ENCOUNTER — Other Ambulatory Visit (INDEPENDENT_AMBULATORY_CARE_PROVIDER_SITE_OTHER): Payer: PRIVATE HEALTH INSURANCE

## 2014-05-01 DIAGNOSIS — Z0000 Encounter for general adult medical examination without abnormal findings: Secondary | ICD-10-CM

## 2014-05-01 DIAGNOSIS — E538 Deficiency of other specified B group vitamins: Secondary | ICD-10-CM

## 2014-05-01 LAB — CBC WITH DIFFERENTIAL/PLATELET
Basophils Absolute: 0 10*3/uL (ref 0.0–0.1)
Basophils Relative: 0.3 % (ref 0.0–3.0)
Eosinophils Absolute: 0.1 10*3/uL (ref 0.0–0.7)
Eosinophils Relative: 3.1 % (ref 0.0–5.0)
HCT: 41 % (ref 36.0–46.0)
Hemoglobin: 13.9 g/dL (ref 12.0–15.0)
Lymphocytes Relative: 41.7 % (ref 12.0–46.0)
Lymphs Abs: 2 10*3/uL (ref 0.7–4.0)
MCHC: 33.8 g/dL (ref 30.0–36.0)
MCV: 89.1 fl (ref 78.0–100.0)
Monocytes Absolute: 0.5 10*3/uL (ref 0.1–1.0)
Monocytes Relative: 9.6 % (ref 3.0–12.0)
Neutro Abs: 2.2 10*3/uL (ref 1.4–7.7)
Neutrophils Relative %: 45.3 % (ref 43.0–77.0)
Platelets: 324 10*3/uL (ref 150.0–400.0)
RBC: 4.6 Mil/uL (ref 3.87–5.11)
RDW: 13.3 % (ref 11.5–15.5)
WBC: 4.8 10*3/uL (ref 4.0–10.5)

## 2014-05-01 LAB — COMPREHENSIVE METABOLIC PANEL
ALT: 27 U/L (ref 0–35)
AST: 25 U/L (ref 0–37)
Albumin: 4.3 g/dL (ref 3.5–5.2)
Alkaline Phosphatase: 42 U/L (ref 39–117)
BUN: 12 mg/dL (ref 6–23)
CO2: 26 mEq/L (ref 19–32)
Calcium: 9.8 mg/dL (ref 8.4–10.5)
Chloride: 107 mEq/L (ref 96–112)
Creatinine, Ser: 0.8 mg/dL (ref 0.4–1.2)
GFR: 79.16 mL/min (ref >60.00)
Glucose, Bld: 97 mg/dL (ref 70–99)
Potassium: 4.4 mEq/L (ref 3.5–5.1)
Sodium: 141 mEq/L (ref 135–145)
Total Bilirubin: 1.3 mg/dL — ABNORMAL HIGH (ref 0.2–1.2)
Total Protein: 7.3 g/dL (ref 6.0–8.3)

## 2014-05-01 LAB — LIPID PANEL
Cholesterol: 232 mg/dL — ABNORMAL HIGH (ref 0–200)
HDL: 40.9 mg/dL (ref >39.00)
NonHDL: 191.1
Total CHOL/HDL Ratio: 6
Triglycerides: 269 mg/dL — ABNORMAL HIGH (ref 0.0–149.0)
VLDL: 53.8 mg/dL — ABNORMAL HIGH (ref 0.0–40.0)

## 2014-05-01 LAB — TSH: TSH: 2.29 u[IU]/mL (ref 0.35–4.50)

## 2014-05-01 LAB — VITAMIN B12: Vitamin B-12: 511 pg/mL (ref 211–911)

## 2014-05-01 LAB — LDL CHOLESTEROL, DIRECT: Direct LDL: 169.4 mg/dL

## 2014-05-08 ENCOUNTER — Other Ambulatory Visit (HOSPITAL_COMMUNITY)
Admission: RE | Admit: 2014-05-08 | Discharge: 2014-05-08 | Disposition: A | Discharge status: Home / Self Care | Payer: PRIVATE HEALTH INSURANCE | Source: Ambulatory Visit | Attending: Family Medicine | Admitting: Family Medicine

## 2014-05-08 ENCOUNTER — Ambulatory Visit (INDEPENDENT_AMBULATORY_CARE_PROVIDER_SITE_OTHER): Payer: PRIVATE HEALTH INSURANCE | Admitting: Family Medicine

## 2014-05-08 ENCOUNTER — Encounter: Payer: Self-pay | Admitting: Family Medicine

## 2014-05-08 VITALS — BP 126/76 | HR 80 | Temp 98.3°F | Ht 66.25 in | Wt 232.8 lb

## 2014-05-08 DIAGNOSIS — I10 Essential (primary) hypertension: Secondary | ICD-10-CM

## 2014-05-08 DIAGNOSIS — R87810 Cervical high risk human papillomavirus (HPV) DNA test positive: Secondary | ICD-10-CM | POA: Insufficient documentation

## 2014-05-08 DIAGNOSIS — E785 Hyperlipidemia, unspecified: Secondary | ICD-10-CM

## 2014-05-08 DIAGNOSIS — Z01419 Encounter for gynecological examination (general) (routine) without abnormal findings: Secondary | ICD-10-CM | POA: Insufficient documentation

## 2014-05-08 DIAGNOSIS — E669 Obesity, unspecified: Secondary | ICD-10-CM

## 2014-05-08 DIAGNOSIS — Z0000 Encounter for general adult medical examination without abnormal findings: Secondary | ICD-10-CM | POA: Diagnosis not present

## 2014-05-08 DIAGNOSIS — Z1151 Encounter for screening for human papillomavirus (HPV): Secondary | ICD-10-CM | POA: Insufficient documentation

## 2014-05-08 DIAGNOSIS — E538 Deficiency of other specified B group vitamins: Secondary | ICD-10-CM | POA: Diagnosis not present

## 2014-05-08 MED ORDER — FENOFIBRATE MICRONIZED 130 MG PO CAPS: 130.0000 mg | ORAL_CAPSULE | Freq: Every day | ORAL | Status: DC

## 2014-05-08 MED ORDER — AMITRIPTYLINE HCL 25 MG PO TABS: ORAL_TABLET | ORAL | Status: DC

## 2014-05-08 MED ORDER — ROSUVASTATIN CALCIUM 10 MG PO TABS: ORAL_TABLET | ORAL | Status: DC

## 2014-05-08 MED ORDER — COLESEVELAM HCL 625 MG PO TABS: ORAL_TABLET | ORAL | Status: DC

## 2014-05-08 NOTE — Assessment & Plan Note (Signed)
Reviewed health habits including diet and exercise and skin cancer prevention Reviewed appropriate screening tests for age  Also reviewed health mt list, fam hx and immunization status , as well as social and family history   See HPI Lab rev  Enc to loose wt

## 2014-05-08 NOTE — Patient Instructions (Signed)
Get back to the DASH diet  Take care of yourself  Really low impact exercise and weight loss  For cholesterol   Avoid red meat/ fried foods/ egg yolks/ fatty breakfast meats/ butter, cheese and high fat dairy/ and shellfish     Fat and Cholesterol Control Diet Fat and cholesterol levels in your blood and organs are influenced by your diet. High levels of fat and cholesterol may lead to diseases of the heart, small and large blood vessels, gallbladder, liver, and pancreas. CONTROLLING FAT AND CHOLESTEROL WITH DIET Although exercise and lifestyle factors are important, your diet is key. That is because certain foods are known to raise cholesterol and others to lower it. The goal is to balance foods for their effect on cholesterol and more importantly, to replace saturated and trans fat with other types of fat, such as monounsaturated fat, polyunsaturated fat, and omega-3 fatty acids. On average, a person should consume no more than 15 to 17 g of saturated fat daily. Saturated and trans fats are considered "bad" fats, and they will raise LDL cholesterol. Saturated fats are primarily found in animal products such as meats, butter, and cream. However, that does not mean you need to give up all your favorite foods. Today, there are good tasting, low-fat, low-cholesterol substitutes for most of the things you like to eat. Choose low-fat or nonfat alternatives. Choose round or loin cuts of red meat. These types of cuts are lowest in fat and cholesterol. Chicken (without the skin), fish, veal, and ground [place] breast are great choices. Eliminate fatty meats, such as hot dogs and salami. Even shellfish have little or no saturated fat. Have a 3 oz (85 g) portion when you eat lean meat, poultry, or fish. Trans fats are also called "partially hydrogenated oils." They are oils that have been scientifically manipulated so that they are solid at room temperature resulting in a longer shelf life and improved taste and  texture of foods in which they are added. Trans fats are found in stick margarine, some tub margarines, cookies, crackers, and baked goods.  When baking and cooking, oils are a great substitute for butter. The monounsaturated oils are especially beneficial since it is believed they lower LDL and raise HDL. The oils you should avoid entirely are saturated tropical oils, such as coconut and palm.  Remember to eat a lot from food groups that are naturally free of saturated and trans fat, including fish, fruit, vegetables, beans, grains (barley, Candella, couscous, bulgur wheat), and pasta (without cream sauces).  IDENTIFYING FOODS THAT LOWER FAT AND CHOLESTEROL  Soluble fiber may lower your cholesterol. This type of fiber is found in fruits such as apples, vegetables such as broccoli, potatoes, and carrots, legumes such as beans, peas, and lentils, and grains such as barley. Foods fortified with plant sterols (phytosterol) may also lower cholesterol. You should eat at least 2 g per day of these foods for a cholesterol lowering effect.  Read package labels to identify low-saturated fats, trans fat free, and low-fat foods at the supermarket. Select cheeses that have only 2 to 3 g saturated fat per ounce. Use a heart-healthy tub margarine that is free of trans fats or partially hydrogenated oil. When buying baked goods (cookies, crackers), avoid partially hydrogenated oils. Breads and muffins should be made from whole grains (whole-wheat or whole oat flour, instead of "flour" or "enriched flour"). Buy non-creamy canned soups with reduced salt and no added fats.  FOOD PREPARATION TECHNIQUES  Never deep-fry. If you  must fry, either stir-fry, which uses very little fat, or use non-stick cooking sprays. When possible, broil, bake, or roast meats, and steam vegetables. Instead of putting butter or margarine on vegetables, use lemon and herbs, applesauce, and cinnamon (for squash and sweet potatoes). Use nonfat yogurt,  salsa, and low-fat dressings for salads.  LOW-SATURATED FAT / LOW-FAT FOOD SUBSTITUTES Meats / Saturated Fat (g)  Avoid: Steak, marbled (3 oz/85 g) / 11 g  Choose: Steak, lean (3 oz/85 g) / 4 g  Avoid: Hamburger (3 oz/85 g) / 7 g  Choose: Hamburger, lean (3 oz/85 g) / 5 g  Avoid: Ham (3 oz/85 g) / 6 g  Choose: Ham, lean cut (3 oz/85 g) / 2.4 g  Avoid: Chicken, with skin, dark meat (3 oz/85 g) / 4 g  Choose: Chicken, skin removed, dark meat (3 oz/85 g) / 2 g  Avoid: Chicken, with skin, light meat (3 oz/85 g) / 2.5 g  Choose: Chicken, skin removed, light meat (3 oz/85 g) / 1 g Dairy / Saturated Fat (g)  Avoid: Whole milk (1 cup) / 5 g  Choose: Low-fat milk, 2% (1 cup) / 3 g  Choose: Low-fat milk, 1% (1 cup) / 1.5 g  Choose: Skim milk (1 cup) / 0.3 g  Avoid: Hard cheese (1 oz/28 g) / 6 g  Choose: Skim milk cheese (1 oz/28 g) / 2 to 3 g  Avoid: Cottage cheese, 4% fat (1 cup) / 6.5 g  Choose: Low-fat cottage cheese, 1% fat (1 cup) / 1.5 g  Avoid: Ice cream (1 cup) / 9 g  Choose: Sherbet (1 cup) / 2.5 g  Choose: Nonfat frozen yogurt (1 cup) / 0.3 g  Choose: Frozen fruit bar / trace  Avoid: Whipped cream (1 tbs) / 3.5 g  Choose: Nondairy whipped topping (1 tbs) / 1 g Condiments / Saturated Fat (g)  Avoid: Mayonnaise (1 tbs) / 2 g  Choose: Low-fat mayonnaise (1 tbs) / 1 g  Avoid: Butter (1 tbs) / 7 g  Choose: Extra light margarine (1 tbs) / 1 g  Avoid: Coconut oil (1 tbs) / 11.8 g  Choose: Olive oil (1 tbs) / 1.8 g  Choose: Corn oil (1 tbs) / 1.7 g  Choose: Safflower oil (1 tbs) / 1.2 g  Choose: Sunflower oil (1 tbs) / 1.4 g  Choose: Soybean oil (1 tbs) / 2.4 g  Choose: Canola oil (1 tbs) / 1 g Document Released: 09/27/2005 Document Revised: 01/22/2013 Document Reviewed: 12/26/2013 Exit[employer] Patient Information 2015 [place], [place]. This information is not intended to replace advice given to you by your health care provider. Make sure you discuss any  questions you have with your health care provider.

## 2014-05-08 NOTE — Assessment & Plan Note (Signed)
Disc goals for lipids and reasons to control them Rev labs with pt Rev low sat fat diet in detail Pt unable to tol most meds-is on the only regimen that she can tolerate Given diet handout

## 2014-05-08 NOTE — Assessment & Plan Note (Signed)
Exam done with pap

## 2014-05-08 NOTE — Progress Notes (Signed)
Pre visit review using our clinic review tool, if applicable. No additional management support is needed unless otherwise documented below in the visit note. 

## 2014-05-08 NOTE — Assessment & Plan Note (Signed)
Discussed how this problem influences overall health and the risks it imposes  Reviewed plan for weight loss with lower calorie diet (via better food choices and also portion control or program like weight watchers) and exercise building up to or more than 30 minutes 5 days per week including some aerobic activity    Will have to do low impact exercise with fibromyalgia

## 2014-05-08 NOTE — Assessment & Plan Note (Signed)
bp in fair control at this time  BP Readings from Last 1 Encounters:  05/08/14 126/76   No changes needed Disc lifstyle change with low sodium diet and exercise   Lab reviewed

## 2014-05-08 NOTE — Assessment & Plan Note (Signed)
Level is stable  conitnue current supplementation

## 2014-05-08 NOTE — Progress Notes (Signed)
Subjective:    Patient ID: Beth Fisher, female    DOB: 1951/08/23, 63 y.o.   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  Has fibromyalgia - and that is worse - now affecting her feet also  She is trying to wear more supportive shoes -comes and goes  Little things slow her down   Wt is up 12 lb with bmi of 37 Diet is fair - tries to eat "good things" - and if bad stuff is very small portions  She has used the DASH diet in the past -needs to get back to it   She always has a lot of stress caring for mother with dementia   Pap 7/12 nl  Does not see a gyn  No gyn problems at all    Mammogram 11/14 nl  Self exam- no breast lumps   Flu shot got it in the fall and plans to get it in sept   Td 7/10  colonosc 11/11 polyp 5 year recall   zost 10/12  Hyperlipidemia Lab Results  Component Value Date   CHOL 232* 05/01/2014   CHOL 249* 04/25/2013   CHOL 258* 04/24/2012   Lab Results  Component Value Date   HDL 40.90 05/01/2014   HDL 45.00 04/25/2013   HDL 45.00 04/24/2012   No results found for this basename: LDLCALC   Lab Results  Component Value Date   TRIG 269.0* 05/01/2014   TRIG 233.0* 04/25/2013   TRIG 235.0* 04/24/2012   Lab Results  Component Value Date   CHOLHDL 6 05/01/2014   CHOLHDL 6 04/25/2013   CHOLHDL 6 04/24/2012   Lab Results  Component Value Date   LDLDIRECT 169.4 05/01/2014   LDLDIRECT 179.3 04/25/2013   LDLDIRECT 174.7 04/24/2012   HDL is lower  Overall stable  Unable to tolerate meds    bp is stable today  No cp or palpitations or headaches or edema  No side effects to medicines  BP Readings from Last 3 Encounters:  05/08/14 126/76  05/02/13 130/82  08/05/12 140/90       Patient Active Problem List   Diagnosis Date Noted  . B12 deficiency  12/29/2011  . 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 Anemia 12/29/2011  . Obesity 11/09/2011  . Gynecological examination 04/27/2011  . Routine general medical examination at a health care facility 04/20/2011  . Hypertension 01/12/2011  . Hyperlipidemia 01/12/2011  . Peptic ulcer disease 01/12/2011  . Fibromyalgia 01/12/2011  . Colon polyps 01/12/2011  . Menopause syndrome 01/12/2011   Past Medical History  Diagnosis Date  . Ulcer   . Hypertension   . Hyperlipidemia   . History of UTI   . Fibromyalgia   . Inflammatory polyps of colon     08/2008 OK 08/2010 Ok  byk Dr Earlean Shawl  . Pneumonia 12/2011   Past Surgical History  Procedure Laterality Date  . Hernia repair  1978   History  Substance Use Topics  . Smoking status: Former Smoker    Quit date: 10/11/1980  . Smokeless tobacco: Not on file  . Alcohol Use: No   Family History  Problem Relation Age of Onset  . Hyperlipidemia Mother   . Alzheimer's disease Mother   . Cancer Mother     breast  cancer  . Diabetes Father   . Heart disease Father   . Hyperlipidemia Father   . Mental illness Father   . Alzheimer's disease Father   . Cancer Paternal Grandmother     colon  . Diabetes Paternal Grandmother   . Heart disease Cousin     sudden death less than 28 yrs old heart attack.   Allergies  Allergen Reactions  . Zetia [Ezetimibe] Other (See Comments)    Muscle aches!!!   Current Outpatient Prescriptions on File Prior to Visit  Medication Sig Dispense Refill  . amitriptyline (ELAVIL) 25 MG tablet take 1 tablet by mouth at bedtime  30 tablet  0  . colesevelam (WELCHOL) 625 MG tablet Take 3 tablets by mouth twice daily  180 tablet  11  . CRESTOR 10 MG tablet take 1 tablet by mouth ON MONDAY AND THURSDAY OF EACH WEEK  24 tablet  0  . cyanocobalamin 1000 MCG tablet Take 1,000 mcg by mouth daily.      . famotidine (ACID REDUCER) 10 MG tablet Take 10 mg by mouth daily as needed. OTC as directed.      .  fenofibrate micronized (ANTARA) 130 MG capsule Take 1 capsule (130 mg total) by mouth daily before breakfast.  30 capsule  11  . [DISCONTINUED] olmesartan (BENICAR) 20 MG tablet Take 1 tablet (20 mg total) by mouth daily.  30 tablet  11   No current facility-administered medications on file prior to visit.    Review of Systems    Review of Systems  Constitutional: Negative for fever, appetite change, fatigue and unexpected weight change.  Eyes: Negative for pain and visual disturbance.  Respiratory: Negative for cough and shortness of breath.   Cardiovascular: Negative for cp or palpitations    Gastrointestinal: Negative for nausea, diarrhea and constipation.  Genitourinary: Negative for urgency and frequency.  Skin: Negative for pallor or rash   MSK pos for muscle/ joint pain from fibromyalgia  Neurological: Negative for weakness, light-headedness, numbness and headaches.  Hematological: Negative for adenopathy. Does not bruise/bleed easily.  Psychiatric/Behavioral: Negative for dysphoric mood. The patient is not nervous/anxious.      Objective:   Physical Exam  Constitutional: She appears well-developed and well-nourished. No distress.  obese and well appearing   HENT:  Head: Normocephalic and atraumatic.  Right Ear: External ear normal.  Left Ear: External ear normal.  Mouth/Throat: Oropharynx is clear and moist.  Eyes: Conjunctivae and EOM are normal. Pupils are equal, round, and reactive to light. No scleral icterus.  Neck: Normal range of motion. Neck supple. No JVD present. Carotid bruit is not present. No thyromegaly present.  Cardiovascular: Normal rate, regular rhythm, normal heart sounds and intact distal pulses.  Exam reveals no gallop.   Pulmonary/Chest: Effort normal and breath sounds normal. No respiratory distress. She has no wheezes. She exhibits no tenderness.  Abdominal: Soft. Bowel sounds are normal. She exhibits no distension, no abdominal bruit and no mass.  There is no tenderness.  Genitourinary: Vagina normal and uterus normal. No breast swelling, tenderness, discharge or bleeding. There is no rash, tenderness or lesion on the right labia. There is no rash, tenderness or lesion on the left labia. Uterus is not enlarged and not tender. Cervix exhibits no motion tenderness, no discharge and no friability. Right adnexum displays no mass, no tenderness and no fullness. Left adnexum displays no mass, no tenderness and no fullness. No vaginal discharge found.  Breast exam: No mass, nodules, thickening, tenderness, bulging,  retraction, inflamation, nipple discharge or skin changes noted.  No axillary or clavicular LA.      Musculoskeletal: Normal range of motion. She exhibits no edema and no tenderness.  Lymphadenopathy:    She has no cervical adenopathy.  Neurological: She is alert. She has normal reflexes. No cranial nerve deficit. She exhibits normal muscle tone. Coordination normal.  Skin: Skin is warm and dry. No rash noted. No erythema. No pallor.  Psychiatric: She has a normal mood and affect.          Assessment & Plan:   Problem List Items Addressed This Visit     Cardiovascular and Mediastinum   Hypertension - Primary      bp in fair control at this time  BP Readings from Last 1 Encounters:  05/08/14 126/76   No changes needed Disc lifstyle change with low sodium diet and exercise   Lab reviewed     Relevant Medications      fenofibrate micronized (ANTARA) capsule      rosuvastatin (CRESTOR) tablet      colesevelam (WELCHOL) 625 MG tablet     Digestive   B12 deficiency     Level is stable  conitnue current supplementation      Other   Hyperlipidemia     Disc goals for lipids and reasons to control them Rev labs with pt Rev low sat fat diet in detail Pt unable to tol most meds-is on the only regimen that she can tolerate Given diet handout     Relevant Medications      fenofibrate micronized (ANTARA) capsule       rosuvastatin (CRESTOR) tablet      colesevelam (WELCHOL) 625 MG tablet   Routine general medical examination at a health care facility     Reviewed health habits including diet and exercise and skin cancer prevention Reviewed appropriate screening tests for age  Also reviewed health mt list, fam hx and immunization status , as well as social and family history   See HPI Lab rev  Enc to loose wt     Obesity     Discussed how this problem influences overall health and the risks it imposes  Reviewed plan for weight loss with lower calorie diet (via better food choices and also portion control or program like weight watchers) and exercise building up to or more than 30 minutes 5 days per week including some aerobic activity    Will have to do low impact exercise with fibromyalgia     Encounter for routine gynecological examination     Exam done with pap    Relevant Orders      Cytology - PAP

## 2014-05-10 LAB — CYTOLOGY - PAP

## 2014-09-18 ENCOUNTER — Encounter: Payer: Self-pay | Admitting: Family Medicine

## 2014-11-18 ENCOUNTER — Encounter: Payer: Self-pay | Admitting: Family Medicine

## 2014-11-18 ENCOUNTER — Ambulatory Visit (INDEPENDENT_AMBULATORY_CARE_PROVIDER_SITE_OTHER): Payer: 59 | Admitting: Family Medicine

## 2014-11-18 VITALS — BP 132/78 | HR 93 | Temp 98.2°F | Ht 66.25 in | Wt 233.4 lb

## 2014-11-18 DIAGNOSIS — R1032 Left lower quadrant pain: Secondary | ICD-10-CM

## 2014-11-18 DIAGNOSIS — R1030 Lower abdominal pain, unspecified: Secondary | ICD-10-CM

## 2014-11-18 NOTE — Assessment & Plan Note (Signed)
Some tenderness on exam - bulge not evident Worse with straining -at site of old hernia surgery Ordered CT of pelvis with contrast  Enc pt not to strain inst if worse/ or if pain does not go away/she feels a bulge -to let us know and go to ER if after hours

## 2014-11-18 NOTE — Progress Notes (Signed)
Pre visit review using our clinic review tool, if applicable. No additional management support is needed unless otherwise documented below in the visit note. 

## 2014-11-18 NOTE — Progress Notes (Signed)
Subjective:    Patient ID: Beth Fisher, female    DOB: 08-19-1951, 64 y.o.   MRN: [id number]  HPI Here for concerns about a hernia   She had a hernia 30 years ago (both sides) - had surgery   (36 years ago)  Having discomfort in that area again -- left lower abdomen  She gets sharp pains in certain positions -and when she steps up with the L leg  Also straining makes it worse -for example lifting her 15 lb pound   Has not noticed a bulge but she is overwt and has a hard time telling   She does not strain to use the bathroom   Patient Active Problem List   Diagnosis Date Noted  . Encounter for routine gynecological examination 05/08/2014  . B12 deficiency 12/29/2011  . 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 Anemia 12/29/2011  . Obesity 11/09/2011  . Gynecological examination 04/27/2011  . Routine general medical examination at a health care facility 04/20/2011  . Hypertension 01/12/2011  . Hyperlipidemia 01/12/2011  . Peptic ulcer disease 01/12/2011  . Fibromyalgia 01/12/2011  . Colon polyps 01/12/2011  . Menopause syndrome 01/12/2011   Past Medical History  Diagnosis Date  . Ulcer   . Hypertension   . Hyperlipidemia   . History of UTI   . Fibromyalgia   . Inflammatory polyps of colon     08/2008 OK 08/2010 Ok  byk Dr Earlean Shawl  . Pneumonia 12/2011   Past Surgical History  Procedure Laterality Date  . Hernia repair  1978   History  Substance Use Topics  . Smoking status: Former Smoker    Quit date: 10/11/1980  . Smokeless tobacco: Not on file  . Alcohol Use: No   Family History  Problem Relation Age of Onset  . Hyperlipidemia Mother   . Alzheimer's disease Mother   . Cancer Mother     breast cancer  . Diabetes Father   . Heart disease Father   . Hyperlipidemia Father   . Mental illness Father   . Alzheimer's disease Father   . Cancer Paternal Grandmother     colon  . Diabetes Paternal Grandmother   . Heart disease Cousin     sudden  death less than 51 yrs old heart attack.   Allergies  Allergen Reactions  . Zetia [Ezetimibe] Other (See Comments)    Muscle aches!!!   Current Outpatient Prescriptions on File Prior to Visit  Medication Sig Dispense Refill  . amitriptyline (ELAVIL) 25 MG tablet take 1 tablet by mouth at bedtime 30 tablet 11  . colesevelam (WELCHOL) 625 MG tablet Take 3 tablets by mouth twice daily 180 tablet 11  . cyanocobalamin 1000 MCG tablet Take 1,000 mcg by mouth daily.    . famotidine (ACID REDUCER) 10 MG tablet Take 10 mg by mouth daily as needed. OTC as directed.    . fenofibrate micronized (ANTARA) 130 MG capsule Take 1 capsule (130 mg total) by mouth daily before breakfast. 30 capsule 11  . rosuvastatin (CRESTOR) 10 MG tablet take 1 tablet by mouth ON MONDAY AND THURSDAY OF EACH WEEK 24 tablet 11  . [DISCONTINUED] olmesartan (BENICAR) 20 MG tablet Take 1 tablet (20 mg total) by mouth daily. 30 tablet 11   No current facility-administered medications on file prior to visit.      Review of Systems Review of Systems  Constitutional: Negative for fever, appetite change, fatigue and unexpected weight change.  Eyes: Negative for pain and visual  disturbance.  Respiratory: Negative for cough and shortness of breath.   Cardiovascular: Negative for cp or palpitations    Gastrointestinal: Negative for nausea, diarrhea and constipation. neg for blood in stool Genitourinary: Negative for urgency and frequency.  Skin: Negative for pallor or rash   Neurological: Negative for weakness, light-headedness, numbness and headaches.  Hematological: Negative for adenopathy. Does not bruise/bleed easily.  Psychiatric/Behavioral: Negative for dysphoric mood. The patient is not nervous/anxious.         Objective:   Physical Exam  Constitutional: She appears well-developed and well-nourished. No distress.  obese and well appearing   HENT:  Head: Normocephalic and atraumatic.  Mouth/Throat: Oropharynx is  clear and moist.  Eyes: Conjunctivae and EOM are normal. Pupils are equal, round, and reactive to light. No scleral icterus.  Neck: Normal range of motion. Neck supple. No JVD present.  Cardiovascular: Normal rate and regular rhythm.   Pulmonary/Chest: Effort normal and breath sounds normal.  Abdominal: Soft. Bowel sounds are normal. She exhibits no distension and no mass. There is tenderness. There is no rebound and no guarding.  Tenderness in R inguinal area  No appreciable bulge More tender with valsalva    Musculoskeletal: She exhibits no edema.  Lymphadenopathy:    She has no cervical adenopathy.  Neurological: She is alert. She has normal reflexes. No cranial nerve deficit. She exhibits normal muscle tone. Coordination normal.  Skin: Skin is warm and dry. No rash noted. No erythema.  Psychiatric: She has a normal mood and affect.          Assessment & Plan:   Problem List Items Addressed This Visit      Other   Left groin pain - Primary    Some tenderness on exam - bulge not evident Worse with straining -at site of old hernia surgery Ordered CT of pelvis with contrast  Enc pt not to strain inst if worse/ or if pain does not go away/she feels a bulge -to let us know and go to ER if after hours         Relevant Orders   CT Pelvis W Contrast

## 2014-11-18 NOTE — Patient Instructions (Signed)
I want to order an imaging study to see if a hernia is evident  Stop at check out for referral for CT

## 2014-11-22 ENCOUNTER — Ambulatory Visit (INDEPENDENT_AMBULATORY_CARE_PROVIDER_SITE_OTHER)
Admission: RE | Admit: 2014-11-22 | Discharge: 2014-11-22 | Disposition: A | Discharge status: Home / Self Care | Payer: 59 | Source: Ambulatory Visit | Attending: Family Medicine | Admitting: Family Medicine

## 2014-11-22 DIAGNOSIS — R1032 Left lower quadrant pain: Secondary | ICD-10-CM

## 2014-11-22 DIAGNOSIS — R1030 Lower abdominal pain, unspecified: Secondary | ICD-10-CM

## 2014-11-22 MED ORDER — IOHEXOL 300 MG/ML  SOLN
100.0000 mL | Freq: Once | INTRAMUSCULAR | Status: AC | PRN
  Administered 2014-11-22: 100 mL via INTRAVENOUS

## 2014-11-25 ENCOUNTER — Encounter: Payer: Self-pay | Admitting: Family Medicine

## 2014-11-25 ENCOUNTER — Telehealth: Payer: Self-pay

## 2014-11-25 MED ORDER — MELOXICAM 15 MG PO TABS: 15.0000 mg | ORAL_TABLET | Freq: Every day | ORAL | Status: DC

## 2014-11-25 NOTE — Telephone Encounter (Signed)
No hernia  No kidney stones No masses Some degenerative changes in the low spine and also pelvic bone (not unsual- kind of like arthritis) phlebotiths are areas of calcification in veins - those are fairly common and do not tend to cause problems  So question if this pain could actually be coming from her back ?  Also If possible GI cause ?  Any nausea/vomiting/ stool change/blood in stool? Any vaginal discharge?  Let me know, thanks

## 2014-11-25 NOTE — Telephone Encounter (Signed)
Spoke to patient and notified her of Dr Marliss Coots comments. Patient stated she wanted to know what can she do about the pain. She does not believe it is a GI cause-no stomach pain, no nausea/vomiting or change in stool. No vaginal discharge either. She said is there something she can take to help the pain. She is trying to stay off her feet but read somewhere she should move around as well. Her main concern is the pain but worry may cause blood clot.

## 2014-11-25 NOTE — Telephone Encounter (Signed)
Notified patient of Dr Marliss Coots comments. Sent Rx to [facility] as instructed.

## 2014-11-25 NOTE — Telephone Encounter (Signed)
If she has a groin strain/ sprain- then an anti inflammatory may be our best bet  If she can take nsaids-please call in meloxicam 15 mg 1 po qd prn with food # 30 no refills  Use heat and stretch area when possible  If no further improvement please let me know

## 2014-11-25 NOTE — Telephone Encounter (Signed)
Pt got CT results from mychart; pt wants to know what the results mean; pt is still having lt lower abdominal pain;pain continues to hurt same as when seen. Pt trying to stay off feet because hurts less but pt is afraid less activity may cause blood clot; pts grandmother died from a blood clot. Pt was able to read entire results of CT and pt wants more thorough description of report and what is next step. Pt request cb.

## 2014-12-25 ENCOUNTER — Other Ambulatory Visit: Payer: Self-pay | Admitting: Family Medicine

## 2015-01-23 ENCOUNTER — Other Ambulatory Visit: Payer: Self-pay | Admitting: Family Medicine

## 2015-04-21 ENCOUNTER — Other Ambulatory Visit: Payer: Self-pay | Admitting: *Deleted

## 2015-04-21 MED ORDER — AMITRIPTYLINE HCL 25 MG PO TABS: ORAL_TABLET | ORAL | Status: DC

## 2015-04-21 NOTE — Telephone Encounter (Signed)
Fax refill request, pt has CPE scheduled on 05/14/15, last refilled on 05/08/14 #30 with 11 additional refills, please advise

## 2015-04-21 NOTE — Telephone Encounter (Signed)
Please refill until her visit  

## 2015-04-21 NOTE — Telephone Encounter (Signed)
done

## 2015-05-07 ENCOUNTER — Telehealth: Payer: Self-pay | Admitting: Family Medicine

## 2015-05-07 DIAGNOSIS — Z0000 Encounter for general adult medical examination without abnormal findings: Secondary | ICD-10-CM

## 2015-05-07 DIAGNOSIS — E538 Deficiency of other specified B group vitamins: Secondary | ICD-10-CM

## 2015-05-07 NOTE — Telephone Encounter (Signed)
-----   Message from Ellamae Sia sent at 04/30/2015 11:29 AM EDT ----- Regarding: Lab orders for Thursday, 7.28.16 Patient is scheduled for CPX labs, please order future labs, Thanks , Karna Christmas

## 2015-05-08 ENCOUNTER — Other Ambulatory Visit (INDEPENDENT_AMBULATORY_CARE_PROVIDER_SITE_OTHER): Payer: 59

## 2015-05-08 DIAGNOSIS — E538 Deficiency of other specified B group vitamins: Secondary | ICD-10-CM | POA: Diagnosis not present

## 2015-05-08 DIAGNOSIS — Z0000 Encounter for general adult medical examination without abnormal findings: Secondary | ICD-10-CM | POA: Diagnosis not present

## 2015-05-08 LAB — LIPID PANEL
Cholesterol: 220 mg/dL — ABNORMAL HIGH (ref 0–200)
HDL: 43.5 mg/dL (ref >39.00)
LDL Cholesterol: 137 mg/dL — ABNORMAL HIGH (ref 0–99)
NonHDL: 176.19
Total CHOL/HDL Ratio: 5
Triglycerides: 197 mg/dL — ABNORMAL HIGH (ref 0.0–149.0)
VLDL: 39.4 mg/dL (ref 0.0–40.0)

## 2015-05-08 LAB — COMPREHENSIVE METABOLIC PANEL
ALT: 23 U/L (ref 0–35)
AST: 21 U/L (ref 0–37)
Albumin: 4.6 g/dL (ref 3.5–5.2)
Alkaline Phosphatase: 44 U/L (ref 39–117)
BUN: 13 mg/dL (ref 6–23)
CO2: 26 mEq/L (ref 19–32)
Calcium: 10.2 mg/dL (ref 8.4–10.5)
Chloride: 107 mEq/L (ref 96–112)
Creatinine, Ser: 0.79 mg/dL (ref 0.40–1.20)
GFR: 77.75 mL/min (ref >60.00)
Glucose, Bld: 97 mg/dL (ref 70–99)
Potassium: 4 mEq/L (ref 3.5–5.1)
Sodium: 140 mEq/L (ref 135–145)
Total Bilirubin: 0.8 mg/dL (ref 0.2–1.2)
Total Protein: 7.5 g/dL (ref 6.0–8.3)

## 2015-05-08 LAB — CBC WITH DIFFERENTIAL/PLATELET
Basophils Absolute: 0 10*3/uL (ref 0.0–0.1)
Basophils Relative: 0.3 % (ref 0.0–3.0)
Eosinophils Absolute: 0.1 10*3/uL (ref 0.0–0.7)
Eosinophils Relative: 3 % (ref 0.0–5.0)
HCT: 42 % (ref 36.0–46.0)
Hemoglobin: 14.3 g/dL (ref 12.0–15.0)
Lymphocytes Relative: 44.1 % (ref 12.0–46.0)
Lymphs Abs: 2.1 10*3/uL (ref 0.7–4.0)
MCHC: 34.1 g/dL (ref 30.0–36.0)
MCV: 88.2 fl (ref 78.0–100.0)
Monocytes Absolute: 0.5 10*3/uL (ref 0.1–1.0)
Monocytes Relative: 10.4 % (ref 3.0–12.0)
Neutro Abs: 2 10*3/uL (ref 1.4–7.7)
Neutrophils Relative %: 42.2 % — ABNORMAL LOW (ref 43.0–77.0)
Platelets: 319 10*3/uL (ref 150.0–400.0)
RBC: 4.76 Mil/uL (ref 3.87–5.11)
RDW: 13.1 % (ref 11.5–15.5)
WBC: 4.7 10*3/uL (ref 4.0–10.5)

## 2015-05-08 LAB — VITAMIN B12: Vitamin B-12: 493 pg/mL (ref 211–911)

## 2015-05-08 LAB — TSH: TSH: 2.92 u[IU]/mL (ref 0.35–4.50)

## 2015-05-12 ENCOUNTER — Other Ambulatory Visit: Payer: PRIVATE HEALTH INSURANCE

## 2015-05-14 ENCOUNTER — Encounter: Payer: Self-pay | Admitting: Family Medicine

## 2015-05-14 ENCOUNTER — Ambulatory Visit (INDEPENDENT_AMBULATORY_CARE_PROVIDER_SITE_OTHER): Payer: 59 | Admitting: Family Medicine

## 2015-05-14 ENCOUNTER — Other Ambulatory Visit (HOSPITAL_COMMUNITY)
Admission: RE | Admit: 2015-05-14 | Discharge: 2015-05-14 | Disposition: A | Discharge status: Home / Self Care | Payer: 59 | Source: Ambulatory Visit | Attending: Family Medicine | Admitting: Family Medicine

## 2015-05-14 VITALS — BP 124/78 | HR 90 | Temp 98.1°F | Ht 66.0 in | Wt 228.8 lb

## 2015-05-14 DIAGNOSIS — Z1151 Encounter for screening for human papillomavirus (HPV): Secondary | ICD-10-CM | POA: Insufficient documentation

## 2015-05-14 DIAGNOSIS — I10 Essential (primary) hypertension: Secondary | ICD-10-CM

## 2015-05-14 DIAGNOSIS — Z01411 Encounter for gynecological examination (general) (routine) with abnormal findings: Secondary | ICD-10-CM | POA: Diagnosis present

## 2015-05-14 DIAGNOSIS — R87810 Cervical high risk human papillomavirus (HPV) DNA test positive: Secondary | ICD-10-CM | POA: Insufficient documentation

## 2015-05-14 DIAGNOSIS — E538 Deficiency of other specified B group vitamins: Secondary | ICD-10-CM | POA: Diagnosis not present

## 2015-05-14 DIAGNOSIS — Z0000 Encounter for general adult medical examination without abnormal findings: Secondary | ICD-10-CM | POA: Diagnosis not present

## 2015-05-14 DIAGNOSIS — Z01419 Encounter for gynecological examination (general) (routine) without abnormal findings: Secondary | ICD-10-CM

## 2015-05-14 DIAGNOSIS — E669 Obesity, unspecified: Secondary | ICD-10-CM | POA: Diagnosis not present

## 2015-05-14 DIAGNOSIS — E785 Hyperlipidemia, unspecified: Secondary | ICD-10-CM

## 2015-05-14 MED ORDER — AMITRIPTYLINE HCL 25 MG PO TABS: ORAL_TABLET | ORAL | Status: DC

## 2015-05-14 MED ORDER — ROSUVASTATIN CALCIUM 10 MG PO TABS: ORAL_TABLET | ORAL | Status: DC

## 2015-05-14 MED ORDER — COLESEVELAM HCL 625 MG PO TABS: ORAL_TABLET | ORAL | Status: DC

## 2015-05-14 MED ORDER — FENOFIBRATE MICRONIZED 130 MG PO CAPS: 130.0000 mg | ORAL_CAPSULE | Freq: Every day | ORAL | Status: DC

## 2015-05-14 NOTE — Assessment & Plan Note (Signed)
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

## 2015-05-14 NOTE — Patient Instructions (Signed)
Your labs are stable Eat a healthy balanced diet  Work on weight loss Get a flu shot in the fall  You are due for a colonoscopy in November  We did your pap today- last year's pap had HPV and I suspect it will clear

## 2015-05-14 NOTE — Progress Notes (Signed)
Pre visit review using our clinic review tool, if applicable. No additional management support is needed unless otherwise documented below in the visit note. 

## 2015-05-14 NOTE — Assessment & Plan Note (Signed)
bp in fair control at this time  BP Readings from Last 1 Encounters:  05/14/15 124/78   No changes needed Disc lifstyle change with low sodium diet and exercise  Labs reviewed

## 2015-05-14 NOTE — Assessment & Plan Note (Signed)
Reviewed health habits including diet and exercise and skin cancer prevention Reviewed appropriate screening tests for age  Also reviewed health mt list, fam hx and immunization status , as well as social and family history   Labs reviewed See HPI Pt aware her colonoscopy is due in Nov for f/u of polyps  Disc need for wt loss  Will get a flu shot in the fall  Pap today with HPV test

## 2015-05-14 NOTE — Progress Notes (Signed)
Subjective:    Patient ID: Beth Fisher, female    DOB: November 03, 1950, 64 y.o.   MRN: [id number]  HPI Here for health maintenance exam and to review chronic medical problems    Can't seem to get her energy back   Still has "mystery pain" Aleve helped - takes infrequently  Then R side started hurting her  Thinks it is muscular  Ct scan was good    Wt is down 5 lb with bmi 36 Is working on it  [employer] kale / she loves it and adds fruit to it  Walking dog for exercise  Obese  Hep C screen- not high risk /declines   Pap nl 2015- had pos HPV - ? Why Will repeat this time  No gyn visits  No hx of abnormal paps   Flu shot- had it in the fall   Mammogram 12/15 nl  Self exam- no lumps or changes   Td 7/10  colonosc 11/11 polyps - 5 year recall Dr Allyn Kenner will send a reminder   Zoster vaccine 10/12  hiv screen neg 3/13  Vit B12 def Lab Results  Component Value Date   VITAMINB12 493 05/08/2015    Cholesterol Lab Results  Component Value Date   CHOL 220* 05/08/2015   CHOL 232* 05/01/2014   CHOL 249* 04/25/2013   Lab Results  Component Value Date   HDL 43.50 05/08/2015   HDL 40.90 05/01/2014   HDL 45.00 04/25/2013   Lab Results  Component Value Date   LDLCALC 137* 05/08/2015   Lab Results  Component Value Date   TRIG 197.0* 05/08/2015   TRIG 269.0* 05/01/2014   TRIG 233.0* 04/25/2013   Lab Results  Component Value Date   CHOLHDL 5 05/08/2015   CHOLHDL 6 05/01/2014   CHOLHDL 6 04/25/2013   Lab Results  Component Value Date   LDLDIRECT 169.4 05/01/2014   LDLDIRECT 179.3 04/25/2013   LDLDIRECT 174.7 04/24/2012   Risk ratio came down a point - and her LDL is down and trig  Not eating as much dairy Low fat/no greasy foods  Eating kale also   Lab Results  Component Value Date   TSH 2.92 05/08/2015     Lab Results  Component Value Date   WBC 4.7 05/08/2015   HGB 14.3 05/08/2015   HCT 42.0 05/08/2015   MCV 88.2 05/08/2015   PLT  319.0 05/08/2015     bp is stable today  No cp or palpitations or headaches or edema  No side effects to medicines  BP Readings from Last 3 Encounters:  05/14/15 124/78  11/18/14 132/78  05/08/14 126/76      Chemistry      Component Value Date/Time   NA 140 05/08/2015 1048   K 4.0 05/08/2015 1048   CL 107 05/08/2015 1048   CO2 26 05/08/2015 1048   BUN 13 05/08/2015 1048   CREATININE 0.79 05/08/2015 1048      Component Value Date/Time   CALCIUM 10.2 05/08/2015 1048   ALKPHOS 44 05/08/2015 1048   AST 21 05/08/2015 1048   ALT 23 05/08/2015 1048   BILITOT 0.8 05/08/2015 1048        Patient Active Problem List   Diagnosis Date Noted  . Left groin pain 11/18/2014  . Encounter for routine gynecological examination 05/08/2014  . B12 deficiency 12/29/2011  . 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 Anemia 12/29/2011  . Obesity 11/09/2011  . Gynecological examination 04/27/2011  . Routine general  medical examination at a health care facility 04/20/2011  . Hypertension 01/12/2011  . Hyperlipidemia 01/12/2011  . Peptic ulcer disease 01/12/2011  . Fibromyalgia 01/12/2011  . Colon polyps 01/12/2011  . Menopause syndrome 01/12/2011   Past Medical History  Diagnosis Date  . Ulcer   . Hypertension   . Hyperlipidemia   . History of UTI   . Fibromyalgia   . Inflammatory polyps of colon     08/2008 OK 08/2010 Ok  byk Dr Earlean Shawl  . Pneumonia 12/2011   Past Surgical History  Procedure Laterality Date  . Hernia repair  1978   History  Substance Use Topics  . Smoking status: Former Smoker    Quit date: 10/11/1980  . Smokeless tobacco: Not on file  . Alcohol Use: No   Family History  Problem Relation Age of Onset  . Hyperlipidemia Mother   . Alzheimer's disease Mother   . Cancer Mother     breast cancer  . Diabetes Father   . Heart disease Father   . Hyperlipidemia Father   . Mental illness Father   . Alzheimer's disease Father   . Cancer Paternal  Grandmother     colon  . Diabetes Paternal Grandmother   . Heart disease Cousin     sudden death less than 50 yrs old heart attack.   Allergies  Allergen Reactions  . Zetia [Ezetimibe] Other (See Comments)    Muscle aches!!!   Current Outpatient Prescriptions on File Prior to Visit  Medication Sig Dispense Refill  . cyanocobalamin 1000 MCG tablet Take 1,000 mcg by mouth daily.    . famotidine (ACID REDUCER) 10 MG tablet Take 10 mg by mouth daily as needed. OTC as directed.    . [DISCONTINUED] olmesartan (BENICAR) 20 MG tablet Take 1 tablet (20 mg total) by mouth daily. 30 tablet 11   No current facility-administered medications on file prior to visit.    Review of Systems Review of Systems  Constitutional: Negative for fever, appetite change, fatigue and unexpected weight change.  Eyes: Negative for pain and visual disturbance.  Respiratory: Negative for cough and shortness of breath.   Cardiovascular: Negative for cp or palpitations    Gastrointestinal: Negative for nausea, diarrhea and constipation. pos for intermittent low abd /pelvic muscle pain (with neg CT scan) Genitourinary: Negative for urgency and frequency. pos for occ urge incontinence  Skin: Negative for pallor or rash   Neurological: Negative for weakness, light-headedness, numbness and headaches.  Hematological: Negative for adenopathy. Does not bruise/bleed easily.  Psychiatric/Behavioral: Negative for dysphoric mood. The patient is not nervous/anxious.         Objective:   Physical Exam  Constitutional: She appears well-developed and well-nourished. No distress.  obese and well appearing   HENT:  Head: Normocephalic and atraumatic.  Right Ear: External ear normal.  Left Ear: External ear normal.  Mouth/Throat: Oropharynx is clear and moist.  Eyes: Conjunctivae and EOM are normal. Pupils are equal, round, and reactive to light. No scleral icterus.  Neck: Normal range of motion. Neck supple. No JVD present.  Carotid bruit is not present. No thyromegaly present.  Cardiovascular: Normal rate, regular rhythm, normal heart sounds and intact distal pulses.  Exam reveals no gallop.   Pulmonary/Chest: Effort normal and breath sounds normal. No respiratory distress. She has no wheezes. She exhibits no tenderness.  Abdominal: Soft. Bowel sounds are normal. She exhibits no distension, no abdominal bruit and no mass. There is no tenderness.  Genitourinary: Vagina normal and uterus  normal. No breast swelling, tenderness, discharge or bleeding. There is no rash, tenderness or lesion on the right labia. There is no rash, tenderness or lesion on the left labia. Uterus is not enlarged and not tender. Cervix exhibits no motion tenderness, no discharge and no friability. Right adnexum displays no mass, no tenderness and no fullness. Left adnexum displays no mass, no tenderness and no fullness. No bleeding in the vagina. No vaginal discharge found.  Breast exam: No mass, nodules, thickening, tenderness, bulging, retraction, inflamation, nipple discharge or skin changes noted.  No axillary or clavicular LA.      No tenderness on bimanual pelvic exam  Musculoskeletal: Normal range of motion. She exhibits no edema or tenderness.  Lymphadenopathy:    She has no cervical adenopathy.  Neurological: She is alert. She has normal reflexes. No cranial nerve deficit. She exhibits normal muscle tone. Coordination normal.  Skin: Skin is warm and dry. No rash noted. No erythema. No pallor.  Psychiatric: She has a normal mood and affect.          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 -no symptoms and not sexually active Repeat pap  Suspect it will be clear Explained condition and  handout given       Relevant Orders   Cytology - PAP   Hyperlipidemia    Pt takes fibrate and welchol and low dose intermittent crestor  Disc goals for lipids and reasons to control them Rev labs with pt Rev low sat fat diet in detail Some imp in LDL and trig this time - commended on better diet and exercise May be candidate for PCSK9 inhib med in the future if it becomes affordable        Relevant Medications   rosuvastatin (CRESTOR) 10 MG tablet   fenofibrate micronized (ANTARA) 130 MG capsule   colesevelam (WELCHOL) 625 MG tablet   Hypertension    bp in fair control at this time  BP Readings from Last 1 Encounters:  05/14/15 124/78   No changes needed Disc lifstyle change with low sodium diet and exercise  Labs reviewed       Relevant Medications   rosuvastatin (CRESTOR) 10 MG tablet   fenofibrate micronized (ANTARA) 130 MG capsule   colesevelam (WELCHOL) 625 MG tablet   Obesity    Discussed how this problem influences overall health and the risks it imposes  Reviewed plan for weight loss with lower calorie diet (via better food choices and also portion control or program like weight watchers) and exercise building up to or more than 30 minutes 5 days per week including some aerobic activity         Routine general medical examination at a health care facility - Primary    Reviewed health habits including diet and exercise and skin cancer prevention Reviewed appropriate screening tests for age  Also reviewed health mt list, fam hx and immunization status , as well as social and family history   Labs reviewed See HPI Pt aware her colonoscopy is due in Nov for f/u of polyps  Disc need for wt loss  Will get a flu shot in the fall  Pap today with HPV test

## 2015-05-14 NOTE — Assessment & Plan Note (Signed)
Lab Results  Component Value Date   [id number] 182 05/08/2015   Continue current supplementation -this is stable

## 2015-05-14 NOTE — Assessment & Plan Note (Signed)
Pt takes fibrate and welchol and low dose intermittent crestor  Disc goals for lipids and reasons to control them Rev labs with pt Rev low sat fat diet in detail Some imp in LDL and trig this time - commended on better diet and exercise May be candidate for PCSK9 inhib med in the future if it becomes affordable

## 2015-05-14 NOTE — Assessment & Plan Note (Signed)
Discussed how this problem influences overall health and the risks it imposes  Reviewed plan for weight loss with lower calorie diet (via better food choices and also portion control or program like weight watchers) and exercise building up to or more than 30 minutes 5 days per week including some aerobic activity    

## 2015-05-19 ENCOUNTER — Telehealth: Payer: Self-pay | Admitting: Family Medicine

## 2015-05-19 DIAGNOSIS — IMO0002 Reserved for concepts with insufficient information to code with codable children: Secondary | ICD-10-CM | POA: Insufficient documentation

## 2015-05-19 NOTE — Telephone Encounter (Signed)
-----   Message from Tammi Sou, [place] sent at 05/19/2015 12:44 PM EDT ----- Pt notified of pap results and Dr. Marliss Coots recommendations pt agrees with referral to GYN she would like to see someone in [place], and would like a female doctor that isn't to young (per pt request), I advise pt one of our [facility] will call to schedule appt, please put referral in

## 2015-05-19 NOTE — Telephone Encounter (Signed)
Done -will route to [place] 

## 2015-05-21 ENCOUNTER — Telehealth: Payer: Self-pay | Admitting: Family Medicine

## 2015-05-21 LAB — CYTOLOGY - PAP

## 2015-05-21 NOTE — Telephone Encounter (Signed)
I like Dr Hulan Fray but everyone is good there  If she would rather go to [place] like [facility] = all the female providers are also good there

## 2015-05-21 NOTE — Telephone Encounter (Signed)
Daughter Beth Fisher called to speak to supervisor.  Pt had called her very upset about information she had received re: her referral to Dr. Hulan Fray.  Pt was both upset that she had not been able to speak with Dr. Glori Bickers about her lab results personally and also that she was having to wait to get appointment with the [facility].  (Dr. Hulan Fray).  Called [facility] got first available appointment 06/09/15 with Dr. Hulan Fray, called pt and gave her information and explained they would call if they have cancellation and can see her sooner.  Pt was appreciative as was her daughter for getting her mother scheduled.

## 2015-05-21 NOTE — Telephone Encounter (Signed)
Spoke to Beth Fisher and advised her of Dr. Marliss Coots comments. She would like to see Dr. Hulan Fray. I have put referral on [facility] workqueue. Beth Fisher is aware they will call to set up appt with Dr. Hulan Fray.

## 2015-05-21 NOTE — Telephone Encounter (Signed)
I spoke to the patient - she was upset because "the girl she talked to"   (does not know who that is) told her she was a week behind in referrals and to go online and find her own doctor -the patient and her daughter thought that was unprofessional  She now has appt with Dr Hulan Fray and is happy

## 2015-05-21 NOTE — Telephone Encounter (Signed)
Spoke to pt. Gave her several offices to go to. I reccommended [facility] for [facility] and [facility] [place]. Pt would like your recommendation on a specific doctor she should be seeing. I could not advise her on one specific doctor that is better than the other. Please advise

## 2015-06-09 ENCOUNTER — Ambulatory Visit (INDEPENDENT_AMBULATORY_CARE_PROVIDER_SITE_OTHER): Payer: 59 | Admitting: Obstetrics & Gynecology

## 2015-06-09 ENCOUNTER — Encounter: Payer: Self-pay | Admitting: Obstetrics & Gynecology

## 2015-06-09 VITALS — BP 142/98 | HR 84 | Ht 66.0 in | Wt 221.0 lb

## 2015-06-09 DIAGNOSIS — R87810 Cervical high risk human papillomavirus (HPV) DNA test positive: Secondary | ICD-10-CM

## 2015-06-09 DIAGNOSIS — B977 Papillomavirus as the cause of diseases classified elsewhere: Secondary | ICD-10-CM

## 2015-06-09 NOTE — Progress Notes (Signed)
   Subjective:    Patient ID: Beth Fisher, female    DOB: 08-29-1951, 64 y.o.   MRN: [id number]  HPI  This 64 yo MW lady is here with the concern about her pap smear which for the last 2 years has been Negative but + for HR HPV. She had coitarche with her husband at 64 years of age and has been monogamous. He was sexually active prior to marriage. She is very nervous about her risk of getting cervical cancer as well as worried about infidelity.  Review of Systems     Objective:   Physical Exam [id number] Breathing, conversing, and ambulating normally       Assessment & Plan:  We discussed the rec to stop pap at 64 yo with no risk factors but with her +HR HPV status, I have recommended doing another one in 2 years. I have explained that I cannot make a statement about her husbuand's fidelity.

## 2015-08-28 LAB — HM COLONOSCOPY

## 2015-10-30 ENCOUNTER — Encounter: Payer: Self-pay | Admitting: Family Medicine

## 2015-10-31 ENCOUNTER — Telehealth: Payer: Self-pay | Admitting: Family Medicine

## 2015-10-31 MED ORDER — COLESTIPOL HCL 5 G PO PACK: 5.0000 g | PACK | Freq: Every day | ORAL | Status: DC

## 2015-10-31 NOTE — Telephone Encounter (Signed)
Ins changed cov of chol med - no longer welchol - covers colestid

## 2015-12-01 ENCOUNTER — Encounter: Payer: Self-pay | Admitting: Family Medicine

## 2015-12-03 ENCOUNTER — Encounter: Payer: Self-pay | Admitting: Family Medicine

## 2015-12-03 ENCOUNTER — Telehealth: Payer: Self-pay | Admitting: Family Medicine

## 2015-12-03 MED ORDER — COLESTIPOL HCL 1 G PO TABS: 2.0000 g | ORAL_TABLET | Freq: Every day | ORAL | Status: DC

## 2015-12-03 NOTE — Telephone Encounter (Signed)
Changing dose of colestipol to lower dose in pill

## 2016-05-10 ENCOUNTER — Other Ambulatory Visit: Payer: 59

## 2016-05-14 ENCOUNTER — Encounter: Payer: 59 | Admitting: Family Medicine

## 2016-05-17 ENCOUNTER — Other Ambulatory Visit: Payer: 59

## 2016-05-21 ENCOUNTER — Encounter: Payer: 59 | Admitting: Family Medicine

## 2016-06-10 DIAGNOSIS — R42 Dizziness and giddiness: Secondary | ICD-10-CM | POA: Insufficient documentation

## 2016-06-10 DIAGNOSIS — R519 Headache, unspecified: Secondary | ICD-10-CM | POA: Insufficient documentation

## 2016-07-15 DIAGNOSIS — R0609 Other forms of dyspnea: Secondary | ICD-10-CM | POA: Insufficient documentation

## 2016-07-15 DIAGNOSIS — I6523 Occlusion and stenosis of bilateral carotid arteries: Secondary | ICD-10-CM | POA: Insufficient documentation

## 2016-08-03 IMAGING — CT CT PELVIS W/ CM
2 of 3 series · 17 of 46 positions shown, 19 images · IV contrast (omnipaque)
Comparison: None.

CLINICAL DATA: Left groin pain for one month at site of old hernia
repair 30 years ago. Increasing pain during straining. No recent
injury. Initial encounter.

EXAM:
CT PELVIS WITH CONTRAST
TECHNIQUE: Multidetector CT imaging of the pelvis was performed using the
standard protocol following the bolus administration of intravenous
contrast.
CONTRAST:  100mL OMNIPAQUE IOHEXOL 300 MG/ML  SOLN

[Series 2: pel 5.0 b30f st · axial · 0.80mm/px · z∈[-379,-179]mm · 14 of 48 slices shown, 16 images]
[im 4/48  soft-tissue]
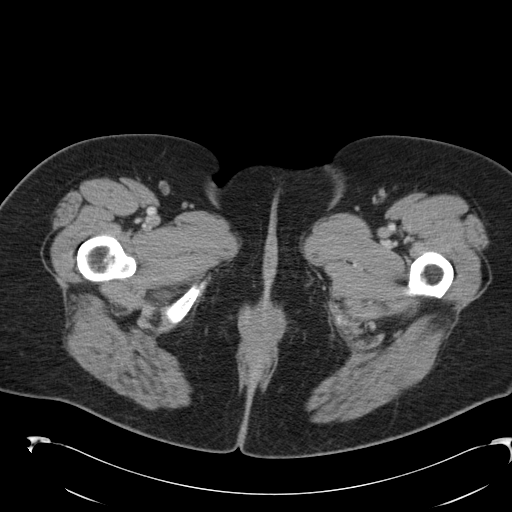
[im 4/48  bone]
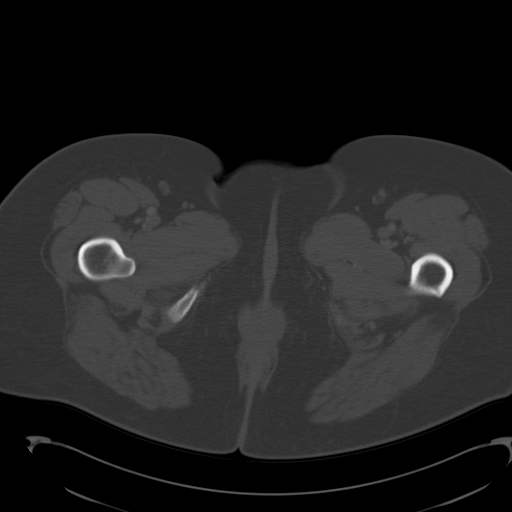
[im 7/48  soft-tissue]
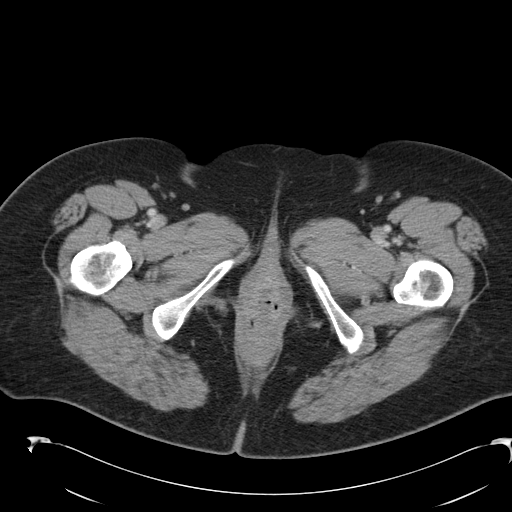
[im 10/48  soft-tissue]
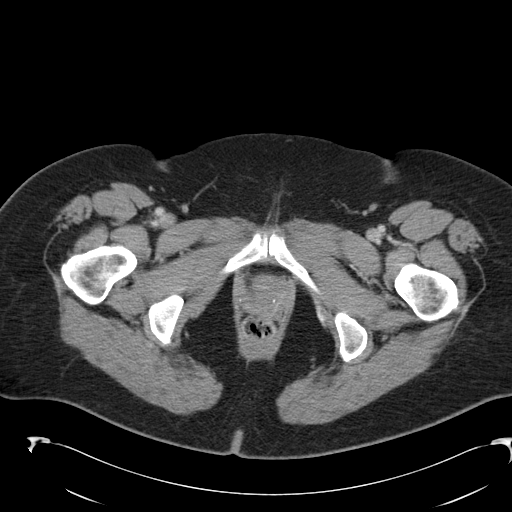
[im 13/48  soft-tissue]
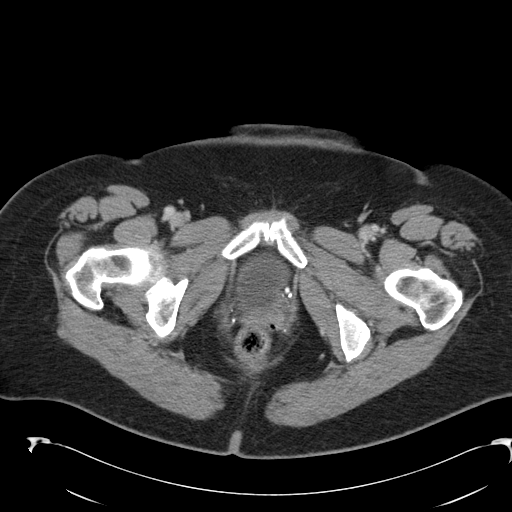
[im 16/48  soft-tissue]
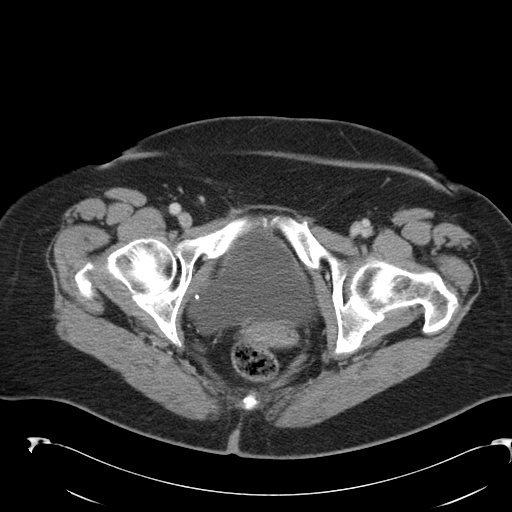
[im 19/48  soft-tissue]
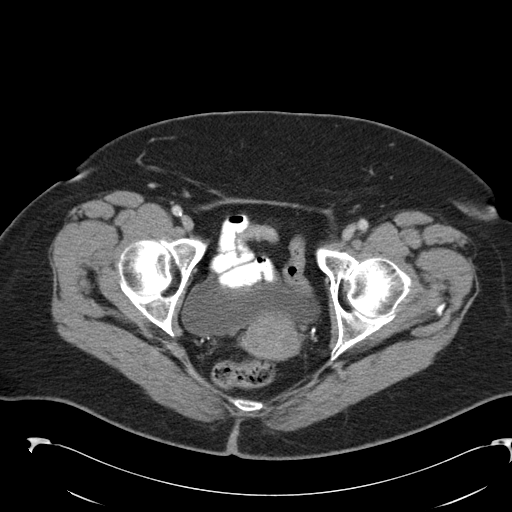
[im 22/48  soft-tissue]
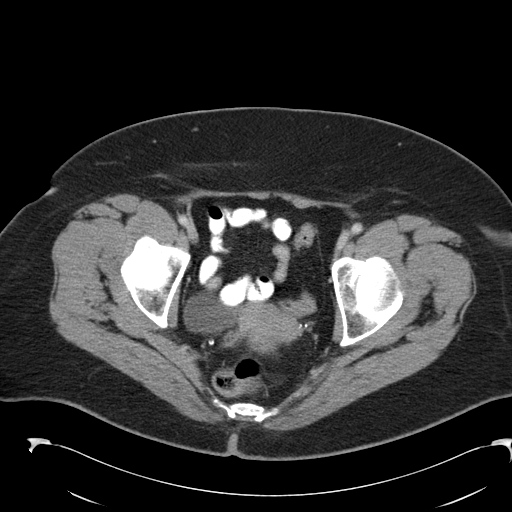
[im 26/48  soft-tissue]
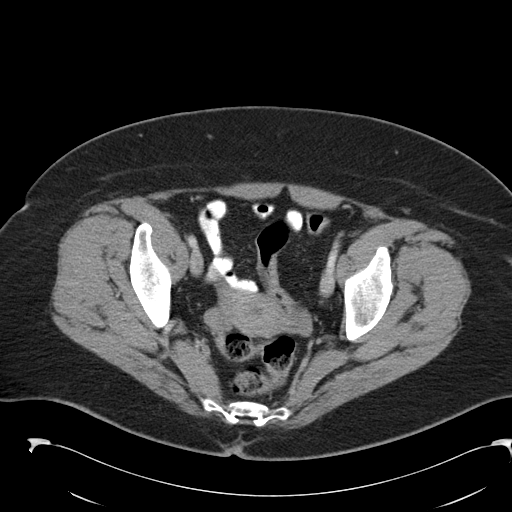
[im 29/48  soft-tissue]
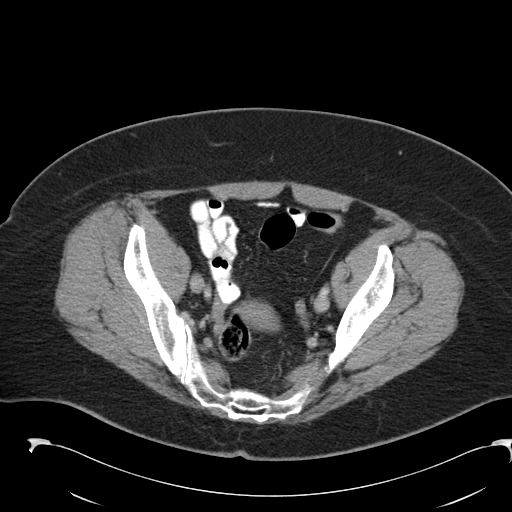
[im 29/48  bone]
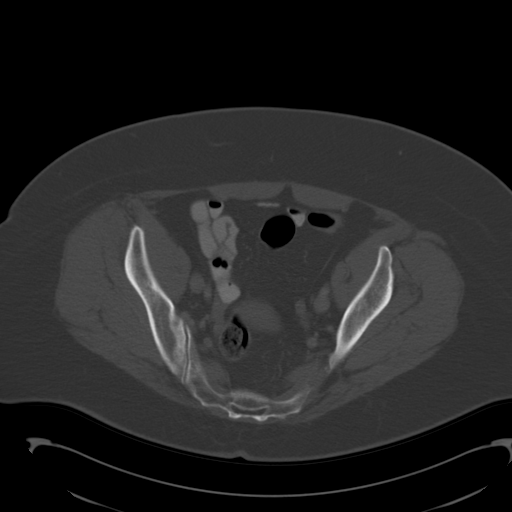
[im 32/48  soft-tissue]
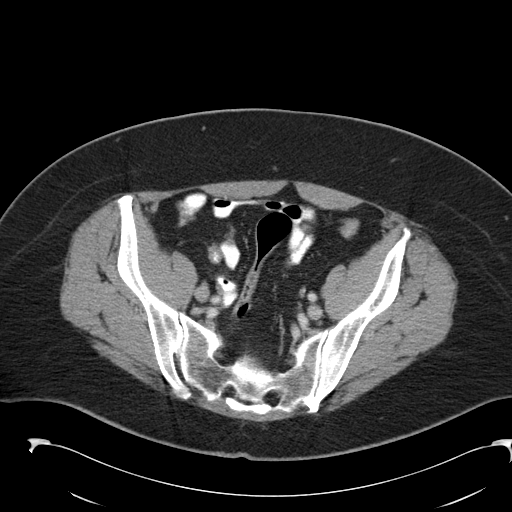
[im 35/48  soft-tissue]
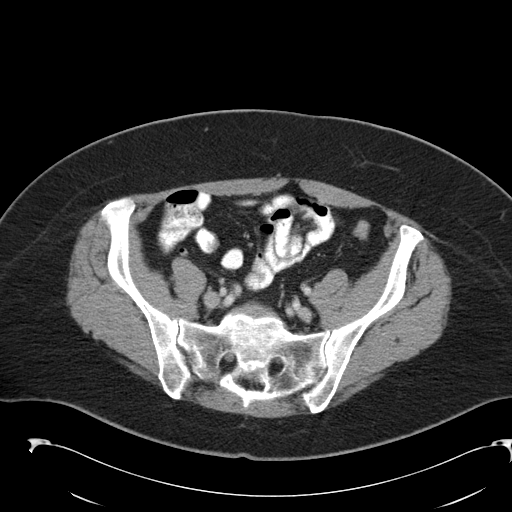
[im 38/48  soft-tissue]
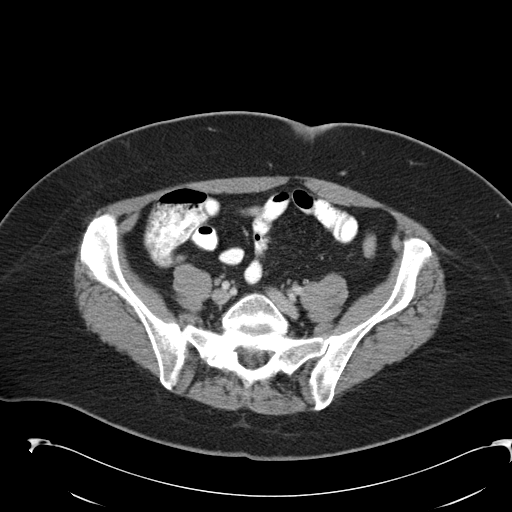
[im 41/48  soft-tissue]
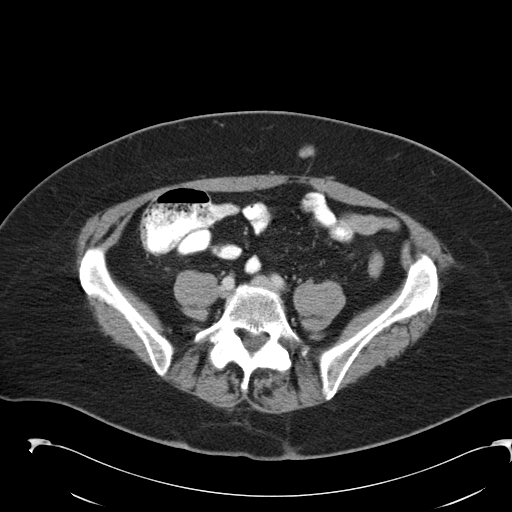
[im 44/48  soft-tissue]
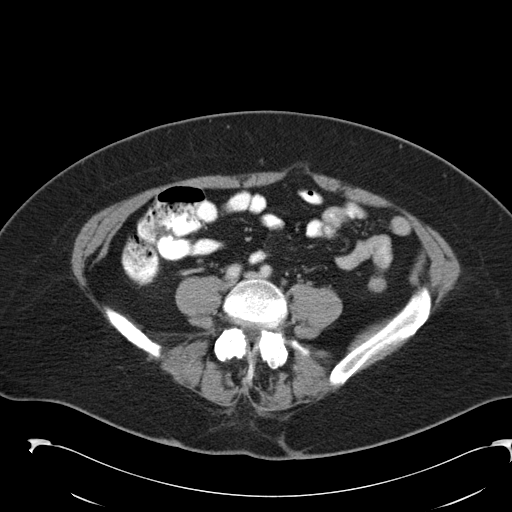

[Series 602: cor · coronal · 0.80mm/px · 3 of 131 slices shown]
[im 44/131  soft-tissue]
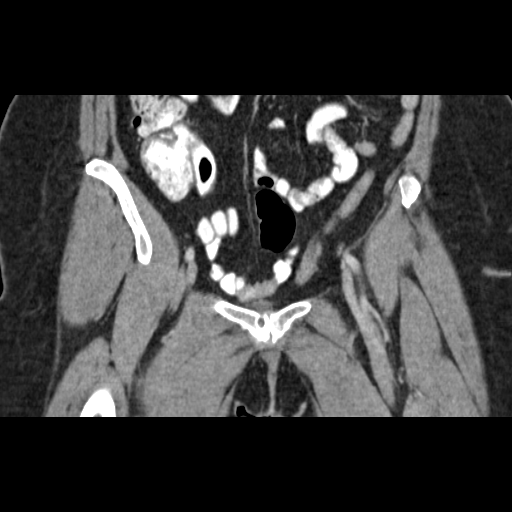
[im 58/131  soft-tissue]
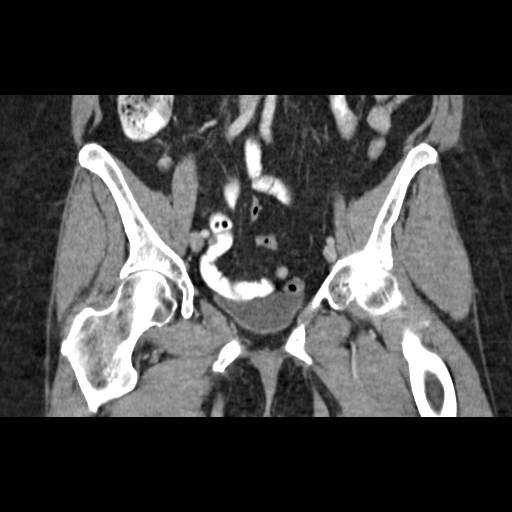
[im 73/131  soft-tissue]
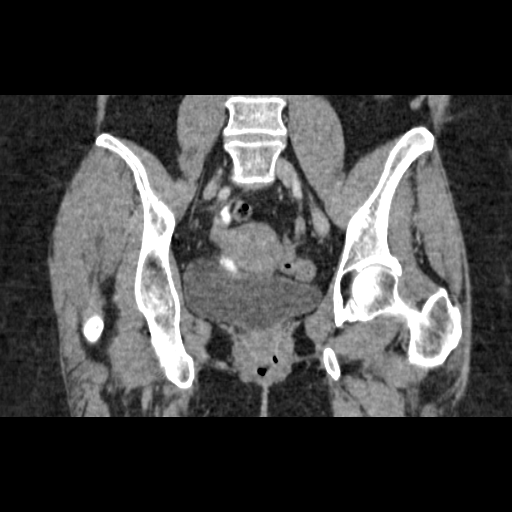

[17 of 46 positions shown; findings below may reference images not displayed]

FINDINGS: Urinary Tract: The distal ureters and bladder appear unremarkable.
There is no evidence of urinary tract calculus. Multiple pelvic
phleboliths are present. There is a phlebolith within the left
gonadal vein on image 12.

Bowel: No evidence of bowel wall thickening, distention or
surrounding inflammatory change in the pelvis.The appendix appears
normal.

Vascular/Lymphatic: No evidence of pelvic adenopathy. The iliac
vessels appear normal.

Reproductive: The uterus and ovaries appear normal. No evidence of
adnexal mass. Some pelvic floor laxity suspected.

Other: No evidence of pelvic wall mass or recurrent hernia. The left
inguinal region appears normal.

Musculoskeletal: No acute or significant osseous findings. Mild
lower lumbar spine facet disease and osteitis pubis noted.
IMPRESSION: *No acute findings or explanation for the patient's symptoms.
*No evidence of recurrent inguinal hernia.

## 2017-06-18 DIAGNOSIS — K649 Unspecified hemorrhoids: Secondary | ICD-10-CM | POA: Insufficient documentation

## 2017-09-22 DIAGNOSIS — Z780 Asymptomatic menopausal state: Secondary | ICD-10-CM | POA: Insufficient documentation

## 2018-03-25 DIAGNOSIS — K219 Gastro-esophageal reflux disease without esophagitis: Secondary | ICD-10-CM | POA: Insufficient documentation

## 2018-09-29 LAB — HM MAMMOGRAPHY

## 2018-11-29 ENCOUNTER — Encounter: Payer: Self-pay | Admitting: Internal Medicine

## 2018-11-29 ENCOUNTER — Ambulatory Visit: Payer: 59 | Admitting: Internal Medicine

## 2018-11-29 VITALS — BP 138/90 | HR 95 | Temp 98.3°F | Ht 66.0 in | Wt 221.4 lb

## 2018-11-29 DIAGNOSIS — R32 Unspecified urinary incontinence: Secondary | ICD-10-CM

## 2018-11-29 DIAGNOSIS — Z13818 Encounter for screening for other digestive system disorders: Secondary | ICD-10-CM

## 2018-11-29 DIAGNOSIS — I10 Essential (primary) hypertension: Secondary | ICD-10-CM | POA: Diagnosis not present

## 2018-11-29 DIAGNOSIS — Z1329 Encounter for screening for other suspected endocrine disorder: Secondary | ICD-10-CM

## 2018-11-29 DIAGNOSIS — Z1389 Encounter for screening for other disorder: Secondary | ICD-10-CM

## 2018-11-29 DIAGNOSIS — E785 Hyperlipidemia, unspecified: Secondary | ICD-10-CM

## 2018-11-29 DIAGNOSIS — E559 Vitamin D deficiency, unspecified: Secondary | ICD-10-CM | POA: Insufficient documentation

## 2018-11-29 DIAGNOSIS — E538 Deficiency of other specified B group vitamins: Secondary | ICD-10-CM

## 2018-11-29 DIAGNOSIS — N3281 Overactive bladder: Secondary | ICD-10-CM | POA: Diagnosis not present

## 2018-11-29 DIAGNOSIS — R7303 Prediabetes: Secondary | ICD-10-CM | POA: Insufficient documentation

## 2018-11-29 DIAGNOSIS — M8580 Other specified disorders of bone density and structure, unspecified site: Secondary | ICD-10-CM

## 2018-11-29 MED ORDER — OXYBUTYNIN CHLORIDE 5 MG PO TABS: 2.5000 mg | ORAL_TABLET | Freq: Every evening | ORAL | 0 refills | Status: DC | PRN

## 2018-11-29 MED ORDER — COLESEVELAM HCL 625 MG PO TABS: 1250.0000 mg | ORAL_TABLET | Freq: Every day | ORAL | Status: DC

## 2018-11-29 MED ORDER — FUROSEMIDE 20 MG PO TABS: 20.0000 mg | ORAL_TABLET | Freq: Every day | ORAL | Status: DC | PRN

## 2018-11-29 NOTE — Progress Notes (Signed)
Chief Complaint  Patient presents with  . Establish Care   New patient  1. HTN not taking benicar 20 mg daily not consistent with it 2. HLD on Welchol 1250 mg qd, Colestid 2 mg qd, Anatara 130 mg qd, Crestor 5 mg qhs could not tolerate zetia, lipitor or zocor in the past  3. Reviewed labs with PCP 09/18/18 CMET normal, CBC, A1C 5.7, vitamin D 18.5, TC 228, TG 202, LDL 146, HDL 41.4.  4. Overactive bladder UA 09/18/18 normal vs incontinence having to wear a liner/pad at night   Review of Systems  Constitutional: Negative for weight loss.  HENT: Negative for hearing loss.   Eyes: Negative for blurred vision.  Respiratory: Negative for shortness of breath.   Cardiovascular: Negative for chest pain.  Gastrointestinal: Negative for abdominal pain.  Genitourinary:       +incontinence   Musculoskeletal: Negative for falls.  Skin: Negative for rash.  Neurological: Negative for headaches.  Psychiatric/Behavioral: Negative for depression.   Past Medical History:  Diagnosis Date  . Arthritis   . Carotid stenosis    b/l w/o sx's   . Fibromyalgia   . Gastric ulcer    1970s  . GERD (gastroesophageal reflux disease)   . History of colon polyps   . History of UTI   . Hyperlipidemia   . Hypertension   . Inflammatory polyps of colon ([place])    08/2008 OK 08/2010 Ok  byk Dr Earlean Shawl  . Pneumonia 12/2011  . Prediabetes   . Ulcer   . UTI (urinary tract infection)    Past Surgical History:  Procedure Laterality Date  . HERNIA REPAIR  1978   Family History  Problem Relation Age of Onset  . Hyperlipidemia Mother   . Alzheimer's disease Mother   . Cancer Mother        breast cancer dx'ed 4 y.o   . Depression Mother   . Miscarriages / [place] Mother   . Diabetes Father   . Heart disease Father        CABG x 7   . Hyperlipidemia Father   . Mental illness Father   . Alzheimer's disease Father   . Depression Father   . Early death Father   . Cancer Paternal Grandmother        colon   . Diabetes Paternal Grandmother   . Heart disease Cousin        sudden death less than 1 yrs old heart attack.  . Deep vein thrombosis Maternal Grandmother   . Cancer Maternal Grandfather        throat smoker   Social History   Socioeconomic History  . Marital status: Married    Spouse name: Not on file  . Number of children: Not on file  . Years of education: Not on file  . Highest education level: Not on file  Occupational History  . Not on file  Social Needs  . Financial resource strain: Not on file  . Food insecurity:    Worry: Not on file    Inability: Not on file  . Transportation needs:    Medical: Not on file    Non-medical: Not on file  Tobacco Use  . Smoking status: Former Smoker    Last attempt to quit: 10/11/1980    Years since quitting: 38.1  . Smokeless tobacco: Never Used  Substance and Sexual Activity  . Alcohol use: No    Alcohol/week: 0.0 standard drinks  . Drug use: No  .  Sexual activity: Yes  Lifestyle  . Physical activity:    Days per week: Not on file    Minutes per session: Not on file  . Stress: Not on file  Relationships  . Social connections:    Talks on phone: Not on file    Gets together: Not on file    Attends religious service: Not on file    Active member of club or organization: Not on file    Attends meetings of clubs or organizations: Not on file    Relationship status: Not on file  . Intimate partner violence:    Fear of current or ex partner: Not on file    Emotionally abused: Not on file    Physically abused: Not on file    Forced sexual activity: Not on file  Other Topics Concern  . Not on file  Social History Narrative   No guns, wears seat belt    Safe in relationship   Married    2 kids (boy and girl) and 3 grands   Retired Armed forces operational officer, some college    Current Meds  Medication Sig  . amitriptyline (ELAVIL) 25 MG tablet take 1 tablet by mouth at bedtime  . colestipol (COLESTID) 1 g tablet Take 2 tablets (2 g  total) by mouth daily.  . cyanocobalamin 1000 MCG tablet Take 1,000 mcg by mouth daily.  . famotidine (ACID REDUCER) 10 MG tablet Take 10 mg by mouth daily as needed. OTC as directed.  . fenofibrate micronized (ANTARA) 130 MG capsule Take 1 capsule (130 mg total) by mouth daily before breakfast.  . furosemide (LASIX) 20 MG tablet Take 1 tablet (20 mg total) by mouth daily as needed.  Marland Kitchen olmesartan (BENICAR) 20 MG tablet Take 20 mg by mouth daily.  . rosuvastatin (CRESTOR) 5 MG tablet Take by mouth.  . Vitamin D, Ergocalciferol, (DRISDOL) 1.25 MG (50000 UT) CAPS capsule Take by mouth.  . [DISCONTINUED] furosemide (LASIX) 20 MG tablet Take by mouth.  . [DISCONTINUED] rosuvastatin (CRESTOR) 10 MG tablet take 1 tablet by mouth ON MONDAY AND THURSDAY OF EACH WEEK   Allergies  Allergen Reactions  . Atorvastatin Other (See Comments)  . Simvastatin Other (See Comments)  . Zetia [Ezetimibe] Other (See Comments)    Muscle aches!!!   No results found for this or any previous visit (from the past 2160 hour(s)). Objective  Body mass index is 35.73 kg/m. Wt Readings from Last 3 Encounters:  11/29/18 221 lb 6.4 oz (100.4 kg)  06/09/15 221 lb (100.2 kg)  05/14/15 228 lb 12 oz (103.8 kg)   Temp Readings from Last 3 Encounters:  11/29/18 98.3 F (36.8 C) (Oral)  05/14/15 98.1 F (36.7 C) (Oral)  11/18/14 98.2 F (36.8 C) (Oral)   BP Readings from Last 3 Encounters:  11/29/18 138/90  06/09/15 (!) 142/98  05/14/15 124/78   Pulse Readings from Last 3 Encounters:  11/29/18 95  06/09/15 84  05/14/15 90    Physical Exam Vitals signs and nursing note reviewed.  Constitutional:      Appearance: Normal appearance. She is well-developed and well-groomed. She is obese.  HENT:     Head: Normocephalic and atraumatic.     Nose: Nose normal.     Mouth/Throat:     Mouth: Mucous membranes are moist.     Pharynx: Oropharynx is clear.  Eyes:     Conjunctiva/sclera: Conjunctivae normal.      Pupils: Pupils are equal, round, and reactive to light.  Cardiovascular:     Rate and Rhythm: Normal rate and regular rhythm.     Heart sounds: Normal heart sounds. No murmur.  Pulmonary:     Effort: Pulmonary effort is normal.     Breath sounds: Normal breath sounds.  Skin:    General: Skin is warm and dry.  Neurological:     General: No focal deficit present.     Mental Status: She is alert and oriented to person, place, and time. Mental status is at baseline.     Gait: Gait normal.  Psychiatric:        Attention and Perception: Attention and perception normal.        Mood and Affect: Mood and affect normal.        Speech: Speech normal.        Behavior: Behavior normal. Behavior is cooperative.        Thought Content: Thought content normal.        Cognition and Memory: Cognition and memory normal.        Judgment: Judgment normal.     Assessment   1. HTN uncontrolled not taking medication qd but will start  2. HLD  3. HM 4. Overactive bladder UA 09/18/18 normal vs incontinence  Plan   1. rec take benicar 20 mg qd monitor BP 2.  Welchol 1250 mg qd, Colestid 2 mg qd, Anatara 130 mg qd, Crestor 5 mg qhs could not tolerate zetia, lipitor or zocor in the past  Will discuss with Dr. Clayborn Bigness about Repatha  3.  Flu shot, prevnar, shingrix utd  pna 23due in future if not had  rec Tdap  sch fasting labs 03/2019 f/u 1 week after  Pap negative 05/10/16 neg HPV  Mammogram [facility] get copy release signed today Colonoscopy 09/25/18 h/o polyps per pt get copy from Dr. Earlean Shawl in [place] 09/21/17 osteopenia rec vitamin D3 50K IU weekly then 5000 IU daily  Dermatology 03/2019 Dr. Kellie Moor  4. Trial of oxybutynin 2.5-5 mg qhs  kegels   Cards Dr. Clayborn Bigness Eye Dr. Ellin Mayhew had 11/2018  Dentist Dr. Thayer Headings   Provider: Dr. Olivia Mackie McLean-Scocuzza-[facility]

## 2018-11-29 NOTE — Patient Instructions (Addendum)
Vitamin D3 50K weekly 1x x 6 months then month 7 5000 IU daily vitamin D3    Please get Tdap vaccine at pharmacy     Tdap Vaccine (Tetanus, Diphtheria and Pertussis): What You Need to Know 1. Why get vaccinated? Tetanus, diphtheria and pertussis are very serious diseases. Tdap vaccine can protect [place] from these diseases. And, Tdap vaccine given to pregnant women can protect newborn babies against pertussis.Marland Kitchen TETANUS (Lockjaw) is rare in the [place] today. It causes painful muscle tightening and stiffness, usually all over the body.  It can lead to tightening of muscles in the head and neck so you can't open your mouth, swallow, or sometimes even breathe. Tetanus kills about 1 out of 10 people who are infected even after receiving the best medical care. DIPHTHERIA is also rare in the [place] today. It can cause a thick coating to form in the back of the throat.  It can lead to breathing problems, heart failure, paralysis, and death. PERTUSSIS (Whooping Cough) causes severe coughing spells, which can cause difficulty breathing, vomiting and disturbed sleep.  It can also lead to weight loss, incontinence, and rib fractures. Up to 2 in 100 adolescents and 5 in 100 adults with pertussis are hospitalized or have complications, which could include pneumonia or death. These diseases are caused by bacteria. Diphtheria and pertussis are spread from person to person through secretions from coughing or sneezing. Tetanus enters the body through cuts, scratches, or wounds. Before vaccines, as many as 200,000 cases of diphtheria, 200,000 cases of pertussis, and hundreds of cases of tetanus, were reported in the [place] each year. Since vaccination began, reports of cases for tetanus and diphtheria have dropped by about 99% and for pertussis by about 80%. 2. Tdap vaccine Tdap vaccine can protect adolescents and adults from tetanus, diphtheria, and pertussis. One dose of Tdap is routinely given  at age 17 or 66. People who did not get Tdap at that age should get it as soon as possible. Tdap is especially important for healthcare professionals and anyone having close contact with a baby younger than 12 months. Pregnant women should get a dose of Tdap during every pregnancy, to protect the newborn from pertussis. Infants are most at risk for severe, life-threatening complications from pertussis. Another vaccine, called Td, protects against tetanus and diphtheria, but not pertussis. A Td booster should be given every 10 years. Tdap may be given as one of these boosters if you have never gotten Tdap before. Tdap may also be given after a severe cut or burn to prevent tetanus infection. Your doctor or the person giving you the vaccine can give you more information. Tdap may safely be given at the same time as other vaccines. 3. Some people should not get this vaccine  A person who has ever had a life-threatening allergic reaction after a previous dose of any diphtheria, tetanus or pertussis containing vaccine, OR has a severe allergy to any part of this vaccine, should not get Tdap vaccine. Tell the person giving the vaccine about any severe allergies.  Anyone who had coma or long repeated seizures within 7 days after a childhood dose of DTP or DTaP, or a previous dose of Tdap, should not get Tdap, unless a cause other than the vaccine was found. They can still get Td.  Talk to your doctor if you: ? have seizures or another nervous system problem, ? had severe pain or swelling after any vaccine containing diphtheria, tetanus or pertussis, ?  ever had a condition called Guillain-Barr Syndrome (GBS), ? aren't feeling well on the day the shot is scheduled. 4. Risks With any medicine, including vaccines, there is a chance of side effects. These are usually mild and go away on their own. Serious reactions are also possible but are rare. Most people who get Tdap vaccine do not have any problems with  it. Mild problems following Tdap (Did not interfere with activities)  Pain where the shot was given (about 3 in 4 adolescents or 2 in 3 adults)  Redness or swelling where the shot was given (about 1 person in 5)  Mild fever of at least 100.91F (up to about 1 in 25 adolescents or 1 in 100 adults)  Headache (about 3 or 4 people in 10)  Tiredness (about 1 person in 3 or 4)  Nausea, vomiting, diarrhea, stomach ache (up to 1 in 4 adolescents or 1 in 10 adults)  Chills, sore joints (about 1 person in 10)  Body aches (about 1 person in 3 or 4)  Rash, swollen glands (uncommon) Moderate problems following Tdap (Interfered with activities, but did not require medical attention)  Pain where the shot was given (up to 1 in 5 or 6)  Redness or swelling where the shot was given (up to about 1 in 16 adolescents or 1 in 12 adults)  Fever over 102F (about 1 in 100 adolescents or 1 in 250 adults)  Headache (about 1 in 7 adolescents or 1 in 10 adults)  Nausea, vomiting, diarrhea, stomach ache (up to 1 or 3 people in 100)  Swelling of the entire arm where the shot was given (up to about 1 in 500). Severe problems following Tdap (Unable to perform usual activities; required medical attention)  Swelling, severe pain, bleeding and redness in the arm where the shot was given (rare). Problems that could happen after any vaccine:  People sometimes faint after a medical procedure, including vaccination. Sitting or lying down for about 15 minutes can help prevent fainting, and injuries caused by a fall. Tell your doctor if you feel dizzy, or have vision changes or ringing in the ears.  Some people get severe pain in the shoulder and have difficulty moving the arm where a shot was given. This happens very rarely.  Any medication can cause a severe allergic reaction. Such reactions from a vaccine are very rare, estimated at fewer than 1 in a million doses, and would happen within a few minutes to a  few hours after the vaccination. As with any medicine, there is a very remote chance of a vaccine causing a serious injury or death. The safety of vaccines is always being monitored. For more information, visit: http://www.aguilar.org/ 5. What if there is a serious problem? What should I look for?  Look for anything that concerns you, such as signs of a severe allergic reaction, very high fever, or unusual behavior. Signs of a severe allergic reaction can include hives, swelling of the face and throat, difficulty breathing, a fast heartbeat, dizziness, and weakness. These would usually start a few minutes to a few hours after the vaccination. What should I do?  If you think it is a severe allergic reaction or other emergency that can't wait, call 9-1-1 or get the person to the nearest hospital. Otherwise, call your doctor.  Afterward, the reaction should be reported to the Vaccine Adverse Event Reporting System (VAERS). Your doctor might file this report, or you can do it yourself through the VAERS web  site at www.vaers.SamedayNews.es, or by calling 934 711 3520. VAERS does not give medical advice. 6. The National Vaccine Injury Compensation Program The [employer] Vaccine Injury Compensation Program (VICP) is a federal program that was created to compensate people who may have been injured by certain vaccines. Persons who believe they may have been injured by a vaccine can learn about the program and about filing a claim by calling 234 115 3162 or visiting the [place] website at GoldCloset.com.ee. There is a time limit to file a claim for compensation. 7. How can I learn more?  Ask your doctor. He or she can give you the vaccine package insert or suggest other sources of information.  Call your local or state health department.  Contact the Centers for Disease Control and Prevention (CDC): ? Call 443-213-0695 (1-800-CDC-INFO) or ? Visit CDC's website at  http://hunter.com/ Vaccine Information Statement Tdap Vaccine (12/04/2013) This information is not intended to replace advice given to you by your health care provider. Make sure you discuss any questions you have with your health care provider. Document Released: 03/28/2012 Document Revised: 05/15/2018 Document Reviewed: 05/15/2018 Elsevier Interactive Patient Education  2019 [place]

## 2018-11-29 NOTE — Progress Notes (Signed)
Pre visit review using our clinic review tool, if applicable. No additional management support is needed unless otherwise documented below in the visit note. 

## 2018-11-30 NOTE — Progress Notes (Signed)
No pna 23 shot in ncir

## 2018-12-11 ENCOUNTER — Encounter: Payer: Self-pay | Admitting: Internal Medicine

## 2018-12-12 ENCOUNTER — Other Ambulatory Visit: Payer: Self-pay | Admitting: Internal Medicine

## 2018-12-12 DIAGNOSIS — N3281 Overactive bladder: Secondary | ICD-10-CM

## 2018-12-12 MED ORDER — OXYBUTYNIN CHLORIDE 5 MG PO TABS: 5.0000 mg | ORAL_TABLET | Freq: Every evening | ORAL | 3 refills | Status: DC | PRN

## 2018-12-15 ENCOUNTER — Encounter: Payer: Self-pay | Admitting: Internal Medicine

## 2018-12-27 ENCOUNTER — Encounter: Payer: Self-pay | Admitting: Internal Medicine

## 2019-01-06 ENCOUNTER — Encounter (HOSPITAL_COMMUNITY): Payer: Self-pay

## 2019-01-06 ENCOUNTER — Observation Stay (HOSPITAL_COMMUNITY)
Admission: EM | Admit: 2019-01-06 | Discharge: 2019-01-08 | Disposition: A | Discharge status: Home / Self Care | Payer: 59 | Attending: General Surgery | Admitting: General Surgery

## 2019-01-06 ENCOUNTER — Emergency Department (HOSPITAL_COMMUNITY): Payer: 59

## 2019-01-06 ENCOUNTER — Other Ambulatory Visit: Payer: Self-pay

## 2019-01-06 DIAGNOSIS — R1013 Epigastric pain: Secondary | ICD-10-CM

## 2019-01-06 DIAGNOSIS — K8010 Calculus of gallbladder with chronic cholecystitis without obstruction: Secondary | ICD-10-CM | POA: Diagnosis not present

## 2019-01-06 DIAGNOSIS — E559 Vitamin D deficiency, unspecified: Secondary | ICD-10-CM | POA: Insufficient documentation

## 2019-01-06 DIAGNOSIS — Z87891 Personal history of nicotine dependence: Secondary | ICD-10-CM | POA: Diagnosis not present

## 2019-01-06 DIAGNOSIS — E669 Obesity, unspecified: Secondary | ICD-10-CM | POA: Diagnosis not present

## 2019-01-06 DIAGNOSIS — K219 Gastro-esophageal reflux disease without esophagitis: Secondary | ICD-10-CM | POA: Diagnosis not present

## 2019-01-06 DIAGNOSIS — K8012 Calculus of gallbladder with acute and chronic cholecystitis without obstruction: Secondary | ICD-10-CM

## 2019-01-06 DIAGNOSIS — Z6835 Body mass index (BMI) 35.0-35.9, adult: Secondary | ICD-10-CM | POA: Insufficient documentation

## 2019-01-06 DIAGNOSIS — E785 Hyperlipidemia, unspecified: Secondary | ICD-10-CM | POA: Diagnosis not present

## 2019-01-06 DIAGNOSIS — I10 Essential (primary) hypertension: Secondary | ICD-10-CM | POA: Insufficient documentation

## 2019-01-06 DIAGNOSIS — K810 Acute cholecystitis: Secondary | ICD-10-CM | POA: Diagnosis present

## 2019-01-06 LAB — CBC
HCT: 42.2 % (ref 36.0–46.0)
Hemoglobin: 14.3 g/dL (ref 12.0–15.0)
MCH: 29.8 pg (ref 26.0–34.0)
MCHC: 33.9 g/dL (ref 30.0–36.0)
MCV: 87.9 fL (ref 80.0–100.0)
Platelets: 343 10*3/uL (ref 150–400)
RBC: 4.8 MIL/uL (ref 3.87–5.11)
RDW: 12.6 % (ref 11.5–15.5)
WBC: 9.1 10*3/uL (ref 4.0–10.5)
nRBC: 0 % (ref 0.0–0.2)

## 2019-01-06 LAB — BASIC METABOLIC PANEL
Anion gap: 11 (ref 5–15)
BUN: 9 mg/dL (ref 8–23)
CO2: 21 mmol/L — ABNORMAL LOW (ref 22–32)
Calcium: 9.8 mg/dL (ref 8.9–10.3)
Chloride: 104 mmol/L (ref 98–111)
Creatinine, Ser: 0.68 mg/dL (ref 0.44–1.00)
GFR calc Af Amer: >60 mL/min (ref >60)
GFR calc non Af Amer: >60 mL/min (ref >60)
Glucose, Bld: 129 mg/dL — ABNORMAL HIGH (ref 70–99)
Potassium: 3.7 mmol/L (ref 3.5–5.1)
Sodium: 136 mmol/L (ref 135–145)

## 2019-01-06 LAB — HEPATIC FUNCTION PANEL
ALT: 23 U/L (ref 0–44)
AST: 22 U/L (ref 15–41)
Albumin: 4.6 g/dL (ref 3.5–5.0)
Alkaline Phosphatase: 44 U/L (ref 38–126)
Bilirubin, Direct: 0.1 mg/dL (ref 0.0–0.2)
Indirect Bilirubin: 0.8 mg/dL (ref 0.3–0.9)
Total Bilirubin: 0.9 mg/dL (ref 0.3–1.2)
Total Protein: 8 g/dL (ref 6.5–8.1)

## 2019-01-06 LAB — LIPASE, BLOOD: Lipase: 22 U/L (ref 11–51)

## 2019-01-06 LAB — TROPONIN I: Troponin I: <0.03 ng/mL (ref <0.03)

## 2019-01-06 MED ORDER — ACETAMINOPHEN 500 MG PO TABS
1000.0000 mg | ORAL_TABLET | ORAL | Status: AC
  Administered 2019-01-07: 1000 mg via ORAL
  Filled 2019-01-06: qty 2

## 2019-01-06 MED ORDER — SODIUM CHLORIDE 0.9 % IV SOLN
2.0000 g | Freq: Once | INTRAVENOUS | Status: AC
  Administered 2019-01-06: 2 g via INTRAVENOUS
  Filled 2019-01-06: qty 20

## 2019-01-06 MED ORDER — FUROSEMIDE 20 MG PO TABS: 20.0000 mg | ORAL_TABLET | Freq: Every day | ORAL | Status: DC | PRN

## 2019-01-06 MED ORDER — ONDANSETRON 4 MG PO TBDP: 4.0000 mg | ORAL_TABLET | Freq: Four times a day (QID) | ORAL | Status: DC | PRN

## 2019-01-06 MED ORDER — ONDANSETRON HCL 4 MG/2ML IJ SOLN: 4.0000 mg | Freq: Four times a day (QID) | INTRAMUSCULAR | Status: DC | PRN

## 2019-01-06 MED ORDER — OXYBUTYNIN CHLORIDE 5 MG PO TABS: 5.0000 mg | ORAL_TABLET | Freq: Every evening | ORAL | Status: DC | PRN

## 2019-01-06 MED ORDER — FENTANYL CITRATE (PF) 100 MCG/2ML IJ SOLN
50.0000 ug | Freq: Once | INTRAMUSCULAR | Status: AC
  Administered 2019-01-06: 50 ug via INTRAVENOUS
  Filled 2019-01-06: qty 2

## 2019-01-06 MED ORDER — SODIUM CHLORIDE 0.9% FLUSH
3.0000 mL | Freq: Once | INTRAVENOUS | Status: AC
  Administered 2019-01-06: 3 mL via INTRAVENOUS

## 2019-01-06 MED ORDER — ONDANSETRON HCL 4 MG/2ML IJ SOLN
4.0000 mg | Freq: Once | INTRAMUSCULAR | Status: AC
  Administered 2019-01-06: 4 mg via INTRAVENOUS
  Filled 2019-01-06: qty 2

## 2019-01-06 MED ORDER — KCL IN DEXTROSE-NACL 40-5-0.9 MEQ/L-%-% IV SOLN
INTRAVENOUS | Status: DC
  Administered 2019-01-06 – 2019-01-07 (×3): via INTRAVENOUS
  Filled 2019-01-06 (×4): qty 1000

## 2019-01-06 MED ORDER — HYDRALAZINE HCL 20 MG/ML IJ SOLN: 10.0000 mg | INTRAMUSCULAR | Status: DC | PRN

## 2019-01-06 MED ORDER — SODIUM CHLORIDE 0.9 % IV SOLN
INTRAVENOUS | Status: DC
  Administered 2019-01-06: 13:00:00 via INTRAVENOUS

## 2019-01-06 MED ORDER — HYDROMORPHONE HCL 1 MG/ML IJ SOLN
1.0000 mg | INTRAMUSCULAR | Status: DC | PRN
  Administered 2019-01-06: 1 mg via INTRAVENOUS
  Filled 2019-01-06: qty 1

## 2019-01-06 MED ORDER — MORPHINE SULFATE (PF) 4 MG/ML IV SOLN
4.0000 mg | Freq: Once | INTRAVENOUS | Status: AC
  Administered 2019-01-06: 4 mg via INTRAVENOUS
  Filled 2019-01-06: qty 1

## 2019-01-06 MED ORDER — CELECOXIB 200 MG PO CAPS
400.0000 mg | ORAL_CAPSULE | ORAL | Status: AC
  Administered 2019-01-07: 400 mg via ORAL
  Filled 2019-01-06: qty 2

## 2019-01-06 MED ORDER — IRBESARTAN 150 MG PO TABS
150.0000 mg | ORAL_TABLET | Freq: Every day | ORAL | Status: DC
  Administered 2019-01-06 – 2019-01-08 (×2): 150 mg via ORAL
  Filled 2019-01-06 (×2): qty 1

## 2019-01-06 MED ORDER — GABAPENTIN 300 MG PO CAPS
300.0000 mg | ORAL_CAPSULE | ORAL | Status: AC
  Administered 2019-01-07: 300 mg via ORAL
  Filled 2019-01-06: qty 1

## 2019-01-06 NOTE — ED Provider Notes (Signed)
[facility] DEPT Provider Note   CSN: [id number] Arrival date & time: 01/06/19  1226    History   Chief Complaint Chief Complaint  Patient presents with  . Chest Pain    HPI Beth Fisher is a 68 y.o. female.     Patient is a 68 year old female with a history of hypertension, hyperlipidemia, peptic ulcer disease and inflammatory polyps of the colon presenting today with 14 days of worsening epigastric and lower chest pain.  Patient states that there was nothing she can think of that started the pain but she had started noticing it 14 days ago.  She states her doctor prescribed her a pill which she felt made the symptoms worse.  She describes it as a constant pain now that intermittently spasms causing severe pain that radiates around both sides of her chest into her shoulders.  She has had decreased appetite and feels like eating does make the pain a little bit worse.  She has had some nausea but denies any vomiting, bowel changes, fever.  She has had no cough or congestion but does have some shortness of breath.  Exertion does not make the pain worse.  Early on it seemed intermittent but now it is become more constant.  She is in a 7 out of 10 pain right now but she states when it spasms is a 10 out of 10 and takes her breath away.  The pain is not moved from when it started.  It does not radiate down the arms or into the jaw.  She also has a burning pain in her stomach.  Only prior abdominal surgery is for hernia repair.  He has been taking Nexium for the last 14 days hoping if it was an ulcer it would just get better.  This has not helped.  She does not drink alcohol or take NSAIDs.  The history is provided by the patient.  Chest Pain    Past Medical History:  Diagnosis Date  . Arthritis   . Carotid stenosis    b/l w/o sx's   . Fibromyalgia   . Fibromyalgia   . Gastric ulcer    1970s  . GERD (gastroesophageal reflux disease)   . History of colon  polyps   . History of UTI   . Hyperlipidemia   . Hypertension   . Inflammatory polyps of colon ([place])    08/2008 OK 08/2010 Ok  byk Dr Earlean Shawl  . Pneumonia 12/2011  . Prediabetes   . Ulcer   . UTI (urinary tract infection)   . Vitamin D deficiency     Patient Active Problem List   Diagnosis Date Noted  . Osteopenia 11/29/2018  . Prediabetes 11/29/2018  . Vitamin D deficiency 11/29/2018  . 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  . Left groin pain 11/18/2014  . Encounter for routine gynecological examination 05/08/2014  . B12 deficiency 12/29/2011  . 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 Anemia 12/29/2011  . Obesity 11/09/2011  . Gynecological examination 04/27/2011  . Routine general medical examination at a health care facility 04/20/2011  . Hypertension 01/12/2011  . Hyperlipidemia 01/12/2011  . Peptic ulcer disease 01/12/2011  . Fibromyalgia 01/12/2011  . Colon polyps 01/12/2011  . Menopause syndrome 01/12/2011    Past Surgical History:  Procedure Laterality Date  . HERNIA REPAIR  1978     OB History   No obstetric history on file.      Home Medications    Prior to Admission  medications   Medication Sig Start Date End Date Taking? Authorizing Provider  amitriptyline (ELAVIL) 25 MG tablet take 1 tablet by mouth at bedtime 05/14/15   Tower, Wynelle Fanny, MD  colesevelam [facility]) 625 MG tablet Take 2 tablets (1,250 mg total) by mouth daily with breakfast. 11/29/18   McLean-Scocuzza, Nino Glow, MD  colestipol (COLESTID) 1 g tablet Take 2 tablets (2 g total) by mouth daily. 12/03/15   Tower, Wynelle Fanny, MD  cyanocobalamin 1000 MCG tablet Take 1,000 mcg by mouth daily.    [provider]  famotidine (ACID REDUCER) 10 MG tablet Take 10 mg by mouth daily as needed. OTC as directed.    [provider]  fenofibrate micronized (ANTARA) 130 MG capsule Take 1 capsule (130 mg total) by mouth daily before breakfast. 05/14/15   Tower, Wynelle Fanny, MD  furosemide  (LASIX) 20 MG tablet Take 1 tablet (20 mg total) by mouth daily as needed. 11/29/18 11/29/19  McLean-Scocuzza, Nino Glow, MD  olmesartan (BENICAR) 20 MG tablet Take 20 mg by mouth daily.    [provider]  oxybutynin (DITROPAN) 5 MG tablet Take 1 tablet (5 mg total) by mouth at bedtime as needed (overactive bladder). 12/12/18   McLean-Scocuzza, Nino Glow, MD  rosuvastatin (CRESTOR) 5 MG tablet Take by mouth. 05/25/18   [provider]  Vitamin D, Ergocalciferol, (DRISDOL) 1.25 MG (50000 UT) CAPS capsule Take by mouth. 10/12/18   [provider]    Family History Family History  Problem Relation Age of Onset  . Hyperlipidemia Mother   . Alzheimer's disease Mother   . Cancer Mother        breast cancer dx'ed 38 y.o   . Depression Mother   . Miscarriages / [place] Mother   . Diabetes Father   . Heart disease Father        CABG x 7   . Hyperlipidemia Father   . Mental illness Father   . Alzheimer's disease Father   . Depression Father   . Early death Father   . Cancer Paternal Grandmother        colon  . Diabetes Paternal Grandmother   . Heart disease Cousin        sudden death less than 32 yrs old heart attack.  . Deep vein thrombosis Maternal Grandmother   . Cancer Maternal Grandfather        throat smoker    Social History Social History   Tobacco Use  . Smoking status: Former Smoker    Last attempt to quit: 10/11/1980    Years since quitting: 38.2  . Smokeless tobacco: Never Used  Substance Use Topics  . Alcohol use: No    Alcohol/week: 0.0 standard drinks  . Drug use: No     Allergies   Atorvastatin; Norvasc [amlodipine besylate]; Simvastatin; and Zetia [ezetimibe]   Review of Systems Review of Systems  Cardiovascular: Positive for chest pain.  All other systems reviewed and are negative.    Physical Exam Updated Vital Signs BP (!) 166/95   Pulse 76   Temp 98.5 F (36.9 C) (Oral)   Resp 18   LMP 10/11/2000   SpO2 98%   Physical  Exam Vitals signs and nursing note reviewed.  Constitutional:      General: She is not in acute distress.    Appearance: She is well-developed.     Comments: Appears uncomfortable  HENT:     Head: Normocephalic and atraumatic.     Mouth/Throat:  Mouth: Mucous membranes are dry.  Eyes:     Pupils: Pupils are equal, round, and reactive to light.  Cardiovascular:     Rate and Rhythm: Normal rate and regular rhythm.     Heart sounds: Normal heart sounds. No murmur. No friction rub.  Pulmonary:     Effort: Pulmonary effort is normal.     Breath sounds: Normal breath sounds. No wheezing or rales.  Abdominal:     General: Bowel sounds are normal. There is no distension.     Palpations: Abdomen is soft.     Tenderness: There is abdominal tenderness in the epigastric area. There is no right CVA tenderness, left CVA tenderness, guarding or rebound. Negative signs include Murphy's sign and McBurney's sign.  Musculoskeletal: Normal range of motion.        General: No tenderness.     Comments: No edema  Skin:    General: Skin is warm and dry.     Findings: No rash.  Neurological:     General: No focal deficit present.     Mental Status: She is alert and oriented to person, place, and time.     Cranial Nerves: No cranial nerve deficit.  Psychiatric:        Mood and Affect: Mood normal.        Behavior: Behavior normal.        Thought Content: Thought content normal.      ED Treatments / Results  Labs (all labs ordered are listed, but only abnormal results are displayed) Labs Reviewed  BASIC METABOLIC PANEL - Abnormal; Notable for the following components:      Result Value   CO2 21 (*)    Glucose, Bld 129 (*)    All other components within normal limits  CBC  TROPONIN I  LIPASE, BLOOD  HEPATIC FUNCTION PANEL    EKG EKG Interpretation  Date/Time:  Saturday January 06 2019 12:35:08 EDT Ventricular Rate:  83 PR Interval:    QRS Duration: 98 QT Interval:  352 QTC  Calculation: 414 R Axis:   -66 Text Interpretation:  Sinus rhythm Atrial premature complex Left anterior fascicular block Low voltage, precordial leads Probable anteroseptal infarct, old No previous tracing Confirmed by Blanchie Dessert [id number]) on 01/06/2019 12:52:34 PM   Radiology Dg Chest 2 View  Result Date: 01/06/2019 CLINICAL DATA:  Central chest pain. EXAM: CHEST - 2 VIEW COMPARISON:  Feb 10, 2012 FINDINGS: An elevated right hemidiaphragm remains. The cardiomediastinal silhouette is unremarkable. No pneumothorax. No pulmonary nodules or masses. No focal infiltrates. IMPRESSION: No active cardiopulmonary disease. Electronically Signed   By: Dorise Bullion III M.D   On: 01/06/2019 12:59    Procedures Procedures (including critical care time)  Medications Ordered in ED Medications  0.9 %  sodium chloride infusion ( Intravenous New Bag/Given 01/06/19 1313)  sodium chloride flush (NS) 0.9 % injection 3 mL (3 mLs Intravenous Given 01/06/19 1313)  ondansetron (ZOFRAN) injection 4 mg (4 mg Intravenous Given 01/06/19 1313)  morphine 4 MG/ML injection 4 mg (4 mg Intravenous Given 01/06/19 1313)     Initial Impression / Assessment and Plan / ED Course  I have reviewed the triage vital signs and the nursing notes.  Pertinent labs & imaging results that were available during my care of the patient were reviewed by me and considered in my medical decision making (see chart for details).       Patient is a 68 year old female presenting today with severe epigastric discomfort radiating into  her shoulders bilaterally.  Symptoms may be worse with eating but the pain is not moved.  It also has made her feel slightly short of breath.  She has no upper respiratory symptoms or fever.  On exam she has no Murphy sign but does have some epigastric tenderness.  She has no lower abdominal pain concerning for obstruction, diverticulitis or appendicitis.  EKG without acute findings and chest x-ray within normal  limits.  Lower suspicion that this is ACS, dissection or PE.  Concern for PUD, cholelithiasis.  CBC, CMP, lipase, troponin, right upper quadrant ultrasound pending.  Patient given pain control.  3:37 PM Labs wnl.  [place] pending.  Pt still having pain and given another dose of pain meds.  Pt checked out to Dr. Zenia Resides at 1530 Final Clinical Impressions(s) / ED Diagnoses   Final diagnoses:  None    ED Discharge Orders    None       Blanchie Dessert, MD 01/06/19 1537

## 2019-01-06 NOTE — ED Notes (Signed)
Report called to Janet RN

## 2019-01-06 NOTE — ED Provider Notes (Addendum)
Patient signed out to me by Dr. Maryan Rued pending abdominal ultrasound results.  Patient has evidence of cholelithiasis with mild gallbladder wall thickening.  She continues to have pain.  Concern for cholecystitis.  Will start antibiotics.  Will consult general surgery   Lacretia Leigh, MD 01/06/19 1717    Lacretia Leigh, MD 01/06/19 [phone]

## 2019-01-06 NOTE — ED Notes (Signed)
Patient transported to X-ray 

## 2019-01-06 NOTE — ED Triage Notes (Addendum)
Patient dropped off by husband.    C/O  "bad indigestion" X10 days. C/O chest pain (Stabbing/pressure/sore) epigastric region that also radiates across lower chest left to right.    Patient was recently put on medication for bladder spasms and noted side effects so stopped.   Hx. Hypertension and GERD Patient has not taken BP medication today.   Patient unable to sleep due to chest pain.   A/Ox4 Ambulatory in triage.

## 2019-01-06 NOTE — ED Notes (Signed)
ED TO INPATIENT HANDOFF REPORT  ED Nurse Name and Phone #: Tana Coast Name/Age/Gender Beth Fisher 68 y.o. female Room/Bed: WA10/WA10  Code Status   Code Status: Full Code  Home/SNF/Other Home Patient oriented to: self, place, time and situation Is this baseline? Yes   Triage Complete: Triage complete  Chief Complaint chest pain x 2 days  Triage Note Patient dropped off by husband.    C/O  "bad indigestion" X10 days. C/O chest pain (Stabbing/pressure/sore) epigastric region that also radiates across lower chest left to right.    Patient was recently put on medication for bladder spasms and noted side effects so stopped.   Hx. Hypertension and GERD Patient has not taken BP medication today.   Patient unable to sleep due to chest pain.   A/Ox4 Ambulatory in triage.      Allergies Allergies  Allergen Reactions  . Atorvastatin Other (See Comments)  . Norvasc [Amlodipine Besylate]     Leg swelling   . Simvastatin Other (See Comments)  . Zetia [Ezetimibe] Other (See Comments)    Muscle aches!!!    Level of Care/Admitting Diagnosis ED Disposition    ED Disposition Condition Comment   Admit  Hospital Area: [facility] [[id number]]  Level of Care: Med-Surg [16]  Diagnosis: Acute cholecystitis [575.0.ICD-9-CM]  Admitting Physician: CCS, [place]  Attending Physician: CCS, [place]  Bed request comments: 5W  PT Class (Do Not Modify): Observation [104]  PT Acc Code (Do Not Modify): Observation [10022]       B Medical/Surgery History Past Medical History:  Diagnosis Date  . Arthritis   . Carotid stenosis    b/l w/o sx's   . Fibromyalgia   . Fibromyalgia   . Gastric ulcer    1970s  . GERD (gastroesophageal reflux disease)   . History of colon polyps   . History of UTI   . Hyperlipidemia   . Hypertension   . Inflammatory polyps of colon ([place])    08/2008 OK 08/2010 Ok  byk Dr Earlean Shawl  . Pneumonia 12/2011  . Prediabetes   . Ulcer    . UTI (urinary tract infection)   . Vitamin D deficiency    Past Surgical History:  Procedure Laterality Date  . HERNIA REPAIR  1978     A IV Location/Drains/Wounds Patient Lines/Drains/Airways Status   Active Line/Drains/Airways    Name:   Placement date:   Placement time:   Site:   Days:   Peripheral IV 01/06/19 Left Antecubital   01/06/19    1306    Antecubital   less than 1          Intake/Output Last 24 hours  Intake/Output Summary (Last 24 hours) at 01/06/2019 1832 Last data filed at 01/06/2019 1829 Gross per 24 hour  Intake 1100 ml  Output -  Net 1100 ml    Labs/Imaging Results for orders placed or performed during the hospital encounter of 01/06/19 (from the past 48 hour(s))  Basic metabolic panel     Status: Abnormal   Collection Time: 01/06/19 12:41 PM  Result Value Ref Range   Sodium 136 135 - 145 mmol/L   Potassium 3.7 3.5 - 5.1 mmol/L   Chloride 104 98 - 111 mmol/L   CO2 21 (L) 22 - 32 mmol/L   Glucose, Bld 129 (H) 70 - 99 mg/dL   BUN 9 8 - 23 mg/dL   Creatinine, Ser 0.68 0.44 - 1.00 mg/dL   Calcium 9.8 8.9 - 10.3  mg/dL   GFR calc non Af Amer >60 >60 mL/min   GFR calc Af Amer >60 >60 mL/min   Anion gap 11 5 - 15    Comment: Performed at [facility], [place] [address], [place], [place] [address]  CBC     Status: None   Collection Time: 01/06/19 12:41 PM  Result Value Ref Range   WBC 9.1 4.0 - 10.5 K/uL   RBC 4.80 3.87 - 5.11 MIL/uL   Hemoglobin 14.3 12.0 - 15.0 g/dL   HCT 42.2 36.0 - 46.0 %   MCV 87.9 80.0 - 100.0 fL   MCH 29.8 26.0 - 34.0 pg   MCHC 33.9 30.0 - 36.0 g/dL   RDW 12.6 11.5 - 15.5 %   Platelets 343 150 - 400 K/uL   nRBC 0.0 0.0 - 0.2 %    Comment: Performed at [facility], [place] [address] [place], [place] [address]  Troponin I - ONCE - STAT     Status: None   Collection Time: 01/06/19 12:41 PM  Result Value Ref Range   Troponin I <0.03 <0.03 ng/mL    Comment: Performed at [facility], [place] [address], [place], [place] [address]  Lipase, blood     Status: None   Collection Time: 01/06/19 12:41 PM  Result Value Ref Range   Lipase 22 11 - 51 U/L    Comment: Performed at [facility], [place] [address] [place], [place] [address]  Hepatic function panel     Status: None   Collection Time: 01/06/19 12:41 PM  Result Value Ref Range   Total Protein 8.0 6.5 - 8.1 g/dL   Albumin 4.6 3.5 - 5.0 g/dL   AST 22 15 - 41 U/L   ALT 23 0 - 44 U/L   Alkaline Phosphatase 44 38 - 126 U/L   Total Bilirubin 0.9 0.3 - 1.2 mg/dL   Bilirubin, Direct 0.1 0.0 - 0.2 mg/dL   Indirect Bilirubin 0.8 0.3 - 0.9 mg/dL    Comment: Performed at [facility], [place] [address] [place],  [address]   Dg Chest 2 View  Result Date: 01/06/2019 CLINICAL DATA:  Central chest pain. EXAM: CHEST - 2 VIEW COMPARISON:  Feb 10, 2012 FINDINGS: An elevated right hemidiaphragm remains. The cardiomediastinal silhouette is unremarkable. No pneumothorax. No pulmonary nodules or masses. No focal infiltrates. IMPRESSION: No active cardiopulmonary disease. Electronically Signed   By: Dorise Bullion III M.D   On: 01/06/2019 12:59   US Abdomen Limited Ruq  Result Date: 01/06/2019 CLINICAL DATA:  Epigastric pain EXAM: ULTRASOUND ABDOMEN LIMITED RIGHT UPPER QUADRANT COMPARISON:  None. FINDINGS: Gallbladder: Cholelithiasis is identified. The largest stone measures 5.3 x 3.3 x 3.6 cm. No Murphy's sign. Gallbladder wall thickening is noted. There may be a small amount of pericholecystic fluid. Common bile duct: Diameter: 6.1 mm Liver: Diffuse increased echogenicity. Focal fatty sparing adjacent to the gallbladder fossa. Portal vein is patent on color Doppler imaging with normal direction of blood flow towards the liver. IMPRESSION: 1. Cholelithiasis and mild gallbladder wall thickening. A small amount of pericholecystic fluid may be present. No Murphy's sign. The findings are  not specific but acute cholecystitis is not excluded. A HIDA scan could further evaluate. 2. The common bile duct is 6.1 mm which is borderline. Recommend correlation with labs. 3. Hepatic steatosis. Electronically Signed   By: Dorise Bullion III M.D   On: 01/06/2019 16:46    Pending  Labs [employer] (From admission, onward)   None      Vitals/Pain Today's Vitals   01/06/19 1715 01/06/19 1730 01/06/19 1800 01/06/19 1811  BP: (!) 147/72 (!) 144/75 (!) 148/93   Pulse: 75 74 77   Resp: 18 18 (!) 22   Temp:      TempSrc:      SpO2: 100% 100% 99%   PainSc:    4     Isolation Precautions No active isolations  Medications Medications  0.9 %  sodium chloride infusion ( Intravenous Stopped 01/06/19 1828)  furosemide (LASIX) tablet 20 mg (has no administration in time range)  irbesartan (AVAPRO) tablet 150 mg (150 mg Oral Given 01/06/19 1810)  oxybutynin (DITROPAN) tablet 5 mg (has no administration in time range)  dextrose 5 % and 0.9 % NaCl with KCl 40 mEq/L infusion (has no administration in time range)  HYDROmorphone (DILAUDID) injection 1-2 mg (has no administration in time range)  ondansetron (ZOFRAN-ODT) disintegrating tablet 4 mg (has no administration in time range)    Or  ondansetron (ZOFRAN) injection 4 mg (has no administration in time range)  hydrALAZINE (APRESOLINE) injection 10 mg (has no administration in time range)  sodium chloride flush (NS) 0.9 % injection 3 mL (3 mLs Intravenous Given 01/06/19 1313)  ondansetron (ZOFRAN) injection 4 mg (4 mg Intravenous Given 01/06/19 1313)  morphine 4 MG/ML injection 4 mg (4 mg Intravenous Given 01/06/19 1313)  fentaNYL (SUBLIMAZE) injection 50 mcg (50 mcg Intravenous Given 01/06/19 1554)  cefTRIAXone (ROCEPHIN) 2 g in sodium chloride 0.9 % 100 mL IVPB (0 g Intravenous Stopped 01/06/19 1829)    Mobility walks Low fall risk   Focused Assessments Colelithiasis   R Recommendations: See Admitting Provider Note  Report given  to:   Additional Notes: .

## 2019-01-06 NOTE — H&P (Signed)
Beth Fisher is an 68 y.o. female.   Chief Complaint: abdominal pain  HPI: Pt is a 31 F with abdominal pain to the epigastrum and back.  She states the pain has been there for 2 weeks.  She states that there has been an increase in pain.  She describes it as stabbing and scratching in sensation.  She has had some nausea, no fevers.   Pt was worked up for ACS and was found to have normal TPI.  She underwent US and was found to large gallstones with GB wall thickening.  LFT and WBCs were within normal limits.  Surgery was consulted for further evaluation and mgmt.  Past Medical History:  Diagnosis Date  . Arthritis   . Carotid stenosis    b/l w/o sx's   . Fibromyalgia   . Fibromyalgia   . Gastric ulcer    1970s  . GERD (gastroesophageal reflux disease)   . History of colon polyps   . History of UTI   . Hyperlipidemia   . Hypertension   . Inflammatory polyps of colon ([place])    08/2008 OK 08/2010 Ok  byk Dr Earlean Shawl  . Pneumonia 12/2011  . Prediabetes   . Ulcer   . UTI (urinary tract infection)   . Vitamin D deficiency     Past Surgical History:  Procedure Laterality Date  . HERNIA REPAIR  1978    Family History  Problem Relation Age of Onset  . Hyperlipidemia Mother   . Alzheimer's disease Mother   . Cancer Mother        breast cancer dx'ed 57 y.o   . Depression Mother   . Miscarriages / [place] Mother   . Diabetes Father   . Heart disease Father        CABG x 7   . Hyperlipidemia Father   . Mental illness Father   . Alzheimer's disease Father   . Depression Father   . Early death Father   . Cancer Paternal Grandmother        colon  . Diabetes Paternal Grandmother   . Heart disease Cousin        sudden death less than 56 yrs old heart attack.  . Deep vein thrombosis Maternal Grandmother   . Cancer Maternal Grandfather        throat smoker   Social History:  reports that she quit smoking about 38 years ago. She has never used smokeless tobacco. She reports  that she does not drink alcohol or use drugs.  Allergies:  Allergies  Allergen Reactions  . Atorvastatin Other (See Comments)  . Norvasc [Amlodipine Besylate]     Leg swelling   . Simvastatin Other (See Comments)  . Zetia [Ezetimibe] Other (See Comments)    Muscle aches!!!    (Not in a hospital admission)   Results for orders placed or performed during the hospital encounter of 01/06/19 (from the past 48 hour(s))  Basic metabolic panel     Status: Abnormal   Collection Time: 01/06/19 12:41 PM  Result Value Ref Range   Sodium 136 135 - 145 mmol/L   Potassium 3.7 3.5 - 5.1 mmol/L   Chloride 104 98 - 111 mmol/L   CO2 21 (L) 22 - 32 mmol/L   Glucose, Bld 129 (H) 70 - 99 mg/dL   BUN 9 8 - 23 mg/dL   Creatinine, Ser 0.68 0.44 - 1.00 mg/dL   Calcium 9.8 8.9 - 10.3 mg/dL   GFR calc non Af  Amer >60 >60 mL/min   GFR calc Af Amer >60 >60 mL/min   Anion gap 11 5 - 15    Comment: Performed at [facility], [place] [address] [place], [place] [address]  CBC     Status: None   Collection Time: 01/06/19 12:41 PM  Result Value Ref Range   WBC 9.1 4.0 - 10.5 K/uL   RBC 4.80 3.87 - 5.11 MIL/uL   Hemoglobin 14.3 12.0 - 15.0 g/dL   HCT 42.2 36.0 - 46.0 %   MCV 87.9 80.0 - 100.0 fL   MCH 29.8 26.0 - 34.0 pg   MCHC 33.9 30.0 - 36.0 g/dL   RDW 12.6 11.5 - 15.5 %   Platelets 343 150 - 400 K/uL   nRBC 0.0 0.0 - 0.2 %    Comment: Performed at [facility], [place] [address], [place], [place] [address]  Troponin I - ONCE - STAT     Status: None   Collection Time: 01/06/19 12:41 PM  Result Value Ref Range   Troponin I <0.03 <0.03 ng/mL    Comment: Performed at [facility], [place] [address], [place], [place] [address]  Lipase, blood     Status: None   Collection Time: 01/06/19 12:41 PM  Result Value Ref Range   Lipase 22 11 - 51 U/L    Comment: Performed at [facility], [place] [address], [place], [place] [address]  Hepatic  function panel     Status: None   Collection Time: 01/06/19 12:41 PM  Result Value Ref Range   Total Protein 8.0 6.5 - 8.1 g/dL   Albumin 4.6 3.5 - 5.0 g/dL   AST 22 15 - 41 U/L   ALT 23 0 - 44 U/L   Alkaline Phosphatase 44 38 - 126 U/L   Total Bilirubin 0.9 0.3 - 1.2 mg/dL   Bilirubin, Direct 0.1 0.0 - 0.2 mg/dL   Indirect Bilirubin 0.8 0.3 - 0.9 mg/dL    Comment: Performed at [facility], [place] [address] [place], [place] [address]   Dg Chest 2 View  Result Date: 01/06/2019 CLINICAL DATA:  Central chest pain. EXAM: CHEST - 2 VIEW COMPARISON:  Feb 10, 2012 FINDINGS: An elevated right hemidiaphragm remains. The cardiomediastinal silhouette is unremarkable. No pneumothorax. No pulmonary nodules or masses. No focal infiltrates. IMPRESSION: No active cardiopulmonary disease. Electronically Signed   By: Dorise Bullion III M.D   On: 01/06/2019 12:59   US Abdomen Limited Ruq  Result Date: 01/06/2019 CLINICAL DATA:  Epigastric pain EXAM: ULTRASOUND ABDOMEN LIMITED RIGHT UPPER QUADRANT COMPARISON:  None. FINDINGS: Gallbladder: Cholelithiasis is identified. The largest stone measures 5.3 x 3.3 x 3.6 cm. No Murphy's sign. Gallbladder wall thickening is noted. There may be a small amount of pericholecystic fluid. Common bile duct: Diameter: 6.1 mm Liver: Diffuse increased echogenicity. Focal fatty sparing adjacent to the gallbladder fossa. Portal vein is patent on color Doppler imaging with normal direction of blood flow towards the liver. IMPRESSION: 1. Cholelithiasis and mild gallbladder wall thickening. A small amount of pericholecystic fluid may be present. No Murphy's sign. The findings are not specific but acute cholecystitis is not excluded. A HIDA scan could further evaluate. 2. The common bile duct is 6.1 mm which is borderline. Recommend correlation with labs. 3. Hepatic steatosis. Electronically Signed   By: Dorise Bullion III M.D   On: 01/06/2019 16:46    Review of Systems   Constitutional: Negative for chills,  fever and malaise/fatigue.  HENT: Negative for ear discharge, hearing loss and sore throat.   Eyes: Negative for blurred vision and discharge.  Respiratory: Negative for cough and shortness of breath.   Cardiovascular: Negative for chest pain, orthopnea and leg swelling.  Gastrointestinal: Positive for abdominal pain and nausea. Negative for constipation, diarrhea, heartburn and vomiting.  Musculoskeletal: Negative for myalgias and neck pain.  Skin: Negative for itching and rash.  Neurological: Negative for dizziness, focal weakness, seizures and loss of consciousness.  Endo/Heme/Allergies: Negative for environmental allergies. Does not bruise/bleed easily.  Psychiatric/Behavioral: Negative for depression and suicidal ideas.  All other systems reviewed and are negative.   Blood pressure (!) 147/72, pulse 75, temperature 98.5 F (36.9 C), temperature source Oral, resp. rate 18, last menstrual period 10/11/2000, SpO2 100 %. Physical Exam  Constitutional: She is oriented to person, place, and time. Vital signs are normal. She appears well-developed and well-nourished.  Conversant No acute distress  Eyes: Lids are normal. No scleral icterus.  No lid lag Moist conjunctiva  Neck: No tracheal tenderness present. No thyromegaly present.  No cervical lymphadenopathy  Cardiovascular: Normal rate, regular rhythm and intact distal pulses.  No murmur heard. Respiratory: Effort normal and breath sounds normal. She has no wheezes. She has no rales.  GI: Soft. She exhibits no distension and no mass. There is no hepatosplenomegaly. There is abdominal tenderness (epigastrum/RUQ). There is no rebound and no guarding. No hernia.  Neurological: She is alert and oriented to person, place, and time.  Normal gait and station  Skin: Skin is warm. No rash noted. No cyanosis. Nails show no clubbing.  Normal skin turgor  Psychiatric: Judgment normal.  Appropriate affect      Assessment/Plan 69 F with acute cholecystitis . Arthritis  . Carotid stenosis   b/l w/o sx's  . Fibromyalgia  . Fibromyalgia  . GERD (gastroesophageal reflux disease)  . Hyperlipidemia  . Hypertension  . Prediabetes  . Vitamin D deficiency       1. Admit, NPO p MN 2. IV abx given in ED 3. Consent for Lap chole Sun AM.   4. All risks and benefits were discussed with the patient to generally include: infection, bleeding, possible need for post op ERCP, damage to the bile ducts, and bile leak. Alternatives were offered and described.  All questions were answered and the patient voiced understanding of the procedure and wishes to proceed at this point with a laparoscopic cholecystectomy   Ralene Ok, MD 01/06/2019, 5:26 PM

## 2019-01-07 ENCOUNTER — Encounter (HOSPITAL_COMMUNITY): Payer: Self-pay | Admitting: Certified Registered"

## 2019-01-07 ENCOUNTER — Observation Stay (HOSPITAL_COMMUNITY): Payer: 59 | Admitting: Anesthesiology

## 2019-01-07 ENCOUNTER — Encounter (HOSPITAL_COMMUNITY)
Admission: EM | Disposition: A | Discharge status: Home / Self Care | Payer: Self-pay | Source: Home / Self Care | Attending: Emergency Medicine

## 2019-01-07 HISTORY — PX: CHOLECYSTECTOMY: SHX55

## 2019-01-07 LAB — MRSA PCR SCREENING: MRSA by PCR: NEGATIVE

## 2019-01-07 LAB — GLUCOSE, CAPILLARY: Glucose-Capillary: 148 mg/dL — ABNORMAL HIGH (ref 70–99)

## 2019-01-07 SURGERY — LAPAROSCOPIC CHOLECYSTECTOMY WITH INTRAOPERATIVE CHOLANGIOGRAM
Anesthesia: General

## 2019-01-07 MED ORDER — PROPOFOL 10 MG/ML IV BOLUS
INTRAVENOUS | Status: AC
  Filled 2019-01-07: qty 20

## 2019-01-07 MED ORDER — ONDANSETRON HCL 4 MG/2ML IJ SOLN
INTRAMUSCULAR | Status: AC
  Filled 2019-01-07: qty 2

## 2019-01-07 MED ORDER — ROCURONIUM BROMIDE 10 MG/ML (PF) SYRINGE
PREFILLED_SYRINGE | INTRAVENOUS | Status: DC | PRN
  Administered 2019-01-07: 50 mg via INTRAVENOUS

## 2019-01-07 MED ORDER — MIDAZOLAM HCL 2 MG/2ML IJ SOLN
INTRAMUSCULAR | Status: AC
  Filled 2019-01-07: qty 2

## 2019-01-07 MED ORDER — LACTATED RINGERS IV SOLN
INTRAVENOUS | Status: DC | PRN
  Administered 2019-01-07: 07:00:00 via INTRAVENOUS

## 2019-01-07 MED ORDER — DEXAMETHASONE SODIUM PHOSPHATE 10 MG/ML IJ SOLN
INTRAMUSCULAR | Status: DC | PRN
  Administered 2019-01-07: 10 mg via INTRAVENOUS

## 2019-01-07 MED ORDER — PROPOFOL 10 MG/ML IV BOLUS
INTRAVENOUS | Status: DC | PRN
  Administered 2019-01-07: 160 mg via INTRAVENOUS

## 2019-01-07 MED ORDER — LIP MEDEX EX OINT
TOPICAL_OINTMENT | CUTANEOUS | Status: AC
  Administered 2019-01-07: 10:00:00
  Filled 2019-01-07: qty 7

## 2019-01-07 MED ORDER — LACTATED RINGERS IR SOLN
Status: DC | PRN
  Administered 2019-01-07: 1000 mL

## 2019-01-07 MED ORDER — ONDANSETRON HCL 4 MG/2ML IJ SOLN
INTRAMUSCULAR | Status: DC | PRN
  Administered 2019-01-07: 4 mg via INTRAVENOUS

## 2019-01-07 MED ORDER — ROCURONIUM BROMIDE 100 MG/10ML IV SOLN
INTRAVENOUS | Status: AC
  Filled 2019-01-07: qty 1

## 2019-01-07 MED ORDER — SUGAMMADEX SODIUM 500 MG/5ML IV SOLN
INTRAVENOUS | Status: DC | PRN
  Administered 2019-01-07: 200 mg via INTRAVENOUS

## 2019-01-07 MED ORDER — FENTANYL CITRATE (PF) 100 MCG/2ML IJ SOLN
INTRAMUSCULAR | Status: AC
  Administered 2019-01-07: 50 ug via INTRAVENOUS
  Filled 2019-01-07: qty 2

## 2019-01-07 MED ORDER — PHENYLEPHRINE 40 MCG/ML (10ML) SYRINGE FOR IV PUSH (FOR BLOOD PRESSURE SUPPORT)
PREFILLED_SYRINGE | INTRAVENOUS | Status: AC
  Filled 2019-01-07: qty 10

## 2019-01-07 MED ORDER — DEXAMETHASONE SODIUM PHOSPHATE 10 MG/ML IJ SOLN
INTRAMUSCULAR | Status: AC
  Filled 2019-01-07: qty 1

## 2019-01-07 MED ORDER — HYDROCODONE-ACETAMINOPHEN 5-325 MG PO TABS: 1.0000 | ORAL_TABLET | ORAL | Status: DC | PRN

## 2019-01-07 MED ORDER — FENTANYL CITRATE (PF) 100 MCG/2ML IJ SOLN
INTRAMUSCULAR | Status: DC | PRN
  Administered 2019-01-07: 100 ug via INTRAVENOUS
  Administered 2019-01-07 (×2): 50 ug via INTRAVENOUS

## 2019-01-07 MED ORDER — BUPIVACAINE-EPINEPHRINE (PF) 0.25% -1:200000 IJ SOLN
INTRAMUSCULAR | Status: DC | PRN
  Administered 2019-01-07: 8 mL

## 2019-01-07 MED ORDER — MIDAZOLAM HCL 5 MG/5ML IJ SOLN
INTRAMUSCULAR | Status: DC | PRN
  Administered 2019-01-07: 2 mg via INTRAVENOUS

## 2019-01-07 MED ORDER — TRAMADOL HCL 50 MG PO TABS: 50.0000 mg | ORAL_TABLET | Freq: Four times a day (QID) | ORAL | Status: DC | PRN

## 2019-01-07 MED ORDER — LIDOCAINE 2% (20 MG/ML) 5 ML SYRINGE
INTRAMUSCULAR | Status: DC | PRN
  Administered 2019-01-07: 100 mg via INTRAVENOUS

## 2019-01-07 MED ORDER — PHENYLEPHRINE 40 MCG/ML (10ML) SYRINGE FOR IV PUSH (FOR BLOOD PRESSURE SUPPORT)
PREFILLED_SYRINGE | INTRAVENOUS | Status: DC | PRN
  Administered 2019-01-07 (×3): 80 ug via INTRAVENOUS

## 2019-01-07 MED ORDER — FENTANYL CITRATE (PF) 100 MCG/2ML IJ SOLN
25.0000 ug | INTRAMUSCULAR | Status: DC | PRN
  Administered 2019-01-07: 50 ug via INTRAVENOUS

## 2019-01-07 MED ORDER — FENTANYL CITRATE (PF) 250 MCG/5ML IJ SOLN
INTRAMUSCULAR | Status: AC
  Filled 2019-01-07: qty 5

## 2019-01-07 MED ORDER — LIDOCAINE 2% (20 MG/ML) 5 ML SYRINGE
INTRAMUSCULAR | Status: AC
  Filled 2019-01-07: qty 5

## 2019-01-07 MED ORDER — BUPIVACAINE-EPINEPHRINE (PF) 0.25% -1:200000 IJ SOLN
INTRAMUSCULAR | Status: AC
  Filled 2019-01-07: qty 30

## 2019-01-07 MED ORDER — PROMETHAZINE HCL 25 MG/ML IJ SOLN: 6.2500 mg | INTRAMUSCULAR | Status: DC | PRN

## 2019-01-07 SURGICAL SUPPLY — 35 items
APPLIER CLIP 5 13 M/L LIGAMAX5 (MISCELLANEOUS)
CABLE HIGH FREQUENCY MONO STRZ (ELECTRODE) ×3 IMPLANT
CHLORAPREP W/TINT 26 (MISCELLANEOUS) ×3 IMPLANT
CLIP APPLIE 5 13 M/L LIGAMAX5 (MISCELLANEOUS) IMPLANT
CLIP VESOLOCK MED LG 6/CT (CLIP) ×6 IMPLANT
COVER MAYO STAND STRL (DRAPES) IMPLANT
COVER TRANSDUCER ULTRASND (DRAPES) ×3 IMPLANT
COVER WAND RF STERILE (DRAPES) IMPLANT
DECANTER SPIKE VIAL GLASS SM (MISCELLANEOUS) ×3 IMPLANT
DERMABOND ADVANCED (GAUZE/BANDAGES/DRESSINGS) ×4
DERMABOND ADVANCED .7 DNX12 (GAUZE/BANDAGES/DRESSINGS) ×2 IMPLANT
DEVICE TROCAR PUNCTURE CLOSURE (ENDOMECHANICALS) ×3 IMPLANT
DRAPE C-ARM 42X120 X-RAY (DRAPES) IMPLANT
ELECT REM PT RETURN 15FT ADLT (MISCELLANEOUS) ×3 IMPLANT
GAUZE SPONGE 2X2 8PLY STRL LF (GAUZE/BANDAGES/DRESSINGS) IMPLANT
GLOVE BIO SURGEON STRL SZ7.5 (GLOVE) ×6 IMPLANT
GOWN STRL REUS W/TWL XL LVL3 (GOWN DISPOSABLE) ×6 IMPLANT
HEMOSTAT SURGICEL 4X8 (HEMOSTASIS) ×3 IMPLANT
KIT BASIN OR (CUSTOM PROCEDURE TRAY) ×3 IMPLANT
KIT TURNOVER KIT A (KITS) ×3 IMPLANT
NEEDLE INSUFFLATION 14GA 120MM (NEEDLE) ×3 IMPLANT
POUCH RETRIEVAL ECOSAC 10 (ENDOMECHANICALS) ×1 IMPLANT
POUCH RETRIEVAL ECOSAC 10MM (ENDOMECHANICALS) ×2
SCISSORS LAP 5X35 DISP (ENDOMECHANICALS) ×3 IMPLANT
SET CHOLANGIOGRAPH MIX (MISCELLANEOUS) IMPLANT
SET IRRIG TUBING LAPAROSCOPIC (IRRIGATION / IRRIGATOR) ×3 IMPLANT
SET TUBE SMOKE EVAC HIGH FLOW (TUBING) ×3 IMPLANT
SLEEVE XCEL OPT CAN 5 100 (ENDOMECHANICALS) ×6 IMPLANT
SPONGE GAUZE 2X2 STER 10/PKG (GAUZE/BANDAGES/DRESSINGS)
SUT MNCRL AB 4-0 PS2 18 (SUTURE) ×3 IMPLANT
SUT VICRYL 0 UR6 27IN ABS (SUTURE) ×3 IMPLANT
TOWEL OR 17X26 10 PK STRL BLUE (TOWEL DISPOSABLE) ×3 IMPLANT
TRAY LAPAROSCOPIC (CUSTOM PROCEDURE TRAY) ×3 IMPLANT
TROCAR BLADELESS OPT 5 100 (ENDOMECHANICALS) ×3 IMPLANT
TROCAR XCEL NON-BLD 11X100MML (ENDOMECHANICALS) ×3 IMPLANT

## 2019-01-07 NOTE — Anesthesia Preprocedure Evaluation (Signed)
Anesthesia Evaluation  Patient identified by MRN, date of birth, ID band Patient awake    Reviewed: Allergy & Precautions, NPO status , Patient's Chart, lab work & pertinent test results  Airway Mallampati: III  TM Distance: >3 FB Neck ROM: Full    Dental  (+) Dental Advisory Given   Pulmonary former smoker,    breath sounds clear to auscultation       Cardiovascular hypertension, Pt. on medications  Rhythm:Regular Rate:Normal     Neuro/Psych  Neuromuscular disease    GI/Hepatic Neg liver ROS, PUD, GERD  ,  Endo/Other  negative endocrine ROS  Renal/GU negative Renal ROS     Musculoskeletal  (+) Arthritis ,   Abdominal   Peds  Hematology negative hematology ROS (+)   Anesthesia Other Findings   Reproductive/Obstetrics                             Lab Results  Component Value Date   WBC 9.1 01/06/2019   HGB 14.3 01/06/2019   HCT 42.2 01/06/2019   MCV 87.9 01/06/2019   PLT 343 01/06/2019   Lab Results  Component Value Date   CREATININE 0.68 01/06/2019   BUN 9 01/06/2019   NA 136 01/06/2019   K 3.7 01/06/2019   CL 104 01/06/2019   CO2 21 (L) 01/06/2019    Anesthesia Physical Anesthesia Plan  ASA: II  Anesthesia Plan: General   Post-op Pain Management:    Induction: Intravenous  PONV Risk Score and Plan: 3 and Dexamethasone, Ondansetron and Treatment may vary due to age or medical condition  Airway Management Planned: Oral ETT  Additional Equipment:   Intra-op Plan:   Post-operative Plan: Extubation in OR  Informed Consent: I have reviewed the patients History and Physical, chart, labs and discussed the procedure including the risks, benefits and alternatives for the proposed anesthesia with the patient or authorized representative who has indicated his/her understanding and acceptance.     Dental advisory given  Plan Discussed with: CRNA  Anesthesia Plan  Comments:         Anesthesia Quick Evaluation

## 2019-01-07 NOTE — Transfer of Care (Signed)
Immediate Anesthesia Transfer of Care Note  Patient: Beth Fisher  Procedure(s) Performed: LAPAROSCOPIC CHOLECYSTECTOMY (N/A )  Patient Location: PACU  Anesthesia Type:General  Level of Consciousness: awake, alert  and oriented  Airway & Oxygen Therapy: Patient Spontanous Breathing and Patient connected to face mask oxygen  Post-op Assessment: Report given to RN and Post -op Vital signs reviewed and stable  Post vital signs: Reviewed and stable  Last Vitals:  Vitals Value Taken Time  BP 125/57 01/07/2019  8:32 AM  Temp    Pulse 71 01/07/2019  8:36 AM  Resp 13 01/07/2019  8:36 AM  SpO2 98 % 01/07/2019  8:36 AM  Vitals shown include unvalidated device data.  Last Pain:  Vitals:   01/07/19 0623  TempSrc: Oral  PainSc:          Complications: No apparent anesthesia complications

## 2019-01-07 NOTE — Anesthesia Procedure Notes (Signed)
Procedure Name: Intubation Date/Time: 01/07/2019 7:16 AM Performed by: Konstance Happel D, CRNA Pre-anesthesia Checklist: Patient identified, Emergency Drugs available, Suction available and Patient being monitored Patient Re-evaluated:Patient Re-evaluated prior to induction Oxygen Delivery Method: Circle system utilized Preoxygenation: Pre-oxygenation with 100% oxygen Induction Type: IV induction Ventilation: Mask ventilation without difficulty Laryngoscope Size: Mac and 4 Grade View: Grade III Tube type: Oral Tube size: 7.0 mm Number of attempts: 1 Airway Equipment and Method: Bougie stylet Placement Confirmation: ETT inserted through vocal cords under direct vision,  positive ETCO2 and breath sounds checked- equal and bilateral Secured at: 21 cm Tube secured with: Tape Dental Injury: Teeth and Oropharynx as per pre-operative assessment

## 2019-01-07 NOTE — Anesthesia Postprocedure Evaluation (Signed)
Anesthesia Post Note  Patient: Beth Fisher  Procedure(s) Performed: LAPAROSCOPIC CHOLECYSTECTOMY (N/A )     Patient location during evaluation: PACU Anesthesia Type: General Level of consciousness: awake and alert Pain management: pain level controlled Vital Signs Assessment: post-procedure vital signs reviewed and stable Respiratory status: spontaneous breathing, nonlabored ventilation, respiratory function stable and patient connected to nasal cannula oxygen Cardiovascular status: blood pressure returned to baseline and stable Postop Assessment: no apparent nausea or vomiting Anesthetic complications: no    Last Vitals:  Vitals:   01/07/19 0930 01/07/19 0950  BP: (!) 143/78 (!) 145/86  Pulse: 68 70  Resp: 13 16  Temp: 36.6 C 36.8 C  SpO2: 94% 95%    Last Pain:  Vitals:   01/07/19 0950  TempSrc: Oral  PainSc: 0-No pain                 Tiajuana Amass

## 2019-01-07 NOTE — Progress Notes (Signed)
Made daughter aware of patients updated status.

## 2019-01-07 NOTE — Op Note (Signed)
01/07/2019  8:18 AM  PATIENT:  Beth Fisher  68 y.o. female  PRE-OPERATIVE DIAGNOSIS:  Acute cholecystitis  POST-OPERATIVE DIAGNOSIS:  Acute cholecystitis, gallstones  PROCEDURE:  Procedure(s): LAPAROSCOPIC CHOLECYSTECTOMY (N/A)  SURGEON:  Surgeon(s) and Role:    Ralene Ok, MD - Primary    * Jovita Kussmaul, MD - Assisting   ANESTHESIA:   local and general  EBL:  15cc   BLOOD ADMINISTERED:none  DRAINS: none   LOCAL MEDICATIONS USED:  BUPIVICAINE   SPECIMEN:  Source of Specimen:  gallbladder  DISPOSITION OF SPECIMEN:  PATHOLOGY  COUNTS:  YES  TOURNIQUET:  * No tourniquets in log *  DICTATION: .Dragon Dictation The patient was taken to the operating and placed in the supine position with bilateral SCDs in place.  The patient was prepped and draped in the usual sterile fashion. A time out was called and all facts were verified. A pneumoperitoneum was obtained via A Veress needle technique to a pressure of 19mm of mercury.  A 63mm trochar was then placed in the right upper quadrant under visualization, and there were no injuries to any abdominal organs. A 11 mm port was then placed in the umbilical region after infiltrating with local anesthesia under direct visualization. A second and third epigastric port and right lower quadrant port placement under direct visualization, respectively.  T  The gallbladder was identified and seen to be very inflamed.  It was grapsed and retracted, the peritoneum was then sharply dissected from the gallbladder and this dissection was carried down to Calot's triangle. The cystic duct was identified and was very short.  It was stripped away circumferentially and seen going into the gallbladder 360, the critical angle was obtained.  2 clips were placed proximally one distally and the cystic duct transected. The cystic artery was identified and 2 clips placed proximally and one distally and transected.  We then proceeded to remove the  gallbladder off the hepatic fossa with Bovie cautery. A piece of Surgicel was plaed in the gallbladder fossa.  A retrieval bag was then placed in the abdomen and gallbladder placed in the bag. The hepatic fossa was then reexamined and hemostasis was achieved with Bovie cautery and was excellent at the end of the case.   The subhepatic fossa and perihepatic fossa was then irrigated until the effluent was clear.  The gallbladder and bag were removed from the abdominal cavity. The 11 mm trocar fascia was reapproximated with a PMI and #1 Vicryl x4.  The pneumoperitoneum was evacuated and all trochars removed under direct visulalization.  The skin was then closed with 4-0 Monocryl and the skin dressed with Dermabond.    The patient was awaken from general anesthesia and taken to the recovery room in stable condition.   PLAN OF CARE: Admit for overnight observation  PATIENT DISPOSITION:  PACU - hemodynamically stable.   Delay start of Pharmacological VTE agent (>24hrs) due to surgical blood loss or risk of bleeding: not applicable

## 2019-01-08 ENCOUNTER — Encounter (HOSPITAL_COMMUNITY): Payer: Self-pay | Admitting: General Surgery

## 2019-01-08 MED ORDER — HYDROCODONE-ACETAMINOPHEN 5-325 MG PO TABS: 1.0000 | ORAL_TABLET | Freq: Four times a day (QID) | ORAL | 0 refills | Status: DC | PRN

## 2019-01-08 MED ORDER — DOCUSATE SODIUM 100 MG PO CAPS: 100.0000 mg | ORAL_CAPSULE | Freq: Every day | ORAL | 2 refills | Status: AC | PRN

## 2019-01-08 MED ORDER — POLYETHYLENE GLYCOL 3350 17 G PO PACK: 17.0000 g | PACK | Freq: Every day | ORAL | 0 refills | Status: DC

## 2019-01-08 NOTE — Discharge Summary (Addendum)
Patient ID: Beth Fisher [id number] 1950-11-18 68 y.o.  Admit date: 01/06/2019 Discharge date: 01/08/2019  Admitting Diagnosis: Acute Cholecystitis   Discharge Diagnosis Patient Active Problem List   Diagnosis Date Noted  . Acute cholecystitis 01/06/2019  . Osteopenia 11/29/2018  . Prediabetes 11/29/2018  . Vitamin D deficiency 11/29/2018  . 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  . Left groin pain 11/18/2014  . Encounter for routine gynecological examination 05/08/2014  . B12 deficiency 12/29/2011  . 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 Anemia 12/29/2011  . Obesity 11/09/2011  . Gynecological examination 04/27/2011  . Routine general medical examination at a health care facility 04/20/2011  . Hypertension 01/12/2011  . Hyperlipidemia 01/12/2011  . Peptic ulcer disease 01/12/2011  . Fibromyalgia 01/12/2011  . Colon polyps 01/12/2011  . Menopause syndrome 01/12/2011    Consultants None  Procedures Laparoscopic Cholecystectomy, Dr. Ralene Ok, 3/29  Surgical Pathology  Pending  Hospital Course:  This is a 68 y.o. female who presented to [facility] with 2 weeks of epigastric abdominal pain. She was found to have large gallstones with wall thickening. The patient was admitted and underwent a laparoscopic cholecystectomy as above.  The patient tolerated the procedure well.  On POD 1, the patient was tolerating a regular diet, voiding well, mobilizing, and pain was controlled with oral pain medications.  The patient was stable for DC home at this time with appropriate follow up made.  Physical Exam: Gen: Awake and alert, NAD Heart: RRR Lungs: Normal effort Abd: Soft, ND, appropriately tender, incisions c/d/i with dermabond overlying, +BS  Allergies as of 01/08/2019      Reactions   Atorvastatin Other (See Comments)   Norvasc [amlodipine Besylate]    Leg swelling   Simvastatin Other (See Comments)   Zetia [ezetimibe] Other (See Comments)   Muscle  aches!!!      Medication List    TAKE these medications   acetaminophen 325 MG tablet Commonly known as:  TYLENOL Take 650 mg by mouth every 6 (six) hours as needed.   Acid Reducer 10 MG tablet Generic drug:  famotidine Take 10 mg by mouth daily as needed for heartburn.   amitriptyline 25 MG tablet Commonly known as:  ELAVIL take 1 tablet by mouth at bedtime What changed:    how much to take  how to take this  when to take this  additional instructions   colesevelam 625 MG tablet Commonly known as:  Welchol Take 2 tablets (1,250 mg total) by mouth daily with breakfast.   colestipol 1 g tablet Commonly known as:  Colestid Take 2 tablets (2 g total) by mouth daily.   docusate sodium 100 MG capsule Commonly known as:  Colace Take 1 capsule (100 mg total) by mouth daily as needed.   fenofibrate micronized 130 MG capsule Commonly known as:  ANTARA Take 1 capsule (130 mg total) by mouth daily before breakfast.   furosemide 20 MG tablet Commonly known as:  LASIX Take 1 tablet (20 mg total) by mouth daily as needed.   HYDROcodone-acetaminophen 5-325 MG tablet Commonly known as:  NORCO/VICODIN Take 1 tablet by mouth every 6 (six) hours as needed for moderate pain.   ibuprofen 200 MG tablet Commonly known as:  ADVIL,MOTRIN Take 400 mg by mouth every 6 (six) hours as needed for mild pain.   olmesartan 20 MG tablet Commonly known as:  BENICAR Take 20 mg by mouth daily.   oxybutynin 5 MG tablet Commonly known as:  DITROPAN Take  1 tablet (5 mg total) by mouth at bedtime as needed (overactive bladder).   polyethylene glycol packet Commonly known as:  MiraLax Take 17 g by mouth daily.   rosuvastatin 5 MG tablet Commonly known as:  CRESTOR Take 5 mg by mouth at bedtime.   Vitamin D (Ergocalciferol) 1.25 MG (50000 UT) Caps capsule Commonly known as:  DRISDOL Take 50,000 Units by mouth every 7 (seven) days. Friday        Follow-up Information    Surgery,  [place] Follow up on 01/23/2019.   Specialty:  General Surgery Why:  01/23/19 @ 230pm. This will be a televisit due to the coronavirus. The PA will call you at the time of your appointment. Please email a photo of your incisions and name 2-3 days before your appointment to [email or web address] Contact information: [place] [address] [phone]           Signed: Alferd Apa, [facility] Surgery 01/08/2019, 10:21 AM Pager: [phone]  [Corona virus days] Agree with above.  Alphonsa Overall, MD, [facility] Surgery Pager: [phone] Office phone:  [phone]

## 2019-01-08 NOTE — Progress Notes (Signed)
Patient lost IV access. IV team consulted and notified RN that he attempted 3 times and that is the max amount of attempts he is allowed. Patient is taking oral fluids well and has advanced to a soft diet, tolerating well. RN will pass this info on to day RN to notify surgeon in AM. Will continue to monitor.

## 2019-01-08 NOTE — Progress Notes (Signed)
Writer explained discharge instructions to patient. Patient had no questions. Writer walked patient out to the front of the building to her husband

## 2019-01-08 NOTE — Discharge Instructions (Signed)
[facility] ° °Please arrive at least 30 min before your appointment to complete your check in paperwork.  If you are unable to arrive 30 min prior to your appointment time we may have to cancel or reschedule you. °LAPAROSCOPIC SURGERY: POST OP INSTRUCTIONS °Always review your discharge instruction sheet given to you by the facility where your surgery was performed. °IF YOU HAVE DISABILITY OR FAMILY LEAVE FORMS, YOU MUST BRING THEM TO THE OFFICE FOR PROCESSING.   °DO NOT GIVE THEM TO YOUR DOCTOR. ° °PAIN CONTROL ° °1. First take acetaminophen (Tylenol) AND/or ibuprofen (Advil) to control your pain after surgery.  Follow directions on package.  Taking acetaminophen (Tylenol) and/or ibuprofen (Advil) regularly after surgery will help to control your pain and lower the amount of prescription pain medication you may need.  You should not take more than 4,000 mg (4 grams) of acetaminophen (Tylenol) in 24 hours.  You should not take ibuprofen (Advil), aleve, motrin, naprosyn or other NSAIDS if you have a history of stomach ulcers or chronic kidney disease.  °2. A prescription for pain medication may be given to you upon discharge.  Take your pain medication as prescribed, if you still have uncontrolled pain after taking acetaminophen (Tylenol) or ibuprofen (Advil). °3. Use ice packs to help control pain. °4. If you need a refill on your pain medication, please contact your pharmacy.  They will contact our office to request authorization. Prescriptions will not be filled after 5pm or on week-ends. ° °HOME MEDICATIONS °5. Take your usually prescribed medications unless otherwise directed. ° °DIET °6. You should follow a light diet the first few days after arrival home.  Be sure to include lots of fluids daily. Avoid fatty, fried foods.  ° °CONSTIPATION °7. It is common to experience some constipation after surgery and if you are taking pain medication.  Increasing fluid intake and taking a stool  softener (such as Colace) will usually help or prevent this problem from occurring.  A mild laxative (Milk of Magnesia or Miralax) should be taken according to package instructions if there are no bowel movements after 48 hours. ° °WOUND/INCISION CARE °8. Most patients will experience some swelling and bruising in the area of the incisions.  Ice packs will help.  Swelling and bruising can take several days to resolve.  °9. Unless discharge instructions indicate otherwise, follow guidelines below  °a. STERI-STRIPS - you may remove your outer bandages 48 hours after surgery, and you may shower at that time.  You have steri-strips (small skin tapes) in place directly over the incision.  These strips should be left on the skin for 7-10 days.   °b. DERMABOND/SKIN GLUE - you may shower in 24 hours.  The glue will flake off over the next 2-3 weeks. °10. Any sutures or staples will be removed at the office during your follow-up visit. ° °ACTIVITIES °11. You may resume regular (light) daily activities beginning the next day--such as daily self-care, walking, climbing stairs--gradually increasing activities as tolerated.  You may have sexual intercourse when it is comfortable.  Refrain from any heavy lifting or straining until approved by your doctor. °a. You may drive when you are no longer taking prescription pain medication, you can comfortably wear a seatbelt, and you can safely maneuver your car and apply brakes. ° °FOLLOW-UP °12. You should see your doctor in the office for a follow-up appointment approximately 2-3 weeks after your surgery.  You should have been given your post-op/follow-up appointment when   your surgery was scheduled.  If you did not receive a post-op/follow-up appointment, make sure that you call for this appointment within a day or two after you arrive home to insure a convenient appointment time.   WHEN TO CALL YOUR DOCTOR: 1. Fever over 101.0 2. Inability to urinate 3. Continued bleeding from  incision. 4. Increased pain, redness, or drainage from the incision. 5. Increasing abdominal pain  The clinic staff is available to answer your questions during regular business hours.  Please dont hesitate to call and ask to speak to one of the nurses for clinical concerns.  If you have a medical emergency, go to the nearest emergency room or call 911.  A surgeon from [facility] is always on call at the hospital. [address], [place], [place], [place]  [address] ? [address], [place], [place]   [address] [phone] ? [phone] ? FAX [phone]    Managing Your Pain After Surgery Without Opioids    Thank you for participating in our program to help patients manage their pain after surgery without opioids. This is part of our effort to provide you with the best care possible, without exposing you or your family to the risk that opioids pose.  What pain can I expect after surgery? You can expect to have some pain after surgery. This is normal. The pain is typically worse the day after surgery, and quickly begins to get better. Many studies have found that many patients are able to manage their pain after surgery with Over-the-Counter (OTC) medications such as Tylenol and Motrin. If you have a condition that does not allow you to take Tylenol or Motrin, notify your surgical team.  How will I manage my pain? The best strategy for controlling your pain after surgery is around the clock pain control with Tylenol (acetaminophen) and Motrin (ibuprofen or Advil). Alternating these medications with each other allows you to maximize your pain control. In addition to Tylenol and Motrin, you can use heating pads or ice packs on your incisions to help reduce your pain.  How will I alternate your regular strength over-the-counter pain medication? You will take a dose of pain medication every three hours. ; Start by taking 650 mg of Tylenol (2 pills of 325  mg) ; 3 hours later take 600 mg of Motrin (3 pills of 200 mg) ; 3 hours after taking the Motrin take 650 mg of Tylenol ; 3 hours after that take 600 mg of Motrin.   - 1 -  See example - if your first dose of Tylenol is at 12:00 PM   12:00 PM Tylenol 650 mg (2 pills of 325 mg)  3:00 PM Motrin 600 mg (3 pills of 200 mg)  6:00 PM Tylenol 650 mg (2 pills of 325 mg)  9:00 PM Motrin 600 mg (3 pills of 200 mg)  Continue alternating every 3 hours   We recommend that you follow this schedule around-the-clock for at least 3 days after surgery, or until you feel that it is no longer needed. Use the table on the last page of this handout to keep track of the medications you are taking. Important: Do not take more than 3000mg  of Tylenol or 3200mg  of Motrin in a 24-hour period. Do not take ibuprofen/Motrin if you have a history of bleeding stomach ulcers, severe kidney disease, &/or actively taking a blood thinner  What if I still have pain? If you have pain that is not controlled  with the over-the-counter pain medications (Tylenol and Motrin or Advil) you might have what we call breakthrough pain. You will receive a prescription for a small amount of an opioid pain medication such as Oxycodone, Tramadol, or Tylenol with Codeine. Use these opioid pills in the first 24 hours after surgery if you have breakthrough pain. Do not take more than 1 pill every 4-6 hours.  If you still have uncontrolled pain after using all opioid pills, don't hesitate to call our staff using the number provided. We will help make sure you are managing your pain in the best way possible, and if necessary, we can provide a prescription for additional pain medication.   Day 1    Time  Name of Medication Number of pills taken  Amount of Acetaminophen  Pain Level   Comments  AM PM       AM PM       AM PM       AM PM       AM PM       AM PM       AM PM       AM PM       Total Daily amount of Acetaminophen Do not  take more than  3,000 mg per day      Day 2    Time  Name of Medication Number of pills taken  Amount of Acetaminophen  Pain Level   Comments  AM PM       AM PM       AM PM       AM PM       AM PM       AM PM       AM PM       AM PM       Total Daily amount of Acetaminophen Do not take more than  3,000 mg per day      Day 3    Time  Name of Medication Number of pills taken  Amount of Acetaminophen  Pain Level   Comments  AM PM       AM PM       AM PM       AM PM          AM PM       AM PM       AM PM       AM PM       Total Daily amount of Acetaminophen Do not take more than  3,000 mg per day      Day 4    Time  Name of Medication Number of pills taken  Amount of Acetaminophen  Pain Level   Comments  AM PM       AM PM       AM PM       AM PM       AM PM       AM PM       AM PM       AM PM       Total Daily amount of Acetaminophen Do not take more than  3,000 mg per day      Day 5    Time  Name of Medication Number of pills taken  Amount of Acetaminophen  Pain Level   Comments  AM PM       AM PM       AM PM  AM PM       AM PM       AM PM       AM PM       AM PM       Total Daily amount of Acetaminophen Do not take more than  3,000 mg per day       Day 6    Time  Name of Medication Number of pills taken  Amount of Acetaminophen  Pain Level  Comments  AM PM       AM PM       AM PM       AM PM       AM PM       AM PM       AM PM       AM PM       Total Daily amount of Acetaminophen Do not take more than  3,000 mg per day      Day 7    Time  Name of Medication Number of pills taken  Amount of Acetaminophen  Pain Level   Comments  AM PM       AM PM       AM PM       AM PM       AM PM       AM PM       AM PM       AM PM       Total Daily amount of Acetaminophen Do not take more than  3,000 mg per day        For additional information about how and where to safely dispose of unused  opioid medications - [employer]  Disclaimer: This document contains information and/or instructional materials adapted from [employer] for the typical patient with your condition. It does not replace medical advice from your health care provider because your experience may differ from that of the typical patient. Talk to your health care provider if you have any questions about this document, your condition or your treatment plan. Adapted from [employer] If you have a gallbladder condition, you may have trouble digesting fats. Eating a low-fat diet can help reduce your symptoms, and may be helpful before and after having surgery to remove your gallbladder (cholecystectomy). Your health care provider may recommend that you work with a diet and nutrition specialist (dietitian) to help you reduce the amount of fat in your diet. What are tips for following this plan? General guidelines  Limit your fat intake to less than 30% of your total daily calories. If you eat around 1,800 calories each day, this is less than 60 grams (g) of fat per day.  Fat is an important part of a healthy diet. Eating a low-fat diet can make it hard to maintain a healthy body weight. Ask your dietitian how much fat, calories, and other nutrients you need each day.  Eat small, frequent meals throughout the day instead of three large meals.  Drink at least 8-10 cups of fluid a day. Drink enough fluid to keep your urine clear or pale yellow.  Limit alcohol intake to no more than 1 drink a day for nonpregnant women and 2 drinks a day for men. One drink equals 12 oz of beer, 5 oz of wine, or 1 oz of hard liquor. Reading food labels  Check Nutrition Facts on food labels for the amount of fat per serving.  Choose foods with less than 3 grams of fat per serving. Shopping  Choose nonfat and low-fat healthy foods. Look for the words nonfat, low fat, or fat  free.  Avoid buying processed or prepackaged foods. Cooking  Cook using low-fat methods, such as baking, broiling, grilling, or boiling.  Cook with small amounts of healthy fats, such as olive oil, grapeseed oil, canola oil, or sunflower oil. What foods are recommended?   All fresh, frozen, or canned fruits and vegetables.  Whole grains.  Low-fat or non-fat (skim) milk and yogurt.  Lean meat, skinless poultry, fish, eggs, and beans.  Low-fat protein supplement powders or drinks.  Spices and herbs. What foods are not recommended?  High-fat foods. These include baked goods, fast food, fatty cuts of meat, ice cream, french toast, sweet rolls, pizza, cheese bread, foods covered with butter, creamy sauces, or cheese.  Fried foods. These include french fries, tempura, battered fish, breaded chicken, fried breads, and sweets.  Foods with strong odors.  Foods that cause bloating and gas. Summary  A low-fat diet can be helpful if you have a gallbladder condition, or before and after gallbladder surgery.  Limit your fat intake to less than 30% of your total daily calories. This is about 60 g of fat if you eat 1,800 calories each day.  Eat small, frequent meals throughout the day instead of three large meals. This information is not intended to replace advice given to you by your health care provider. Make sure you discuss any questions you have with your health care provider. Document Released: 10/02/2013 Document Revised: 11/04/2016 Document Reviewed: 11/04/2016 Elsevier Interactive Patient Education  2019 [employer].

## 2019-03-26 ENCOUNTER — Other Ambulatory Visit: Payer: 59

## 2019-04-03 ENCOUNTER — Ambulatory Visit: Payer: 59 | Admitting: Internal Medicine

## 2019-05-31 ENCOUNTER — Telehealth: Payer: Self-pay | Admitting: Internal Medicine

## 2019-05-31 ENCOUNTER — Other Ambulatory Visit: Payer: Self-pay | Admitting: Internal Medicine

## 2019-05-31 MED ORDER — COLESTIPOL HCL 1 G PO TABS: 2.0000 g | ORAL_TABLET | Freq: Every day | ORAL | 3 refills | Status: DC

## 2019-05-31 MED ORDER — OLMESARTAN MEDOXOMIL 20 MG PO TABS: 20.0000 mg | ORAL_TABLET | Freq: Every day | ORAL | 3 refills | Status: DC

## 2019-05-31 NOTE — Telephone Encounter (Signed)
Medication Refill - Medication: colestipol (COLESTID) 1 g tablet /olmesartan (BENICAR) 20 MG tablet  Has the patient contacted their pharmacy? Yes.   (Agent: If no, request that the patient contact the pharmacy for the refill.) (Agent: If yes, when and what did the pharmacy advise?)  Preferred Pharmacy (with phone number or street name):  [id number] - [name], [place] - [place] [phone] (Phone) [phone] (Fax)     Agent: Please be advised that RX refills may take up to 3 business days. We ask that you follow-up with your pharmacy.

## 2019-05-31 NOTE — Telephone Encounter (Signed)
Patient requesting refills on colestipol and olmesartan.   Both Rx were originally prescribed by a historical provider.

## 2019-06-05 ENCOUNTER — Other Ambulatory Visit: Payer: Self-pay

## 2019-06-05 ENCOUNTER — Other Ambulatory Visit (INDEPENDENT_AMBULATORY_CARE_PROVIDER_SITE_OTHER): Payer: 59

## 2019-06-05 DIAGNOSIS — I10 Essential (primary) hypertension: Secondary | ICD-10-CM | POA: Diagnosis not present

## 2019-06-05 DIAGNOSIS — Z1329 Encounter for screening for other suspected endocrine disorder: Secondary | ICD-10-CM

## 2019-06-05 DIAGNOSIS — Z13818 Encounter for screening for other digestive system disorders: Secondary | ICD-10-CM

## 2019-06-05 DIAGNOSIS — E559 Vitamin D deficiency, unspecified: Secondary | ICD-10-CM

## 2019-06-05 DIAGNOSIS — R946 Abnormal results of thyroid function studies: Secondary | ICD-10-CM

## 2019-06-05 DIAGNOSIS — R7303 Prediabetes: Secondary | ICD-10-CM

## 2019-06-05 DIAGNOSIS — Z1389 Encounter for screening for other disorder: Secondary | ICD-10-CM

## 2019-06-05 DIAGNOSIS — E538 Deficiency of other specified B group vitamins: Secondary | ICD-10-CM | POA: Diagnosis not present

## 2019-06-05 LAB — COMPREHENSIVE METABOLIC PANEL
ALT: 19 U/L (ref 0–35)
AST: 19 U/L (ref 0–37)
Albumin: 5.1 g/dL (ref 3.5–5.2)
Alkaline Phosphatase: 37 U/L — ABNORMAL LOW (ref 39–117)
BUN: 17 mg/dL (ref 6–23)
CO2: 27 mEq/L (ref 19–32)
Calcium: 10.5 mg/dL (ref 8.4–10.5)
Chloride: 105 mEq/L (ref 96–112)
Creatinine, Ser: 0.88 mg/dL (ref 0.40–1.20)
GFR: 63.79 mL/min (ref >60.00)
Glucose, Bld: 92 mg/dL (ref 70–99)
Potassium: 4.4 mEq/L (ref 3.5–5.1)
Sodium: 141 mEq/L (ref 135–145)
Total Bilirubin: 0.8 mg/dL (ref 0.2–1.2)
Total Protein: 7.5 g/dL (ref 6.0–8.3)

## 2019-06-05 LAB — CBC WITH DIFFERENTIAL/PLATELET
Basophils Absolute: 0 10*3/uL (ref 0.0–0.1)
Basophils Relative: 0.5 % (ref 0.0–3.0)
Eosinophils Absolute: 0.1 10*3/uL (ref 0.0–0.7)
Eosinophils Relative: 2.3 % (ref 0.0–5.0)
HCT: 41.3 % (ref 36.0–46.0)
Hemoglobin: 13.9 g/dL (ref 12.0–15.0)
Lymphocytes Relative: 45.7 % (ref 12.0–46.0)
Lymphs Abs: 2.4 10*3/uL (ref 0.7–4.0)
MCHC: 33.8 g/dL (ref 30.0–36.0)
MCV: 89.1 fl (ref 78.0–100.0)
Monocytes Absolute: 0.5 10*3/uL (ref 0.1–1.0)
Monocytes Relative: 9.2 % (ref 3.0–12.0)
Neutro Abs: 2.2 10*3/uL (ref 1.4–7.7)
Neutrophils Relative %: 42.3 % — ABNORMAL LOW (ref 43.0–77.0)
Platelets: 338 10*3/uL (ref 150.0–400.0)
RBC: 4.63 Mil/uL (ref 3.87–5.11)
RDW: 13.9 % (ref 11.5–15.5)
WBC: 5.2 10*3/uL (ref 4.0–10.5)

## 2019-06-05 LAB — TSH: TSH: 5.68 u[IU]/mL — ABNORMAL HIGH (ref 0.35–4.50)

## 2019-06-05 LAB — VITAMIN D 25 HYDROXY (VIT D DEFICIENCY, FRACTURES): VITD: 34.3 ng/mL (ref 30.00–100.00)

## 2019-06-05 LAB — LDL CHOLESTEROL, DIRECT: Direct LDL: 133 mg/dL

## 2019-06-05 LAB — VITAMIN B12: Vitamin B-12: 125 pg/mL — ABNORMAL LOW (ref 211–911)

## 2019-06-05 LAB — LIPID PANEL
Cholesterol: 217 mg/dL — ABNORMAL HIGH (ref 0–200)
HDL: 45.5 mg/dL (ref >39.00)
NonHDL: 171.71
Total CHOL/HDL Ratio: 5
Triglycerides: 206 mg/dL — ABNORMAL HIGH (ref 0.0–149.0)
VLDL: 41.2 mg/dL — ABNORMAL HIGH (ref 0.0–40.0)

## 2019-06-05 LAB — HEMOGLOBIN A1C: Hgb A1c MFr Bld: 5.5 % (ref 4.6–6.5)

## 2019-06-06 ENCOUNTER — Other Ambulatory Visit: Payer: Self-pay | Admitting: Internal Medicine

## 2019-06-06 ENCOUNTER — Other Ambulatory Visit (INDEPENDENT_AMBULATORY_CARE_PROVIDER_SITE_OTHER): Payer: 59

## 2019-06-06 DIAGNOSIS — R946 Abnormal results of thyroid function studies: Secondary | ICD-10-CM

## 2019-06-06 LAB — URINALYSIS, ROUTINE W REFLEX MICROSCOPIC
Bacteria, UA: NONE SEEN /HPF
Bilirubin Urine: NEGATIVE
Glucose, UA: NEGATIVE
Hgb urine dipstick: NEGATIVE
Hyaline Cast: NONE SEEN /LPF
Ketones, ur: NEGATIVE
Nitrite: NEGATIVE
Protein, ur: NEGATIVE
Specific Gravity, Urine: 1.016 (ref 1.001–1.03)
pH: <=5 (ref 5.0–8.0)

## 2019-06-06 LAB — T3, FREE: T3, Free: 3 pg/mL (ref 2.3–4.2)

## 2019-06-06 LAB — HEPATITIS C ANTIBODY
Hepatitis C Ab: NONREACTIVE
SIGNAL TO CUT-OFF: 0.01 (ref <1.00)

## 2019-06-06 LAB — T4, FREE: Free T4: 0.75 ng/dL (ref 0.60–1.60)

## 2019-06-06 NOTE — Addendum Note (Signed)
Addended by: Leeanne Rio on: 06/06/2019 12:31 PM   Modules accepted: Orders

## 2019-06-07 ENCOUNTER — Other Ambulatory Visit: Payer: Self-pay | Admitting: Internal Medicine

## 2019-06-07 DIAGNOSIS — R7989 Other specified abnormal findings of blood chemistry: Secondary | ICD-10-CM

## 2019-06-07 LAB — THYROID PEROXIDASE ANTIBODY: Thyroperoxidase Ab SerPl-aCnc: <1 IU/mL (ref <9)

## 2019-06-08 ENCOUNTER — Encounter: Payer: Self-pay | Admitting: *Deleted

## 2019-06-08 ENCOUNTER — Ambulatory Visit (INDEPENDENT_AMBULATORY_CARE_PROVIDER_SITE_OTHER): Payer: 59 | Admitting: Internal Medicine

## 2019-06-08 ENCOUNTER — Other Ambulatory Visit: Payer: Self-pay

## 2019-06-08 DIAGNOSIS — N3281 Overactive bladder: Secondary | ICD-10-CM

## 2019-06-08 DIAGNOSIS — E785 Hyperlipidemia, unspecified: Secondary | ICD-10-CM

## 2019-06-08 DIAGNOSIS — M797 Fibromyalgia: Secondary | ICD-10-CM

## 2019-06-08 DIAGNOSIS — E538 Deficiency of other specified B group vitamins: Secondary | ICD-10-CM

## 2019-06-08 DIAGNOSIS — R7989 Other specified abnormal findings of blood chemistry: Secondary | ICD-10-CM | POA: Insufficient documentation

## 2019-06-08 DIAGNOSIS — Z1231 Encounter for screening mammogram for malignant neoplasm of breast: Secondary | ICD-10-CM

## 2019-06-08 MED ORDER — FENOFIBRATE MICRONIZED 130 MG PO CAPS: 130.0000 mg | ORAL_CAPSULE | Freq: Every day | ORAL | 3 refills | Status: DC

## 2019-06-08 MED ORDER — MIRABEGRON ER 25 MG PO TB24: 25.0000 mg | ORAL_TABLET | Freq: Every day | ORAL | 3 refills | Status: DC

## 2019-06-08 MED ORDER — AMITRIPTYLINE HCL 25 MG PO TABS: 25.0000 mg | ORAL_TABLET | Freq: Every day | ORAL | 3 refills | Status: DC

## 2019-06-08 MED ORDER — ROSUVASTATIN CALCIUM 5 MG PO TABS: 5.0000 mg | ORAL_TABLET | Freq: Every day | ORAL | 3 refills | Status: DC

## 2019-06-08 NOTE — Patient Instructions (Signed)
Vitamin B12 Deficiency Vitamin B12 deficiency occurs when the body does not have enough vitamin B12, which is an important vitamin. The body needs this vitamin:  To make red blood cells.  To make DNA. This is the genetic material inside cells.  To help the nerves work properly so they can carry messages from the brain to the body. Vitamin B12 deficiency can cause various health problems, such as a low red blood cell count (anemia) or nerve damage. What are the causes? This condition may be caused by:  Not eating enough foods that contain vitamin B12.  Not having enough stomach acid and digestive fluids to properly absorb vitamin B12 from the food that you eat.  Certain digestive system diseases that make it hard to absorb vitamin B12. These diseases include Crohn's disease, chronic pancreatitis, and cystic fibrosis.  A condition in which the body does not make enough of a protein (intrinsic factor), resulting in too few red blood cells (pernicious anemia).  Having a surgery in which part of the stomach or small intestine is removed.  Taking certain medicines that make it hard for the body to absorb vitamin B12. These medicines include: ? Heartburn medicines (antacids and proton pump inhibitors). ? Certain antibiotic medicines. ? Some medicines that are used to treat diabetes, tuberculosis, gout, or high cholesterol. What increases the risk? The following factors may make you more likely to develop a B12 deficiency:  Being older than age 28.  Eating a vegetarian or vegan diet, especially while you are pregnant.  Eating a poor diet while you are pregnant.  Taking certain medicines.  Having alcoholism. What are the signs or symptoms? In some cases, there are no symptoms of this condition. If the condition leads to anemia or nerve damage, various symptoms can occur, such as:  Weakness.  Fatigue.  Loss of appetite.  Weight loss.  Numbness or tingling in your hands and  feet.  Redness and burning of the tongue.  Confusion or memory problems.  Depression.  Sensory problems, such as color blindness, ringing in the ears, or loss of taste.  Diarrhea or constipation.  Trouble walking. If anemia is severe, symptoms can include:  Shortness of breath.  Dizziness.  Rapid heart rate (tachycardia). How is this diagnosed? This condition may be diagnosed with a blood test to measure the level of vitamin B12 in your blood. You may also have other tests, including:  A group of tests that measure certain characteristics of blood cells (complete blood count, CBC).  A blood test to measure intrinsic factor.  A procedure where a thin tube with a camera on the end is used to look into your stomach or intestines (endoscopy). Other tests may be needed to discover the cause of B12 deficiency. How is this treated? Treatment for this condition depends on the cause. This condition may be treated by:  Changing your eating and drinking habits, such as: ? Eating more foods that contain vitamin B12. ? Drinking less alcohol or no alcohol.  Getting vitamin B12 injections.  Taking vitamin B12 supplements. Your health care provider will tell you which dosage is best for you. Follow these instructions at home: Eating and drinking   Eat lots of healthy foods that contain vitamin B12, including: ? Meats and poultry. This includes beef, pork, chicken, [place], and organ meats, such as liver. ? Seafood. This includes clams, rainbow trout, salmon, tuna, and haddock. ? Eggs. ? Cereal and dairy products that are fortified. This means that vitamin B12  has been added to the food. Check the label on the package to see if the food is fortified. The items listed above may not be a complete list of recommended foods and beverages. Contact a dietitian for more information. General instructions  Get any injections that are prescribed by your health care provider.  Take  supplements only as told by your health care provider. Follow the directions carefully.  Do not drink alcohol if your health care provider tells you not to. In some cases, you may only be asked to limit alcohol use.  Keep all follow-up visits as told by your health care provider. This is important. Contact a health care provider if:  Your symptoms come back. Get help right away if you:  Develop shortness of breath.  Have a rapid heart rate.  Have chest pain.  Become dizzy or lose consciousness. Summary  Vitamin B12 deficiency occurs when the body does not have enough vitamin B12.  The main causes of vitamin B12 deficiency include dietary deficiency, digestive diseases, pernicious anemia, and having a surgery in which part of the stomach or small intestine is removed.  In some cases, there are no symptoms of this condition. If the condition leads to anemia or nerve damage, various symptoms can occur, such as weakness, shortness of breath, and numbness.  Treatment may include getting vitamin B12 injections or taking vitamin B12 supplements. Eat lots of healthy foods that contain vitamin B12. This information is not intended to replace advice given to you by your health care provider. Make sure you discuss any questions you have with your health care provider. Document Released: 12/20/2011 Document Revised: 06/06/2018 Document Reviewed: 06/06/2018 Elsevier Patient Education  2020 [employer]  

## 2019-06-08 NOTE — Progress Notes (Signed)
Telephone Note  I connected with Beth Fisher  on 06/08/19 at  9:10 AM EDT by telephone and verified that I am speaking with the correct person using two identifiers.  Location patient: home Location provider:work or home office Persons participating in the virtual visit: patient, provider  I discussed the limitations of evaluation and management by telemedicine and the availability of in person appointments. The patient expressed understanding and agreed to proceed.   HPI: 1. B12 def 2. HLD on fenofibrate will CC Dr. Clayborn Bigness about repatha 3. Elevated tsh other labs normal will repeat in 6-8 weeks    ROS: See pertinent positives and negatives per HPI.  Past Medical History:  Diagnosis Date  . Arthritis   . Carotid stenosis    b/l w/o sx's   . Fibromyalgia   . Fibromyalgia   . Gastric ulcer    1970s  . GERD (gastroesophageal reflux disease)   . History of colon polyps   . History of UTI   . Hyperlipidemia   . Hypertension   . Inflammatory polyps of colon ([place])    08/2008 OK 08/2010 Ok  byk Dr Earlean Shawl  . Pneumonia 12/2011  . Prediabetes   . Ulcer   . UTI (urinary tract infection)   . Vitamin D deficiency     Past Surgical History:  Procedure Laterality Date  . CHOLECYSTECTOMY N/A 01/07/2019   Procedure: LAPAROSCOPIC CHOLECYSTECTOMY;  Surgeon: Ralene Ok, MD;  Location: [facility];  Service: General;  Laterality: N/A;  . HERNIA REPAIR  1978    Family History  Problem Relation Age of Onset  . Hyperlipidemia Mother   . Alzheimer's disease Mother   . Cancer Mother        breast cancer dx'ed 25 y.o   . Depression Mother   . Miscarriages / [place] Mother   . Diabetes Father   . Heart disease Father        CABG x 7   . Hyperlipidemia Father   . Mental illness Father   . Alzheimer's disease Father   . Depression Father   . Early death Father   . Cancer Paternal Grandmother        colon  . Diabetes Paternal Grandmother   . Heart disease Cousin        sudden  death less than 86 yrs old heart attack.  . Deep vein thrombosis Maternal Grandmother   . Cancer Maternal Grandfather        throat smoker    SOCIAL HX:  No guns, wears seat belt  Safe in relationship Married  2 kids (boy and girl) and 3 grands Retired Armed forces operational officer, some college    Current Outpatient Medications:  .  acetaminophen (TYLENOL) 325 MG tablet, Take 650 mg by mouth every 6 (six) hours as needed., Disp: , Rfl:  .  amitriptyline (ELAVIL) 25 MG tablet, Take 1 tablet (25 mg total) by mouth at bedtime., Disp: 90 tablet, Rfl: 3 .  colesevelam (WELCHOL) 625 MG tablet, Take 2 tablets (1,250 mg total) by mouth daily with breakfast., Disp: , Rfl:  .  colestipol (COLESTID) 1 g tablet, Take 2 tablets (2 g total) by mouth daily., Disp: 180 tablet, Rfl: 3 .  docusate sodium (COLACE) 100 MG capsule, Take 1 capsule (100 mg total) by mouth daily as needed., Disp: 30 capsule, Rfl: 2 .  famotidine (ACID REDUCER) 10 MG tablet, Take 10 mg by mouth daily as needed for heartburn. , Disp: , Rfl:  .  fenofibrate micronized (  ANTARA) 130 MG capsule, Take 1 capsule (130 mg total) by mouth daily before breakfast., Disp: 90 capsule, Rfl: 3 .  furosemide (LASIX) 20 MG tablet, Take 1 tablet (20 mg total) by mouth daily as needed., Disp: 30 tablet, Rfl:  .  HYDROcodone-acetaminophen (NORCO/VICODIN) 5-325 MG tablet, Take 1 tablet by mouth every 6 (six) hours as needed for moderate pain., Disp: 12 tablet, Rfl: 0 .  ibuprofen (ADVIL,MOTRIN) 200 MG tablet, Take 400 mg by mouth every 6 (six) hours as needed for mild pain., Disp: , Rfl:  .  mirabegron ER (MYRBETRIQ) 25 MG TB24 tablet, Take 1 tablet (25 mg total) by mouth daily., Disp: 90 tablet, Rfl: 3 .  olmesartan (BENICAR) 20 MG tablet, Take 1 tablet (20 mg total) by mouth daily., Disp: 90 tablet, Rfl: 3 .  polyethylene glycol (MIRALAX) packet, Take 17 g by mouth daily., Disp: 14 each, Rfl: 0 .  rosuvastatin (CRESTOR) 5 MG tablet, Take 1 tablet (5 mg total) by  mouth at bedtime., Disp: 90 tablet, Rfl: 3 .  Vitamin D, Ergocalciferol, (DRISDOL) 1.25 MG (50000 UT) CAPS capsule, Take 50,000 Units by mouth every 7 (seven) days. Friday, Disp: , Rfl:   EXAM: before failed audio  VITALS per patient if applicable:  GENERAL: alert, oriented, appears well and in no acute distress  HEENT: atraumatic, conjunttiva clear, no obvious abnormalities on inspection of external nose and ears  NECK: normal movements of the head and neck  LUNGS: on inspection no signs of respiratory distress, breathing rate appears normal, no obvious gross SOB, gasping or wheezing  CV: no obvious cyanosis  MS: moves all visible extremities without noticeable abnormality  PSYCH/NEURO: pleasant and cooperative, no obvious depression or anxiety, speech and thought processing grossly intact  ASSESSMENT AND PLAN:  Discussed the following assessment and plan:  B12 deficiency-sch B12 shots 1x per week x 4 weeks then q 30 days pt may decide if wants to do them at home  Overactive bladder - Plan: mirabegron ER (MYRBETRIQ) 25 MG TB24 tablet Dc oxybutynin due blurry vision  Hyperlipidemia, unspecified hyperlipidemia type - Plan: rosuvastatin (CRESTOR) 5 MG tablet, fenofibrate micronized (ANTARA) 130 MG capsule and on welchol not helping   Likely would benefit from repatha CC Dr. Clayborn Bigness   Fibromyalgia - Plan: amitriptyline (ELAVIL) 25 MG tablet  Elevated TSH -repeat TSH in 6-8 weeks if continues to be elevated check thyroid US   HM Flu shot will get at pharmacy, prevnar, shingrix utd  pna 23due in future if not had  rec Tdap  -faxed Tdap, pna 23 and flu shot to pharmacy    Pap negative 05/10/16 neg HPV  Mammogram [facility] get copy release signed had 09/29/18 negative ordered repeat   Colonoscopy 09/25/18 h/o polyps per pt get copy from Dr. Earlean Shawl in [place] 09/21/17 osteopenia rec vitamin D3 50K IU weekly then 5000 IU daily  Dermatology 06/2019 Dr. Kellie Moor     former PCP Dr. Candiss Norse  I discussed the assessment and treatment plan with the patient. The patient was provided an opportunity to ask questions and all were answered. The patient agreed with the plan and demonstrated an understanding of the instructions.   The patient was advised to call back or seek an in-person evaluation if the symptoms worsen or if the condition fails to improve as anticipated.  Time spent 20 minutes  Delorise Jackson, MD

## 2019-06-21 ENCOUNTER — Other Ambulatory Visit: Payer: Self-pay

## 2019-06-21 ENCOUNTER — Ambulatory Visit (INDEPENDENT_AMBULATORY_CARE_PROVIDER_SITE_OTHER): Payer: 59

## 2019-06-21 DIAGNOSIS — E538 Deficiency of other specified B group vitamins: Secondary | ICD-10-CM

## 2019-06-21 DIAGNOSIS — Z23 Encounter for immunization: Secondary | ICD-10-CM

## 2019-06-21 MED ORDER — CYANOCOBALAMIN 1000 MCG/ML IJ SOLN
1000.0000 ug | Freq: Once | INTRAMUSCULAR | Status: AC
  Administered 2019-06-21: 1000 ug via INTRAMUSCULAR

## 2019-06-27 NOTE — Progress Notes (Addendum)
Agree with injections [place]

## 2019-06-28 ENCOUNTER — Ambulatory Visit (INDEPENDENT_AMBULATORY_CARE_PROVIDER_SITE_OTHER): Payer: 59

## 2019-06-28 ENCOUNTER — Other Ambulatory Visit: Payer: Self-pay

## 2019-06-28 DIAGNOSIS — E538 Deficiency of other specified B group vitamins: Secondary | ICD-10-CM

## 2019-07-05 ENCOUNTER — Other Ambulatory Visit: Payer: Self-pay

## 2019-07-05 ENCOUNTER — Ambulatory Visit (INDEPENDENT_AMBULATORY_CARE_PROVIDER_SITE_OTHER): Payer: 59

## 2019-07-05 DIAGNOSIS — E538 Deficiency of other specified B group vitamins: Secondary | ICD-10-CM | POA: Diagnosis not present

## 2019-07-05 MED ORDER — CYANOCOBALAMIN 1000 MCG/ML IJ SOLN
1000.0000 ug | Freq: Once | INTRAMUSCULAR | Status: AC
  Administered 2019-07-05: 11:00:00 1000 ug via INTRAMUSCULAR

## 2019-07-05 NOTE — Progress Notes (Signed)
Patient presented today for B12 injection.  Administered IM in the left deltoid.  Patient tolerated well with no signs of distress.   

## 2019-07-12 ENCOUNTER — Other Ambulatory Visit: Payer: Self-pay

## 2019-07-12 ENCOUNTER — Ambulatory Visit (INDEPENDENT_AMBULATORY_CARE_PROVIDER_SITE_OTHER): Payer: 59

## 2019-07-12 DIAGNOSIS — E538 Deficiency of other specified B group vitamins: Secondary | ICD-10-CM

## 2019-07-12 MED ORDER — CYANOCOBALAMIN 1000 MCG/ML IJ SOLN
1000.0000 ug | Freq: Once | INTRAMUSCULAR | Status: AC
  Administered 2019-07-12: 1000 ug via INTRAMUSCULAR

## 2019-07-12 NOTE — Progress Notes (Signed)
Patient presented today for B12 injection.  Administered IM in the left deltoid.  Patient tolerated well with no signs of distress.   

## 2019-07-13 MED ORDER — CYANOCOBALAMIN 1000 MCG/ML IJ SOLN
1000.0000 ug | Freq: Once | INTRAMUSCULAR | Status: AC
  Administered 2019-06-28: 1000 ug via INTRAMUSCULAR

## 2019-07-13 NOTE — Progress Notes (Signed)
Patient presented today for B12 injection.  Administered IM in left deltoid.  Patient tolerated well with no signs of distress.   

## 2019-08-06 ENCOUNTER — Other Ambulatory Visit: Payer: Self-pay

## 2019-08-06 ENCOUNTER — Other Ambulatory Visit (INDEPENDENT_AMBULATORY_CARE_PROVIDER_SITE_OTHER): Payer: 59

## 2019-08-06 DIAGNOSIS — R7989 Other specified abnormal findings of blood chemistry: Secondary | ICD-10-CM

## 2019-08-06 LAB — TSH: TSH: 3.47 u[IU]/mL (ref 0.35–4.50)

## 2019-08-14 ENCOUNTER — Other Ambulatory Visit: Payer: Self-pay

## 2019-08-14 ENCOUNTER — Ambulatory Visit (INDEPENDENT_AMBULATORY_CARE_PROVIDER_SITE_OTHER): Payer: 59 | Admitting: *Deleted

## 2019-08-14 DIAGNOSIS — E538 Deficiency of other specified B group vitamins: Secondary | ICD-10-CM | POA: Diagnosis not present

## 2019-08-14 MED ORDER — CYANOCOBALAMIN 1000 MCG/ML IJ SOLN
1000.0000 ug | Freq: Once | INTRAMUSCULAR | Status: AC
  Administered 2019-08-14: 1000 ug via INTRAMUSCULAR

## 2019-08-14 NOTE — Progress Notes (Addendum)
Patient presented for B 12 injection to right deltoid, patient voiced no concerns nor showed any signs of distress during injection.  Agree  [place]

## 2019-09-18 ENCOUNTER — Ambulatory Visit (INDEPENDENT_AMBULATORY_CARE_PROVIDER_SITE_OTHER): Payer: 59

## 2019-09-18 ENCOUNTER — Other Ambulatory Visit: Payer: Self-pay

## 2019-09-18 DIAGNOSIS — E538 Deficiency of other specified B group vitamins: Secondary | ICD-10-CM | POA: Diagnosis not present

## 2019-09-18 MED ORDER — CYANOCOBALAMIN 1000 MCG/ML IJ SOLN
1000.0000 ug | Freq: Once | INTRAMUSCULAR | Status: AC
  Administered 2019-09-18: 09:00:00 1000 ug via INTRAMUSCULAR

## 2019-09-18 NOTE — Progress Notes (Addendum)
Patient presents today for b12 injection. Left deltoid, IM. Pt tolerated well.   Agree  [place]

## 2019-09-26 ENCOUNTER — Encounter: Payer: Self-pay | Admitting: Internal Medicine

## 2019-09-26 ENCOUNTER — Ambulatory Visit (INDEPENDENT_AMBULATORY_CARE_PROVIDER_SITE_OTHER): Payer: 59 | Admitting: Internal Medicine

## 2019-09-26 ENCOUNTER — Other Ambulatory Visit: Payer: Self-pay

## 2019-09-26 VITALS — Ht 66.0 in | Wt 196.0 lb

## 2019-09-26 DIAGNOSIS — I10 Essential (primary) hypertension: Secondary | ICD-10-CM | POA: Diagnosis not present

## 2019-09-26 DIAGNOSIS — R946 Abnormal results of thyroid function studies: Secondary | ICD-10-CM

## 2019-09-26 DIAGNOSIS — E785 Hyperlipidemia, unspecified: Secondary | ICD-10-CM | POA: Diagnosis not present

## 2019-09-26 DIAGNOSIS — Z1231 Encounter for screening mammogram for malignant neoplasm of breast: Secondary | ICD-10-CM | POA: Diagnosis not present

## 2019-09-26 DIAGNOSIS — E538 Deficiency of other specified B group vitamins: Secondary | ICD-10-CM

## 2019-09-26 DIAGNOSIS — N3281 Overactive bladder: Secondary | ICD-10-CM | POA: Diagnosis not present

## 2019-09-26 MED ORDER — MIRABEGRON ER 25 MG PO TB24: 25.0000 mg | ORAL_TABLET | Freq: Every day | ORAL | 3 refills | Status: DC

## 2019-09-26 NOTE — Progress Notes (Signed)
Telephone Note  I connected with Beth Fisher  on 09/26/19 at  9:30 AM EST telephone and verified that I am speaking with the correct person using two identifiers.  Location patient: home Location provider:work or home office Persons participating in the virtual visit: patient, provider  I discussed the limitations of evaluation and management by telemedicine and the availability of in person appointments. The patient expressed understanding and agreed to proceed.   HPI: 1. HLD on crestor 5 mg qhs, fenofibrate 130 mg qd  2. HTN BP 136/86 08/13/2019 cards appt she will work on reducing salt  3. Exposed to 4 y.o grandson briefly who tested + covid 4 days ago no sx's currently  4. Energy better with B12 shots and D3 4000 Iu qd  5. Elevated TSH improved 08/06/2019    ROS: See pertinent positives and negatives per HPI.  Past Medical History:  Diagnosis Date  . Arthritis   . Carotid stenosis    b/l w/o sx's   . Fibromyalgia   . Fibromyalgia   . Gastric ulcer    1970s  . GERD (gastroesophageal reflux disease)   . History of colon polyps   . History of UTI   . Hyperlipidemia   . Hypertension   . Inflammatory polyps of colon ([place])    08/2008 OK 08/2010 Ok  byk Dr Earlean Shawl  . Pneumonia 12/2011  . Prediabetes   . Ulcer   . UTI (urinary tract infection)   . Vitamin D deficiency     Past Surgical History:  Procedure Laterality Date  . CHOLECYSTECTOMY N/A 01/07/2019   Procedure: LAPAROSCOPIC CHOLECYSTECTOMY;  Surgeon: Ralene Ok, MD;  Location: [facility];  Service: General;  Laterality: N/A;  . HERNIA REPAIR  1978    Family History  Problem Relation Age of Onset  . Hyperlipidemia Mother   . Alzheimer's disease Mother   . Cancer Mother        breast cancer dx'ed 77 y.o   . Depression Mother   . Miscarriages / [place] Mother   . Diabetes Father   . Heart disease Father        CABG x 7   . Hyperlipidemia Father   . Mental illness Father   . Alzheimer's disease Father    . Depression Father   . Early death Father   . Cancer Paternal Grandmother        colon  . Diabetes Paternal Grandmother   . Heart disease Cousin        sudden death less than 38 yrs old heart attack.  . Deep vein thrombosis Maternal Grandmother   . Cancer Maternal Grandfather        throat smoker    SOCIAL HX:  No guns, wears seat belt  Safe in relationship Married  2 kids (boy and girl) and 3 grands Retired Armed forces operational officer, some college   Current Outpatient Medications:  .  acetaminophen (TYLENOL) 325 MG tablet, Take 650 mg by mouth every 6 (six) hours as needed., Disp: , Rfl:  .  amitriptyline (ELAVIL) 25 MG tablet, Take 1 tablet (25 mg total) by mouth at bedtime., Disp: 90 tablet, Rfl: 3 .  colestipol (COLESTID) 1 g tablet, Take 2 tablets (2 g total) by mouth daily., Disp: 180 tablet, Rfl: 3 .  docusate sodium (COLACE) 100 MG capsule, Take 1 capsule (100 mg total) by mouth daily as needed., Disp: 30 capsule, Rfl: 2 .  famotidine (ACID REDUCER) 10 MG tablet, Take 10 mg by mouth daily  as needed for heartburn. , Disp: , Rfl:  .  fenofibrate micronized (ANTARA) 130 MG capsule, Take 1 capsule (130 mg total) by mouth daily before breakfast., Disp: 90 capsule, Rfl: 3 .  furosemide (LASIX) 20 MG tablet, Take 1 tablet (20 mg total) by mouth daily as needed., Disp: 30 tablet, Rfl:  .  HYDROcodone-acetaminophen (NORCO/VICODIN) 5-325 MG tablet, Take 1 tablet by mouth every 6 (six) hours as needed for moderate pain., Disp: 12 tablet, Rfl: 0 .  ibuprofen (ADVIL,MOTRIN) 200 MG tablet, Take 400 mg by mouth every 6 (six) hours as needed for mild pain., Disp: , Rfl:  .  mirabegron ER (MYRBETRIQ) 25 MG TB24 tablet, Take 1 tablet (25 mg total) by mouth daily., Disp: 90 tablet, Rfl: 3 .  olmesartan (BENICAR) 20 MG tablet, Take 1 tablet (20 mg total) by mouth daily., Disp: 90 tablet, Rfl: 3 .  polyethylene glycol (MIRALAX) packet, Take 17 g by mouth daily., Disp: 14 each, Rfl: 0 .  rosuvastatin  (CRESTOR) 5 MG tablet, Take 1 tablet (5 mg total) by mouth at bedtime., Disp: 90 tablet, Rfl: 3 .  Vitamin D, Ergocalciferol, (DRISDOL) 1.25 MG (50000 UT) CAPS capsule, Take 50,000 Units by mouth every 7 (seven) days. Friday, Disp: , Rfl:   EXAM:  VITALS per patient if applicable:  GENERAL: alert, oriented, appears well and in no acute distress  PSYCH/NEURO: pleasant and cooperative, no obvious depression or anxiety, speech and thought processing grossly intact  ASSESSMENT AND PLAN:  Discussed the following assessment and plan:  Essential hypertension/HLD -cont meds  Consider repatha in future if cholesterol does not improve with cards Dr. Clayborn Bigness  Consider benicar inc 40 mg from 20 mg  Given info low sodium and HLD  Overactive bladder - Plan: mirabegron ER (MYRBETRIQ) 25 MG TB24 tablet  B12 deficiency -cont injections in office   HM Flu shot utd shingrix 2/2 prevnar utd pna 23 due in future if not had  rec Tdap   Pap negative 05/10/16 neg HPV will hold paps for now Mammogram [facility] get copy release signed had 09/29/18 negative ordered repeat pt to call to sch in 10/2019    Colonoscopy 09/25/18 h/o polyps per pt get copy from Dr. Earlean Shawl in [place] 09/21/17 osteopenia rec vitamin D3 50K IU weekly then 5000 IU daily  Dermatology 06/2019 Dr. Kellie Moor pt cancelled appt as of 09/27/2019  D3 4K Iu qd    former PCP Dr. Candiss Norse    -we discussed possible serious and likely etiologies, options for evaluation and workup, limitations of telemedicine visit vs in person visit, treatment, treatment risks and precautions. Pt prefers to treat via telemedicine empirically rather then risking or undertaking an in person visit at this moment. Patient agrees to seek prompt in person care if worsening, new symptoms arise, or if is not improving with treatment.   I discussed the assessment and treatment plan with the patient. The patient was provided an opportunity to ask questions and  all were answered. The patient agreed with the plan and demonstrated an understanding of the instructions.   The patient was advised to call back or seek an in-person evaluation if the symptoms worsen or if the condition fails to improve as anticipated.  Time spent 20 minutes  Delorise Jackson, MD

## 2019-09-26 NOTE — Patient Instructions (Addendum)
Hypertension, Adult High blood pressure (hypertension) is when the force of blood pumping through the arteries is too strong. The arteries are the blood vessels that carry blood from the heart throughout the body. Hypertension forces the heart to work harder to pump blood and may cause arteries to become narrow or stiff. Untreated or uncontrolled hypertension can cause a heart attack, heart failure, a stroke, kidney disease, and other problems. A blood pressure reading consists of a higher number over a lower number. Ideally, your blood pressure should be below 120/80. The first ("top") number is called the systolic pressure. It is a measure of the pressure in your arteries as your heart beats. The second ("bottom") number is called the diastolic pressure. It is a measure of the pressure in your arteries as the heart relaxes. What are the causes? The exact cause of this condition is not known. There are some conditions that result in or are related to high blood pressure. What increases the risk? Some risk factors for high blood pressure are under your control. The following factors may make you more likely to develop this condition:  Smoking.  Having type 2 diabetes mellitus, high cholesterol, or both.  Not getting enough exercise or physical activity.  Being overweight.  Having too much fat, sugar, calories, or salt (sodium) in your diet.  Drinking too much alcohol. Some risk factors for high blood pressure may be difficult or impossible to change. Some of these factors include:  Having chronic kidney disease.  Having a family history of high blood pressure.  Age. Risk increases with age.  Race. You may be at higher risk if you are African American.  Gender. Men are at higher risk than women before age 27. After age 17, women are at higher risk than men.  Having obstructive sleep apnea.  Stress. What are the signs or symptoms? High blood pressure may not cause symptoms. Very high  blood pressure (hypertensive crisis) may cause:  Headache.  Anxiety.  Shortness of breath.  Nosebleed.  Nausea and vomiting.  Vision changes.  Severe chest pain.  Seizures. How is this diagnosed? This condition is diagnosed by measuring your blood pressure while you are seated, with your arm resting on a flat surface, your legs uncrossed, and your feet flat on the floor. The cuff of the blood pressure monitor will be placed directly against the skin of your upper arm at the level of your heart. It should be measured at least twice using the same arm. Certain conditions can cause a difference in blood pressure between your right and left arms. Certain factors can cause blood pressure readings to be lower or higher than normal for a short period of time:  When your blood pressure is higher when you are in a health care provider's office than when you are at home, this is called white coat hypertension. Most people with this condition do not need medicines.  When your blood pressure is higher at home than when you are in a health care provider's office, this is called masked hypertension. Most people with this condition may need medicines to control blood pressure. If you have a high blood pressure reading during one visit or you have normal blood pressure with other risk factors, you may be asked to:  Return on a different day to have your blood pressure checked again.  Monitor your blood pressure at home for 1 week or longer. If you are diagnosed with hypertension, you may have other blood or  imaging tests to help your health care provider understand your overall risk for other conditions. How is this treated? This condition is treated by making healthy lifestyle changes, such as eating healthy foods, exercising more, and reducing your alcohol intake. Your health care provider may prescribe medicine if lifestyle changes are not enough to get your blood pressure under control, and  if:  Your systolic blood pressure is above 130.  Your diastolic blood pressure is above 80. Your personal target blood pressure may vary depending on your medical conditions, your age, and other factors. Follow these instructions at home: Eating and drinking   Eat a diet that is high in fiber and potassium, and low in sodium, added sugar, and fat. An example eating plan is called the DASH (Dietary Approaches to Stop Hypertension) diet. To eat this way: ? Eat plenty of fresh fruits and vegetables. Try to fill one half of your plate at each meal with fruits and vegetables. ? Eat whole grains, such as whole-wheat pasta, brown [place], or whole-grain bread. Fill about one fourth of your plate with whole grains. ? Eat or drink low-fat dairy products, such as skim milk or low-fat yogurt. ? Avoid fatty cuts of meat, processed or cured meats, and poultry with skin. Fill about one fourth of your plate with lean proteins, such as fish, chicken without skin, beans, eggs, or tofu. ? Avoid pre-made and processed foods. These tend to be higher in sodium, added sugar, and fat.  Reduce your daily sodium intake. Most people with hypertension should eat less than 1,500 mg of sodium a day.  Do not drink alcohol if: ? Your health care provider tells you not to drink. ? You are pregnant, may be pregnant, or are planning to become pregnant.  If you drink alcohol: ? Limit how much you use to:  0-1 drink a day for women.  0-2 drinks a day for men. ? Be aware of how much alcohol is in your drink. In the [place] one drink equals one 12 oz bottle of beer (355 mL), one 5 oz glass of wine (148 mL), or one 1 oz glass of hard liquor (44 mL). Lifestyle   Work with your health care provider to maintain a healthy body weight or to lose weight. Ask what an ideal weight is for you.  Get at least 30 minutes of exercise most days of the week. Activities may include walking, swimming, or biking.  Include exercise to  strengthen your muscles (resistance exercise), such as Pilates or lifting weights, as part of your weekly exercise routine. Try to do these types of exercises for 30 minutes at least 3 days a week.  Do not use any products that contain nicotine or tobacco, such as cigarettes, e-cigarettes, and chewing tobacco. If you need help quitting, ask your health care provider.  Monitor your blood pressure at home as told by your health care provider.  Keep all follow-up visits as told by your health care provider. This is important. Medicines  Take over-the-counter and prescription medicines only as told by your health care provider. Follow directions carefully. Blood pressure medicines must be taken as prescribed.  Do not skip doses of blood pressure medicine. Doing this puts you at risk for problems and can make the medicine less effective.  Ask your health care provider about side effects or reactions to medicines that you should watch for. Contact a health care provider if you:  Think you are having a reaction to a medicine you  are taking.  Have headaches that keep coming back (recurring).  Feel dizzy.  Have swelling in your ankles.  Have trouble with your vision. Get help right away if you:  Develop a severe headache or confusion.  Have unusual weakness or numbness.  Feel faint.  Have severe pain in your chest or abdomen.  Vomit repeatedly.  Have trouble breathing. Summary  Hypertension is when the force of blood pumping through your arteries is too strong. If this condition is not controlled, it may put you at risk for serious complications.  Your personal target blood pressure may vary depending on your medical conditions, your age, and other factors. For most people, a normal blood pressure is less than 120/80.  Hypertension is treated with lifestyle changes, medicines, or a combination of both. Lifestyle changes include losing weight, eating a healthy, low-sodium diet,  exercising more, and limiting alcohol. This information is not intended to replace advice given to you by your health care provider. Make sure you discuss any questions you have with your health care provider. Document Released: 09/27/2005 Document Revised: 06/07/2018 Document Reviewed: 06/07/2018 Elsevier Patient Education  [place].  Low-Sodium Eating Plan Sodium, which is an element that makes up salt, helps you maintain a healthy balance of fluids in your body. Too much sodium can increase your blood pressure and cause fluid and waste to be held in your body. Your health care provider or dietitian may recommend following this plan if you have high blood pressure (hypertension), kidney disease, liver disease, or heart failure. Eating less sodium can help lower your blood pressure, reduce swelling, and protect your heart, liver, and kidneys. What are tips for following this plan? General guidelines  Most people on this plan should limit their sodium intake to 1,500-2,000 mg (milligrams) of sodium each day. Reading food labels   The Nutrition Facts label lists the amount of sodium in one serving of the food. If you eat more than one serving, you must multiply the listed amount of sodium by the number of servings.  Choose foods with less than 140 mg of sodium per serving.  Avoid foods with 300 mg of sodium or more per serving. Shopping  Look for lower-sodium products, often labeled as "low-sodium" or "no salt added."  Always check the sodium content even if foods are labeled as "unsalted" or "no salt added".  Buy fresh foods. ? Avoid canned foods and premade or frozen meals. ? Avoid canned, cured, or processed meats  Buy breads that have less than 80 mg of sodium per slice. Cooking  Eat more home-cooked food and less restaurant, buffet, and fast food.  Avoid adding salt when cooking. Use salt-free seasonings or herbs instead of table salt or sea salt. Check with your  health care provider or pharmacist before using salt substitutes.  Cook with plant-based oils, such as canola, sunflower, or olive oil. Meal planning  When eating at a restaurant, ask that your food be prepared with less salt or no salt, if possible.  Avoid foods that contain MSG (monosodium glutamate). MSG is sometimes added to [place] food, bouillon, and some canned foods. What foods are recommended? The items listed may not be a complete list. Talk with your dietitian about what dietary choices are best for you. Grains Low-sodium cereals, including oats, puffed wheat and [place], and shredded wheat. Low-sodium crackers. Unsalted [place]. Unsalted pasta. Low-sodium bread. Whole-grain breads and whole-grain pasta. Vegetables Fresh or frozen vegetables. "No salt added" canned vegetables. "No salt added"  tomato sauce and paste. Low-sodium or reduced-sodium tomato and vegetable juice. Fruits Fresh, frozen, or canned fruit. Fruit juice. Meats and other protein foods Fresh or frozen (no salt added) meat, poultry, seafood, and fish. Low-sodium canned tuna and salmon. Unsalted nuts. Dried peas, beans, and lentils without added salt. Unsalted canned beans. Eggs. Unsalted nut butters. Dairy Milk. Soy milk. Cheese that is naturally low in sodium, such as ricotta cheese, fresh mozzarella, or Swiss cheese Low-sodium or reduced-sodium cheese. Cream cheese. Yogurt. Fats and oils Unsalted butter. Unsalted margarine with no trans fat. Vegetable oils such as canola or olive oils. Seasonings and other foods Fresh and dried herbs and spices. Salt-free seasonings. Low-sodium mustard and ketchup. Sodium-free salad dressing. Sodium-free light mayonnaise. Fresh or refrigerated horseradish. Lemon juice. Vinegar. Homemade, reduced-sodium, or low-sodium soups. Unsalted popcorn and pretzels. Low-salt or salt-free chips. What foods are not recommended? The items listed may not be a complete list. Talk with your dietitian  about what dietary choices are best for you. Grains Instant hot cereals. Bread stuffing, pancake, and biscuit mixes. Croutons. Seasoned [place] or pasta mixes. Noodle soup cups. Boxed or frozen macaroni and cheese. Regular salted crackers. Self-rising flour. Vegetables Sauerkraut, pickled vegetables, and relishes. Olives. [place] fries. Onion rings. Regular canned vegetables (not low-sodium or reduced-sodium). Regular canned tomato sauce and paste (not low-sodium or reduced-sodium). Regular tomato and vegetable juice (not low-sodium or reduced-sodium). Frozen vegetables in sauces. Meats and other protein foods Meat or fish that is salted, canned, smoked, spiced, or pickled. Bacon, ham, sausage, hotdogs, corned beef, chipped beef, packaged lunch meats, salt pork, jerky, pickled herring, anchovies, regular canned tuna, sardines, salted nuts. Dairy Processed cheese and cheese spreads. Cheese curds. Blue cheese. Feta cheese. String cheese. Regular cottage cheese. Buttermilk. Canned milk. Fats and oils Salted butter. Regular margarine. Ghee. Bacon fat. Seasonings and other foods Onion salt, garlic salt, seasoned salt, table salt, and sea salt. Canned and packaged gravies. Worcestershire sauce. Tartar sauce. Barbecue sauce. Teriyaki sauce. Soy sauce, including reduced-sodium. Steak sauce. Fish sauce. Oyster sauce. Cocktail sauce. Horseradish that you find on the shelf. Regular ketchup and mustard. Meat flavorings and tenderizers. Bouillon cubes. Hot sauce and Tabasco sauce. Premade or packaged marinades. Premade or packaged taco seasonings. Relishes. Regular salad dressings. Salsa. Potato and tortilla chips. Corn chips and puffs. Salted popcorn and pretzels. Canned or dried soups. Pizza. Frozen entrees and pot pies. Summary  Eating less sodium can help lower your blood pressure, reduce swelling, and protect your heart, liver, and kidneys.  Most people on this plan should limit their sodium intake to  1,500-2,000 mg (milligrams) of sodium each day.  Canned, boxed, and frozen foods are high in sodium. Restaurant foods, fast foods, and pizza are also very high in sodium. You also get sodium by adding salt to food.  Try to cook at home, eat more fresh fruits and vegetables, and eat less fast food, canned, processed, or prepared foods. This information is not intended to replace advice given to you by your health care provider. Make sure you discuss any questions you have with your health care provider. Document Released: 03/19/2002 Document Revised: 09/09/2017 Document Reviewed: 09/20/2016 Elsevier Patient Education  2020 [place] DASH stands for "Dietary Approaches to Stop Hypertension." The DASH eating plan is a healthy eating plan that has been shown to reduce high blood pressure (hypertension). It may also reduce your risk for type 2 diabetes, heart disease, and stroke. The DASH eating plan may also help with  weight loss. What are tips for following this plan?  General guidelines  Avoid eating more than 2,300 mg (milligrams) of salt (sodium) a day. If you have hypertension, you may need to reduce your sodium intake to 1,500 mg a day.  Limit alcohol intake to no more than 1 drink a day for nonpregnant women and 2 drinks a day for men. One drink equals 12 oz of beer, 5 oz of wine, or 1 oz of hard liquor.  Work with your health care provider to maintain a healthy body weight or to lose weight. Ask what an ideal weight is for you.  Get at least 30 minutes of exercise that causes your heart to beat faster (aerobic exercise) most days of the week. Activities may include walking, swimming, or biking.  Work with your health care provider or diet and nutrition specialist (dietitian) to adjust your eating plan to your individual calorie needs. Reading food labels   Check food labels for the amount of sodium per serving. Choose foods with less than 5 percent of the Daily  Value of sodium. Generally, foods with less than 300 mg of sodium per serving fit into this eating plan.  To find whole grains, look for the word "whole" as the first word in the ingredient list. Shopping  Buy products labeled as "low-sodium" or "no salt added."  Buy fresh foods. Avoid canned foods and premade or frozen meals. Cooking  Avoid adding salt when cooking. Use salt-free seasonings or herbs instead of table salt or sea salt. Check with your health care provider or pharmacist before using salt substitutes.  Do not fry foods. Cook foods using healthy methods such as baking, boiling, grilling, and broiling instead.  Cook with heart-healthy oils, such as olive, canola, soybean, or sunflower oil. Meal planning  Eat a balanced diet that includes: ? 5 or more servings of fruits and vegetables each day. At each meal, try to fill half of your plate with fruits and vegetables. ? Up to 6-8 servings of whole grains each day. ? Less than 6 oz of lean meat, poultry, or fish each day. A 3-oz serving of meat is about the same size as a deck of cards. One egg equals 1 oz. ? 2 servings of low-fat dairy each day. ? A serving of nuts, seeds, or beans 5 times each week. ? Heart-healthy fats. Healthy fats called Omega-3 fatty acids are found in foods such as flaxseeds and coldwater fish, like sardines, salmon, and mackerel.  Limit how much you eat of the following: ? Canned or prepackaged foods. ? Food that is high in trans fat, such as fried foods. ? Food that is high in saturated fat, such as fatty meat. ? Sweets, desserts, sugary drinks, and other foods with added sugar. ? Full-fat dairy products.  Do not salt foods before eating.  Try to eat at least 2 vegetarian meals each week.  Eat more home-cooked food and less restaurant, buffet, and fast food.  When eating at a restaurant, ask that your food be prepared with less salt or no salt, if possible. What foods are recommended? The  items listed may not be a complete list. Talk with your dietitian about what dietary choices are best for you. Grains Whole-grain or whole-wheat bread. Whole-grain or whole-wheat pasta. Brown [place]. [place]. Bulgur. Whole-grain and low-sodium cereals. Pita bread. Low-fat, low-sodium crackers. Whole-wheat flour tortillas. Vegetables Fresh or frozen vegetables (raw, steamed, roasted, or grilled). Low-sodium or reduced-sodium tomato and vegetable juice. Low-sodium  or reduced-sodium tomato sauce and tomato paste. Low-sodium or reduced-sodium canned vegetables. Fruits All fresh, dried, or frozen fruit. Canned fruit in natural juice (without added sugar). Meat and other protein foods Skinless chicken or [place]. Ground chicken or [place]. Pork with fat trimmed off. Fish and seafood. Egg whites. Dried beans, peas, or lentils. Unsalted nuts, nut butters, and seeds. Unsalted canned beans. Lean cuts of beef with fat trimmed off. Low-sodium, lean deli meat. Dairy Low-fat (1%) or fat-free (skim) milk. Fat-free, low-fat, or reduced-fat cheeses. Nonfat, low-sodium ricotta or cottage cheese. Low-fat or nonfat yogurt. Low-fat, low-sodium cheese. Fats and oils Soft margarine without trans fats. Vegetable oil. Low-fat, reduced-fat, or light mayonnaise and salad dressings (reduced-sodium). Canola, safflower, olive, soybean, and sunflower oils. Avocado. Seasoning and other foods Herbs. Spices. Seasoning mixes without salt. Unsalted popcorn and pretzels. Fat-free sweets. What foods are not recommended? The items listed may not be a complete list. Talk with your dietitian about what dietary choices are best for you. Grains Baked goods made with fat, such as croissants, muffins, or some breads. Dry pasta or [place] meal packs. Vegetables Creamed or fried vegetables. Vegetables in a cheese sauce. Regular canned vegetables (not low-sodium or reduced-sodium). Regular canned tomato sauce and paste (not low-sodium or  reduced-sodium). Regular tomato and vegetable juice (not low-sodium or reduced-sodium). Angie Fava. Olives. Fruits Canned fruit in a light or heavy syrup. Fried fruit. Fruit in cream or butter sauce. Meat and other protein foods Fatty cuts of meat. Ribs. Fried meat. [place] Salines. Sausage. Bologna and other processed lunch meats. Salami. Fatback. Hotdogs. Bratwurst. Salted nuts and seeds. Canned beans with added salt. Canned or smoked fish. Whole eggs or egg yolks. Chicken or [place] with skin. Dairy Whole or 2% milk, cream, and half-and-half. Whole or full-fat cream cheese. Whole-fat or sweetened yogurt. Full-fat cheese. Nondairy creamers. Whipped toppings. Processed cheese and cheese spreads. Fats and oils Butter. Stick margarine. Lard. Shortening. Ghee. Bacon fat. Tropical oils, such as coconut, palm kernel, or palm oil. Seasoning and other foods Salted popcorn and pretzels. Onion salt, garlic salt, seasoned salt, table salt, and sea salt. Worcestershire sauce. Tartar sauce. Barbecue sauce. Teriyaki sauce. Soy sauce, including reduced-sodium. Steak sauce. Canned and packaged gravies. Fish sauce. Oyster sauce. Cocktail sauce. Horseradish that you find on the shelf. Ketchup. Mustard. Meat flavorings and tenderizers. Bouillon cubes. Hot sauce and Tabasco sauce. Premade or packaged marinades. Premade or packaged taco seasonings. Relishes. Regular salad dressings. Where to find more information:  National Heart, Lung, and [place]: https://wilson-eaton.com/  American Heart Association: www.heart.org Summary  The DASH eating plan is a healthy eating plan that has been shown to reduce high blood pressure (hypertension). It may also reduce your risk for type 2 diabetes, heart disease, and stroke.  With the DASH eating plan, you should limit salt (sodium) intake to 2,300 mg a day. If you have hypertension, you may need to reduce your sodium intake to 1,500 mg a day.  When on the DASH eating plan, aim to eat more  fresh fruits and vegetables, whole grains, lean proteins, low-fat dairy, and heart-healthy fats.  Work with your health care provider or diet and nutrition specialist (dietitian) to adjust your eating plan to your individual calorie needs. This information is not intended to replace advice given to you by your health care provider. Make sure you discuss any questions you have with your health care provider. Document Released: 09/16/2011 Document Revised: 09/09/2017 Document Reviewed: 09/20/2016 Elsevier Patient Education  2020 [employer].  Cholesterol Content in Foods Cholesterol is a waxy, fat-like substance that helps to carry fat in the blood. The body needs cholesterol in small amounts, but too much cholesterol can cause damage to the arteries and heart. Most people should eat less than 200 milligrams (mg) of cholesterol a day. Foods with cholesterol  Cholesterol is found in animal-based foods, such as meat, seafood, and dairy. Generally, low-fat dairy and lean meats have less cholesterol than full-fat dairy and fatty meats. The milligrams of cholesterol per serving (mg per serving) of common cholesterol-containing foods are listed below. Meat and other proteins  Egg -- one large whole egg has 186 mg.  Veal shank -- 4 oz has 141 mg.  Lean ground [place] (93% lean) -- 4 oz has 118 mg.  Fat-trimmed lamb loin -- 4 oz has 106 mg.  Lean ground beef (90% lean) -- 4 oz has 100 mg.  Lobster -- 3.5 oz has 90 mg.  Pork loin chops -- 4 oz has 86 mg.  Canned salmon -- 3.5 oz has 83 mg.  Fat-trimmed beef top loin -- 4 oz has 78 mg.  Frankfurter -- 1 frank (3.5 oz) has 77 mg.  Crab -- 3.5 oz has 71 mg.  Roasted chicken without skin, white meat -- 4 oz has 66 mg.  Light bologna -- 2 oz has 45 mg.  Deli-cut [place] -- 2 oz has 31 mg.  Canned tuna -- 3.5 oz has 31 mg.  [place] Salines -- 1 oz has 29 mg.  Oysters and mussels (raw) -- 3.5 oz has 25 mg.  Mackerel -- 1 oz has 22  mg.  Trout -- 1 oz has 20 mg.  Pork sausage -- 1 link (1 oz) has 17 mg.  Salmon -- 1 oz has 16 mg.  Tilapia -- 1 oz has 14 mg. Dairy  Soft-serve ice cream --  cup (4 oz) has 103 mg.  Whole-milk yogurt -- 1 cup (8 oz) has 29 mg.  Cheddar cheese -- 1 oz has 28 mg.  American cheese -- 1 oz has 28 mg.  Whole milk -- 1 cup (8 oz) has 23 mg.  2% milk -- 1 cup (8 oz) has 18 mg.  Cream cheese -- 1 tablespoon (Tbsp) has 15 mg.  Cottage cheese --  cup (4 oz) has 14 mg.  Low-fat (1%) milk -- 1 cup (8 oz) has 10 mg.  Sour cream -- 1 Tbsp has 8.5 mg.  Low-fat yogurt -- 1 cup (8 oz) has 8 mg.  Nonfat Greek yogurt -- 1 cup (8 oz) has 7 mg.  Half-and-half cream -- 1 Tbsp has 5 mg. Fats and oils  Cod liver oil -- 1 tablespoon (Tbsp) has 82 mg.  Butter -- 1 Tbsp has 15 mg.  Lard -- 1 Tbsp has 14 mg.  Bacon grease -- 1 Tbsp has 14 mg.  Mayonnaise -- 1 Tbsp has 5-10 mg.  Margarine -- 1 Tbsp has 3-10 mg. Exact amounts of cholesterol in these foods may vary depending on specific ingredients and brands. Foods without cholesterol Most plant-based foods do not have cholesterol unless you combine them with a food that has cholesterol. Foods without cholesterol include:  Grains and cereals.  Vegetables.  Fruits.  Vegetable oils, such as olive, canola, and sunflower oil.  Legumes, such as peas, beans, and lentils.  Nuts and seeds.  Egg whites. Summary  The body needs cholesterol in small amounts, but too much cholesterol can cause damage to the arteries and heart.  Most people should eat less than 200 milligrams (mg) of cholesterol a day. This information is not intended to replace advice given to you by your health care provider. Make sure you discuss any questions you have with your health care provider. Document Released: 05/24/2017 Document Revised: 09/09/2017 Document Reviewed: 05/24/2017 Elsevier Patient Education  [place].    High  Cholesterol  High cholesterol is a condition in which the blood has high levels of a white, waxy, fat-like substance (cholesterol). The human body needs small amounts of cholesterol. The liver makes all the cholesterol that the body needs. Extra (excess) cholesterol comes from the food that we eat. Cholesterol is carried from the liver by the blood through the blood vessels. If you have high cholesterol, deposits (plaques) may build up on the walls of your blood vessels (arteries). Plaques make the arteries narrower and stiffer. Cholesterol plaques increase your risk for heart attack and stroke. Work with your health care provider to keep your cholesterol levels in a healthy range. What increases the risk? This condition is more likely to develop in people who:  Eat foods that are high in animal fat (saturated fat) or cholesterol.  Are overweight.  Are not getting enough exercise.  Have a family history of high cholesterol. What are the signs or symptoms? There are no symptoms of this condition. How is this diagnosed? This condition may be diagnosed from the results of a blood test.  If you are older than age 49, your health care provider may check your cholesterol every 4-6 years.  You may be checked more often if you already have high cholesterol or other risk factors for heart disease. The blood test for cholesterol measures:  "Bad" cholesterol (LDL cholesterol). This is the main type of cholesterol that causes heart disease. The desired level for LDL is less than 100.  "Good" cholesterol (HDL cholesterol). This type helps to protect against heart disease by cleaning the arteries and carrying the LDL away. The desired level for HDL is 60 or higher.  Triglycerides. These are fats that the body can store or burn for energy. The desired number for triglycerides is lower than 150.  Total cholesterol. This is a measure of the total amount of cholesterol in your blood, including LDL  cholesterol, HDL cholesterol, and triglycerides. A healthy number is less than 200. How is this treated? This condition is treated with diet changes, lifestyle changes, and medicines. Diet changes  This may include eating more whole grains, fruits, vegetables, nuts, and fish.  This may also include cutting back on red meat and foods that have a lot of added sugar. Lifestyle changes  Changes may include getting at least 40 minutes of aerobic exercise 3 times a week. Aerobic exercises include walking, biking, and swimming. Aerobic exercise along with a healthy diet can help you maintain a healthy weight.  Changes may also include quitting smoking. Medicines  Medicines are usually given if diet and lifestyle changes have failed to reduce your cholesterol to healthy levels.  Your health care provider may prescribe a statin medicine. Statin medicines have been shown to reduce cholesterol, which can reduce the risk of heart disease. Follow these instructions at home: Eating and drinking If told by your health care provider:  Eat chicken (without skin), fish, veal, shellfish, ground [place] breast, and round or loin cuts of red meat.  Do not eat fried foods or fatty meats, such as hot dogs and salami.  Eat plenty of fruits, such as  apples.  Eat plenty of vegetables, such as broccoli, potatoes, and carrots.  Eat beans, peas, and lentils.  Eat grains such as barley, [place], couscous, and bulgur wheat.  Eat pasta without cream sauces.  Use skim or nonfat milk, and eat low-fat or nonfat yogurt and cheeses.  Do not eat or drink whole milk, cream, ice cream, egg yolks, or hard cheeses.  Do not eat stick margarine or tub margarines that contain trans fats (also called partially hydrogenated oils).  Do not eat saturated tropical oils, such as coconut oil and palm oil.  Do not eat cakes, cookies, crackers, or other baked goods that contain trans fats.  General instructions  Exercise as  directed by your health care provider. Increase your activity level with activities such as gardening, walking, and taking the stairs.  Take over-the-counter and prescription medicines only as told by your health care provider.  Do not use any products that contain nicotine or tobacco, such as cigarettes and e-cigarettes. If you need help quitting, ask your health care provider.  Keep all follow-up visits as told by your health care provider. This is important. Contact a health care provider if:  You are struggling to maintain a healthy diet or weight.  You need help to start on an exercise program.  You need help to stop smoking. Get help right away if:  You have chest pain.  You have trouble breathing. This information is not intended to replace advice given to you by your health care provider. Make sure you discuss any questions you have with your health care provider. Document Released: 09/27/2005 Document Revised: 09/30/2017 Document Reviewed: 03/27/2016 Elsevier Patient Education  2020 [employer].

## 2019-10-15 LAB — HM MAMMOGRAPHY

## 2019-10-18 ENCOUNTER — Other Ambulatory Visit: Payer: Self-pay

## 2019-10-23 ENCOUNTER — Ambulatory Visit (INDEPENDENT_AMBULATORY_CARE_PROVIDER_SITE_OTHER): Payer: 59

## 2019-10-23 ENCOUNTER — Other Ambulatory Visit: Payer: Self-pay

## 2019-10-23 VITALS — Temp 95.5°F

## 2019-10-23 DIAGNOSIS — E538 Deficiency of other specified B group vitamins: Secondary | ICD-10-CM | POA: Diagnosis not present

## 2019-10-23 MED ORDER — CYANOCOBALAMIN 1000 MCG/ML IJ SOLN
1000.0000 ug | Freq: Once | INTRAMUSCULAR | Status: AC
  Administered 2019-10-23: 10:00:00 1000 ug via INTRAMUSCULAR

## 2019-10-23 NOTE — Progress Notes (Signed)
Patient came in today for B-12 injection in right deltoid, IM. Patient tolerated well.

## 2019-11-05 ENCOUNTER — Encounter: Payer: Self-pay | Admitting: Internal Medicine

## 2019-11-27 ENCOUNTER — Other Ambulatory Visit: Payer: Self-pay

## 2019-11-27 ENCOUNTER — Ambulatory Visit (INDEPENDENT_AMBULATORY_CARE_PROVIDER_SITE_OTHER): Payer: 59

## 2019-11-27 DIAGNOSIS — E538 Deficiency of other specified B group vitamins: Secondary | ICD-10-CM | POA: Diagnosis not present

## 2019-11-27 MED ORDER — CYANOCOBALAMIN 1000 MCG/ML IJ SOLN
1000.0000 ug | Freq: Once | INTRAMUSCULAR | Status: AC
  Administered 2019-11-27: 1000 ug via INTRAMUSCULAR

## 2019-11-27 NOTE — Progress Notes (Signed)
Pt presents today for b12 injection. Left deltoid, IM. Pt tolerated injection well.

## 2019-12-26 ENCOUNTER — Other Ambulatory Visit: Payer: Self-pay

## 2019-12-26 ENCOUNTER — Other Ambulatory Visit: Payer: 59

## 2019-12-26 ENCOUNTER — Ambulatory Visit (INDEPENDENT_AMBULATORY_CARE_PROVIDER_SITE_OTHER): Payer: 59 | Admitting: *Deleted

## 2019-12-26 DIAGNOSIS — E538 Deficiency of other specified B group vitamins: Secondary | ICD-10-CM | POA: Diagnosis not present

## 2019-12-26 DIAGNOSIS — R946 Abnormal results of thyroid function studies: Secondary | ICD-10-CM | POA: Diagnosis not present

## 2019-12-26 DIAGNOSIS — I10 Essential (primary) hypertension: Secondary | ICD-10-CM

## 2019-12-26 LAB — COMPREHENSIVE METABOLIC PANEL
ALT: 18 U/L (ref 0–35)
AST: 18 U/L (ref 0–37)
Albumin: 4.7 g/dL (ref 3.5–5.2)
Alkaline Phosphatase: 40 U/L (ref 39–117)
BUN: 17 mg/dL (ref 6–23)
CO2: 25 mEq/L (ref 19–32)
Calcium: 10.5 mg/dL (ref 8.4–10.5)
Chloride: 106 mEq/L (ref 96–112)
Creatinine, Ser: 0.94 mg/dL (ref 0.40–1.20)
GFR: 59.02 mL/min — ABNORMAL LOW (ref >60.00)
Glucose, Bld: 98 mg/dL (ref 70–99)
Potassium: 4.5 mEq/L (ref 3.5–5.1)
Sodium: 139 mEq/L (ref 135–145)
Total Bilirubin: 0.8 mg/dL (ref 0.2–1.2)
Total Protein: 7.4 g/dL (ref 6.0–8.3)

## 2019-12-26 LAB — LIPID PANEL
Cholesterol: 198 mg/dL (ref 0–200)
HDL: 48.4 mg/dL (ref >39.00)
LDL Cholesterol: 114 mg/dL — ABNORMAL HIGH (ref 0–99)
NonHDL: 149.52
Total CHOL/HDL Ratio: 4
Triglycerides: 176 mg/dL — ABNORMAL HIGH (ref 0.0–149.0)
VLDL: 35.2 mg/dL (ref 0.0–40.0)

## 2019-12-26 LAB — CBC WITH DIFFERENTIAL/PLATELET
Basophils Absolute: 0 10*3/uL (ref 0.0–0.1)
Basophils Relative: 0.6 % (ref 0.0–3.0)
Eosinophils Absolute: 0.1 10*3/uL (ref 0.0–0.7)
Eosinophils Relative: 2.7 % (ref 0.0–5.0)
HCT: 40.1 % (ref 36.0–46.0)
Hemoglobin: 13.6 g/dL (ref 12.0–15.0)
Lymphocytes Relative: 47 % — ABNORMAL HIGH (ref 12.0–46.0)
Lymphs Abs: 2.2 10*3/uL (ref 0.7–4.0)
MCHC: 33.9 g/dL (ref 30.0–36.0)
MCV: 89 fl (ref 78.0–100.0)
Monocytes Absolute: 0.5 10*3/uL (ref 0.1–1.0)
Monocytes Relative: 10.4 % (ref 3.0–12.0)
Neutro Abs: 1.9 10*3/uL (ref 1.4–7.7)
Neutrophils Relative %: 39.3 % — ABNORMAL LOW (ref 43.0–77.0)
Platelets: 336 10*3/uL (ref 150.0–400.0)
RBC: 4.51 Mil/uL (ref 3.87–5.11)
RDW: 12.8 % (ref 11.5–15.5)
WBC: 4.7 10*3/uL (ref 4.0–10.5)

## 2019-12-26 LAB — TSH: TSH: 6.43 u[IU]/mL — ABNORMAL HIGH (ref 0.35–4.50)

## 2019-12-26 LAB — VITAMIN B12: Vitamin B-12: >1500 pg/mL — ABNORMAL HIGH (ref 211–911)

## 2019-12-26 MED ORDER — CYANOCOBALAMIN 1000 MCG/ML IJ SOLN
1000.0000 ug | Freq: Once | INTRAMUSCULAR | Status: AC
  Administered 2019-12-26: 1000 ug via INTRAMUSCULAR

## 2019-12-26 NOTE — Progress Notes (Signed)
Patient presented for B 12 injection to right deltoid, patient voiced no concerns nor showed any signs of distress during injection. 

## 2020-01-02 ENCOUNTER — Ambulatory Visit (INDEPENDENT_AMBULATORY_CARE_PROVIDER_SITE_OTHER): Payer: 59 | Admitting: Internal Medicine

## 2020-01-02 ENCOUNTER — Encounter: Payer: Self-pay | Admitting: Internal Medicine

## 2020-01-02 ENCOUNTER — Telehealth: Payer: Self-pay | Admitting: Internal Medicine

## 2020-01-02 ENCOUNTER — Other Ambulatory Visit: Payer: Self-pay

## 2020-01-02 VITALS — BP 130/78 | HR 82 | Temp 98.1°F | Ht 66.0 in | Wt 195.8 lb

## 2020-01-02 DIAGNOSIS — R600 Localized edema: Secondary | ICD-10-CM

## 2020-01-02 DIAGNOSIS — N3281 Overactive bladder: Secondary | ICD-10-CM

## 2020-01-02 DIAGNOSIS — M797 Fibromyalgia: Secondary | ICD-10-CM | POA: Diagnosis not present

## 2020-01-02 DIAGNOSIS — M8580 Other specified disorders of bone density and structure, unspecified site: Secondary | ICD-10-CM

## 2020-01-02 DIAGNOSIS — R7989 Other specified abnormal findings of blood chemistry: Secondary | ICD-10-CM

## 2020-01-02 DIAGNOSIS — E785 Hyperlipidemia, unspecified: Secondary | ICD-10-CM

## 2020-01-02 DIAGNOSIS — E538 Deficiency of other specified B group vitamins: Secondary | ICD-10-CM

## 2020-01-02 DIAGNOSIS — Z1231 Encounter for screening mammogram for malignant neoplasm of breast: Secondary | ICD-10-CM

## 2020-01-02 MED ORDER — FUROSEMIDE 20 MG PO TABS: 20.0000 mg | ORAL_TABLET | Freq: Every day | ORAL | 5 refills | Status: DC | PRN

## 2020-01-02 MED ORDER — OLMESARTAN MEDOXOMIL 20 MG PO TABS: 20.0000 mg | ORAL_TABLET | Freq: Every day | ORAL | 3 refills | Status: DC

## 2020-01-02 MED ORDER — MIRABEGRON ER 25 MG PO TB24: 25.0000 mg | ORAL_TABLET | Freq: Every day | ORAL | 3 refills | Status: DC

## 2020-01-02 MED ORDER — FAMOTIDINE 10 MG PO TABS: 10.0000 mg | ORAL_TABLET | Freq: Every day | ORAL | 3 refills | Status: DC | PRN

## 2020-01-02 MED ORDER — ROSUVASTATIN CALCIUM 10 MG PO TABS: 10.0000 mg | ORAL_TABLET | Freq: Every day | ORAL | 3 refills | Status: DC

## 2020-01-02 MED ORDER — AMITRIPTYLINE HCL 25 MG PO TABS: 25.0000 mg | ORAL_TABLET | Freq: Every day | ORAL | 3 refills | Status: DC

## 2020-01-02 MED ORDER — COLESTIPOL HCL 1 G PO TABS: 2.0000 g | ORAL_TABLET | Freq: Every day | ORAL | 3 refills | Status: DC

## 2020-01-02 MED ORDER — FENOFIBRATE MICRONIZED 130 MG PO CAPS: 130.0000 mg | ORAL_CAPSULE | Freq: Every day | ORAL | 3 refills | Status: DC

## 2020-01-02 NOTE — Patient Instructions (Addendum)
Increase water intake 55 to 64 ounces daily   Mammogram and bone density 10/2020 at [facility]   Kegel Exercises  Kegel exercises can help strengthen your pelvic floor muscles. The pelvic floor is a group of muscles that support your rectum, small intestine, and bladder. In females, pelvic floor muscles also help support the womb (uterus). These muscles help you control the flow of urine and stool. Kegel exercises are painless and simple, and they do not require any equipment. Your provider may suggest Kegel exercises to:  Improve bladder and bowel control.  Improve sexual response.  Improve weak pelvic floor muscles after surgery to remove the uterus (hysterectomy) or pregnancy (females).  Improve weak pelvic floor muscles after prostate gland removal or surgery (males). Kegel exercises involve squeezing your pelvic floor muscles, which are the same muscles you squeeze when you try to stop the flow of urine or keep from passing gas. The exercises can be done while sitting, standing, or lying down, but it is best to vary your position. Exercises How to do Kegel exercises: 1. Squeeze your pelvic floor muscles tight. You should feel a tight lift in your rectal area. If you are a female, you should also feel a tightness in your vaginal area. Keep your stomach, buttocks, and legs relaxed. 2. Hold the muscles tight for up to 10 seconds. 3. Breathe normally. 4. Relax your muscles. 5. Repeat as told by your health care provider. Repeat this exercise daily as told by your health care provider. Continue to do this exercise for at least 4-6 weeks, or for as long as told by your health care provider. You may be referred to a physical therapist who can help you learn more about how to do Kegel exercises. Depending on your condition, your health care provider may recommend:  Varying how long you squeeze your muscles.  Doing several sets of exercises every day.  Doing exercises for several  weeks.  Making Kegel exercises a part of your regular exercise routine. This information is not intended to replace advice given to you by your health care provider. Make sure you discuss any questions you have with your health care provider. Document Revised: 05/17/2018 Document Reviewed: 05/17/2018 Elsevier Patient Education  [place].

## 2020-01-02 NOTE — Progress Notes (Addendum)
Chief Complaint  Patient presents with  . Follow-up   F/u  1. BP borderline today on benicar 20 mg qd  2. HLD on crestor 12/28/19 10 mg qd reviewed labs 12/26/19  3. Elevated TSH agreeable to thyroid US  4. B12 def on B12 1x per month with increased energy due to B12 being elevated disc with pt can do B12 qomonth  5. Overactive bladder myrbetriq 25 mg qd is helping disc also consider gyn/urogyn in future due to bladder leakage    Review of Systems  Constitutional: Positive for weight loss.       Down 1 lbs   HENT: Negative for hearing loss.   Eyes: Negative for blurred vision.  Respiratory: Negative for shortness of breath.   Cardiovascular: Negative for chest pain.  Gastrointestinal: Negative for abdominal pain.  Musculoskeletal: Negative for falls.  Skin: Negative for rash.  Neurological: Negative for headaches.  Psychiatric/Behavioral: Negative for depression.   Past Medical History:  Diagnosis Date  . Arthritis   . Carotid stenosis    b/l w/o sx's   . Fibromyalgia   . Fibromyalgia   . Gastric ulcer    1970s  . GERD (gastroesophageal reflux disease)   . History of colon polyps   . History of UTI   . Hyperlipidemia   . Hypertension   . Inflammatory polyps of colon ([place])    08/2008 OK 08/2010 Ok  byk Dr Earlean Shawl  . Pneumonia 12/2011  . Prediabetes   . Ulcer   . UTI (urinary tract infection)   . Vitamin D deficiency    Past Surgical History:  Procedure Laterality Date  . CHOLECYSTECTOMY N/A 01/07/2019   Procedure: LAPAROSCOPIC CHOLECYSTECTOMY;  Surgeon: Ralene Ok, MD;  Location: [facility];  Service: General;  Laterality: N/A;  . HERNIA REPAIR  1978   Family History  Problem Relation Age of Onset  . Hyperlipidemia Mother   . Alzheimer's disease Mother   . Cancer Mother        breast cancer dx'ed 93 y.o   . Depression Mother   . Miscarriages / [place] Mother   . Diabetes Father   . Heart disease Father        CABG x 7   . Hyperlipidemia Father   .  Mental illness Father   . Alzheimer's disease Father   . Depression Father   . Early death Father   . Colon polyps Father   . Cancer Paternal Grandmother        colon  . Diabetes Paternal Grandmother   . Heart disease Cousin        sudden death less than 73 yrs old heart attack.  . Deep vein thrombosis Maternal Grandmother   . Cancer Maternal Grandfather        throat smoker   Social History   Socioeconomic History  . Marital status: Married    Spouse name: Not on file  . Number of children: Not on file  . Years of education: Not on file  . Highest education level: Not on file  Occupational History  . Not on file  Tobacco Use  . Smoking status: Former Smoker    Quit date: 10/11/1980    Years since quitting: 39.2  . Smokeless tobacco: Never Used  Substance and Sexual Activity  . Alcohol use: No    Alcohol/week: 0.0 standard drinks  . Drug use: No  . Sexual activity: Yes  Other Topics Concern  . Not on file  Social History Narrative  No guns, wears seat belt    Safe in relationship   Married    2 kids (boy and girl) and 3 grands   Retired Armed forces operational officer, some college    Social Determinants of Radio broadcast assistant Strain:   . Difficulty of Paying Living Expenses:   Food Insecurity:   . Worried About Charity fundraiser in the Last Year:   . Arboriculturist in the Last Year:   Transportation Needs:   . Film/video editor (Medical):   Marland Kitchen Lack of Transportation (Non-Medical):   Physical Activity:   . Days of Exercise per Week:   . Minutes of Exercise per Session:   Stress:   . Feeling of Stress :   Social Connections:   . Frequency of Communication with Friends and Family:   . Frequency of Social Gatherings with Friends and Family:   . Attends Religious Services:   . Active Member of Clubs or Organizations:   . Attends Archivist Meetings:   Marland Kitchen Marital Status:   Intimate Partner Violence:   . Fear of Current or Ex-Partner:   . Emotionally  Abused:   Marland Kitchen Physically Abused:   . Sexually Abused:    Current Meds  Medication Sig  . acetaminophen (TYLENOL) 325 MG tablet Take 650 mg by mouth every 6 (six) hours as needed.  Marland Kitchen amitriptyline (ELAVIL) 25 MG tablet Take 1 tablet (25 mg total) by mouth at bedtime.  . Cholecalciferol (VITAMIN D3) 50 MCG (2000 UT) capsule Take 4,000 Units by mouth daily.  . colestipol (COLESTID) 1 g tablet Take 2 tablets (2 g total) by mouth daily.  Marland Kitchen docusate sodium (COLACE) 100 MG capsule Take 1 capsule (100 mg total) by mouth daily as needed.  . famotidine (ACID REDUCER) 10 MG tablet Take 1 tablet (10 mg total) by mouth daily as needed for heartburn.  . fenofibrate micronized (ANTARA) 130 MG capsule Take 1 capsule (130 mg total) by mouth daily before breakfast.  . mirabegron ER (MYRBETRIQ) 25 MG TB24 tablet Take 1 tablet (25 mg total) by mouth daily.  Marland Kitchen olmesartan (BENICAR) 20 MG tablet Take 1 tablet (20 mg total) by mouth daily.  . rosuvastatin (CRESTOR) 10 MG tablet Take 1 tablet (10 mg total) by mouth at bedtime.  . [DISCONTINUED] amitriptyline (ELAVIL) 25 MG tablet Take 1 tablet (25 mg total) by mouth at bedtime.  . [DISCONTINUED] colestipol (COLESTID) 1 g tablet Take 2 tablets (2 g total) by mouth daily.  . [DISCONTINUED] famotidine (ACID REDUCER) 10 MG tablet Take 10 mg by mouth daily as needed for heartburn.   . [DISCONTINUED] fenofibrate micronized (ANTARA) 130 MG capsule Take 1 capsule (130 mg total) by mouth daily before breakfast.  . [DISCONTINUED] mirabegron ER (MYRBETRIQ) 25 MG TB24 tablet Take 1 tablet (25 mg total) by mouth daily.  . [DISCONTINUED] olmesartan (BENICAR) 20 MG tablet Take 1 tablet (20 mg total) by mouth daily.  . [DISCONTINUED] rosuvastatin (CRESTOR) 5 MG tablet Take 1 tablet (5 mg total) by mouth at bedtime.   Allergies  Allergen Reactions  . Atorvastatin Other (See Comments)  . Norvasc [Amlodipine Besylate]     Leg swelling   . Simvastatin Other (See Comments)  . Zetia  [Ezetimibe] Other (See Comments)    Muscle aches!!!   Recent Results (from the past 2160 hour(s))  [facility] MAMMOGRAPHY     Status: None   Collection Time: 10/15/19 12:00 AM  Result Value Ref Range   HM  Mammogram 0-4 Bi-Rad 0-4 Bi-Rad, Self Reported Normal    Comment: 10/15/19 negative [facility]   B12     Status: Abnormal   Collection Time: 12/26/19  9:20 AM  Result Value Ref Range   Vitamin B-12 >1500 (H) 211 - 911 pg/mL  CBC with Differential/Platelet     Status: Abnormal   Collection Time: 12/26/19  9:20 AM  Result Value Ref Range   WBC 4.7 4.0 - 10.5 K/uL   RBC 4.51 3.87 - 5.11 Mil/uL   Hemoglobin 13.6 12.0 - 15.0 g/dL   HCT 40.1 36.0 - 46.0 %   MCV 89.0 78.0 - 100.0 fl   MCHC 33.9 30.0 - 36.0 g/dL   RDW 12.8 11.5 - 15.5 %   Platelets 336.0 150.0 - 400.0 K/uL   Neutrophils Relative % 39.3 (L) 43.0 - 77.0 %   Lymphocytes Relative 47.0 (H) 12.0 - 46.0 %   Monocytes Relative 10.4 3.0 - 12.0 %   Eosinophils Relative 2.7 0.0 - 5.0 %   Basophils Relative 0.6 0.0 - 3.0 %   Neutro Abs 1.9 1.4 - 7.7 K/uL   Lymphs Abs 2.2 0.7 - 4.0 K/uL   Monocytes Absolute 0.5 0.1 - 1.0 K/uL   Eosinophils Absolute 0.1 0.0 - 0.7 K/uL   Basophils Absolute 0.0 0.0 - 0.1 K/uL  TSH     Status: Abnormal   Collection Time: 12/26/19  9:20 AM  Result Value Ref Range   TSH 6.43 (H) 0.35 - 4.50 uIU/mL  Lipid panel     Status: Abnormal   Collection Time: 12/26/19  9:20 AM  Result Value Ref Range   Cholesterol 198 0 - 200 mg/dL    Comment: ATP III Classification       Desirable:  < 200 mg/dL               Borderline High:  200 - 239 mg/dL          High:  > = 240 mg/dL   Triglycerides 176.0 (H) 0.0 - 149.0 mg/dL    Comment: Normal:  <150 mg/dLBorderline High:  150 - 199 mg/dL   HDL 48.40 >39.00 mg/dL   VLDL 35.2 0.0 - 40.0 mg/dL   LDL Cholesterol 114 (H) 0 - 99 mg/dL   Total CHOL/HDL Ratio 4     Comment:                Men          Women1/2 Average Risk     3.4          3.3Average Risk          5.0          4.42X  Average Risk          9.6          7.13X Average Risk          15.0          11.0                       NonHDL 149.52     Comment: NOTE:  Non-HDL goal should be 30 mg/dL higher than patient's LDL goal (i.e. LDL goal of < 70 mg/dL, would have non-HDL goal of < 100 mg/dL)  Comprehensive metabolic panel     Status: Abnormal   Collection Time: 12/26/19  9:20 AM  Result Value Ref Range   Sodium 139 135 - 145 mEq/L   Potassium 4.5  3.5 - 5.1 mEq/L   Chloride 106 96 - 112 mEq/L   CO2 25 19 - 32 mEq/L   Glucose, Bld 98 70 - 99 mg/dL   BUN 17 6 - 23 mg/dL   Creatinine, Ser 0.94 0.40 - 1.20 mg/dL   Total Bilirubin 0.8 0.2 - 1.2 mg/dL   Alkaline Phosphatase 40 39 - 117 U/L   AST 18 0 - 37 U/L   ALT 18 0 - 35 U/L   Total Protein 7.4 6.0 - 8.3 g/dL   Albumin 4.7 3.5 - 5.2 g/dL   GFR 59.02 (L) >60.00 mL/min   Calcium 10.5 8.4 - 10.5 mg/dL   Objective  Body mass index is 31.6 kg/m. Wt Readings from Last 3 Encounters:  01/02/20 195 lb 12.8 oz (88.8 kg)  09/26/19 196 lb (88.9 kg)  01/06/19 221 lb 5.5 oz (100.4 kg)   Temp Readings from Last 3 Encounters:  01/02/20 98.1 F (36.7 C) (Temporal)  10/23/19 (!) 95.5 F (35.3 C) (Temporal)  01/08/19 98.5 F (36.9 C) (Oral)   BP Readings from Last 3 Encounters:  01/02/20 130/78  01/08/19 (!) 142/84  11/29/18 138/90   Pulse Readings from Last 3 Encounters:  01/02/20 82  01/08/19 82  11/29/18 95    Physical Exam Vitals and nursing note reviewed.  Constitutional:      Appearance: Normal appearance. She is well-developed and well-groomed. She is obese.  HENT:     Head: Normocephalic and atraumatic.  Eyes:     Conjunctiva/sclera: Conjunctivae normal.     Pupils: Pupils are equal, round, and reactive to light.  Cardiovascular:     Rate and Rhythm: Normal rate and regular rhythm.     Heart sounds: Normal heart sounds. No murmur.  Pulmonary:     Effort: Pulmonary effort is normal.     Breath sounds: Normal breath sounds.  Abdominal:      General: Abdomen is flat. Bowel sounds are normal.     Tenderness: There is no abdominal tenderness.  Skin:    General: Skin is warm and dry.  Neurological:     General: No focal deficit present.     Mental Status: She is alert and oriented to person, place, and time. Mental status is at baseline.     Gait: Gait normal.  Psychiatric:        Attention and Perception: Attention and perception normal.        Mood and Affect: Mood and affect normal.        Speech: Speech normal.        Behavior: Behavior normal. Behavior is cooperative.        Thought Content: Thought content normal.        Cognition and Memory: Cognition and memory normal.        Judgment: Judgment normal.     Assessment  Plan  Hyperlipidemia, unspecified hyperlipidemia type - Plan: fenofibrate micronized (ANTARA) 130 MG capsule, rosuvastatin (CRESTOR) 10 MG tablet since 12/28/19   Leg edema - Plan: furosemide (LASIX) 20 MG tablet prn though not really taking  Overactive bladder - Plan: mirabegron ER (MYRBETRIQ) 25 MG TB24 tablet  Fibromyalgia - Plan: amitriptyline (ELAVIL) 25 MG tablet on for years and stable  Elevated TSH - Plan: US THYROID  B12 deficiency  B12 shot QOmonth instead of monthly   HM Flu shotutd shingrix 2/2 prevnar utd pna 23 due in future if not had  rec Tdapgiven Rx 01/02/20 to fill  covid vx 2/2  Pap negative 05/10/16 neg HPV will hold paps for now out of age window Mammogram [facility] get copy release signedhad 09/29/18 negative 10/15/19 mammo neg [facility] will order with DEXA  Colonoscopy 09/25/18 h/o polyps per pt get copy from Dr. Earlean Shawl in [place]  08/28/15 [facility] medium colonoscopy repeat in 5 years Dr. Earlean Shawl no report from 09/25/18  DEXA 09/21/17 osteopenia rec vitamin D3 50K IU weekly then 5000 IU daily  Dermatology9/2020 Dr. Isenstein3/24/21  D3 Maudry Mayhew Iu qd   former PCP Dr. Candiss Norse   Provider: Dr. Olivia Mackie McLean-Scocuz[facility]

## 2020-01-02 NOTE — Telephone Encounter (Signed)
Faxed request for records of last colonoscopy to [facility] in [place].

## 2020-01-08 ENCOUNTER — Other Ambulatory Visit: Payer: Self-pay

## 2020-01-08 ENCOUNTER — Ambulatory Visit
Admission: RE | Admit: 2020-01-08 | Discharge: 2020-01-08 | Disposition: A | Discharge status: Home / Self Care | Payer: 59 | Source: Ambulatory Visit | Attending: Internal Medicine | Admitting: Internal Medicine

## 2020-01-08 DIAGNOSIS — R7989 Other specified abnormal findings of blood chemistry: Secondary | ICD-10-CM | POA: Diagnosis present

## 2020-01-09 ENCOUNTER — Encounter: Payer: Self-pay | Admitting: Internal Medicine

## 2020-01-21 NOTE — Telephone Encounter (Signed)
Received records and placed on your desk.

## 2020-01-23 ENCOUNTER — Telehealth: Payer: Self-pay | Admitting: Internal Medicine

## 2020-01-23 NOTE — Telephone Encounter (Signed)
-----   Message from Delorise Jackson, MD sent at 12/26/2019  4:50 PM EDT ----- Liver kidneys normal but kidney #s increased slightly  -make sure drinking 55-64 ounces daily avoid NSAIDS  Cholesterol improved but LDL and triglycerides still elevated  -do you want to increase crestor to 10 mg daily?   B12 elevated this will not hurt her but is now improved  Cont B12 injections monthly or every other month I would be ok with as well   Blood cts normal except lymphocytes slightly elevated will monitor   Thyroid lab elevated  -is she agreeable to thyroid US to look for nodules?  -is she taking biotin skin hair and nails vitamin, if so stop please

## 2020-01-23 NOTE — Telephone Encounter (Signed)
-----   Message from Delorise Jackson, MD sent at 01/09/2020  5:05 PM EDT ----- Normal thyroid no nodules

## 2020-01-23 NOTE — Telephone Encounter (Signed)
1st attempt  Thressa Sheller, [facility]  12/27/2019 3:25 PM EDT    Left message to return call.   2nd attempt   Thressa Sheller, [facility]  01/10/2020 4:34 PM EDT    Left message to return call.   3rd attempt.: 01/23/20 at 2:26 pm. Left message to return call.  Letter mailed and voicemail left for patient to contact the office.

## 2020-01-24 NOTE — Telephone Encounter (Signed)
Pt calling back. Patient informed and verbalized understanding.   Pt states she is already taking the Crestor 10 mg daily. Pt will increase water intake and avoid NSAID's. Pt is agreeable to every other month b12 injections.   Pt states she had a Tetanus shot this Saturday at [facility] will call for records. Call and documented vaccine.

## 2020-01-25 NOTE — Telephone Encounter (Signed)
Pt can try diet and exercise with crestor 10 mg daily  Or  We can increase crestor 20 mg   Or  Add zetia to crestor   What would she like?

## 2020-01-28 ENCOUNTER — Encounter: Payer: Self-pay | Admitting: Internal Medicine

## 2020-01-28 NOTE — Telephone Encounter (Signed)
Left message to return call 

## 2020-01-28 NOTE — Telephone Encounter (Signed)
Pt increased from the 5 mg to 10 mg on 01/02/20 after her visit. Pt will do the 10 mg daily along with diet and exercise. States when first starting the 10 mg she got muscle aches but is doing good now.

## 2020-01-28 NOTE — Telephone Encounter (Signed)
Pt returned your call and would like a call back .. °

## 2020-01-28 NOTE — Telephone Encounter (Signed)
Can we schedule B12 injections every other month   Thank you   [place]

## 2020-01-29 ENCOUNTER — Ambulatory Visit (INDEPENDENT_AMBULATORY_CARE_PROVIDER_SITE_OTHER): Payer: 59

## 2020-01-29 ENCOUNTER — Other Ambulatory Visit: Payer: Self-pay

## 2020-01-29 DIAGNOSIS — E538 Deficiency of other specified B group vitamins: Secondary | ICD-10-CM | POA: Diagnosis not present

## 2020-01-29 MED ORDER — CYANOCOBALAMIN 1000 MCG/ML IJ SOLN
1000.0000 ug | Freq: Once | INTRAMUSCULAR | Status: AC
  Administered 2020-01-29: 1000 ug via INTRAMUSCULAR

## 2020-01-29 NOTE — Progress Notes (Signed)
Patient presented for B 12 injection to right deltoid, patient voiced no concerns nor showed any signs of distress during injection. 

## 2020-02-14 ENCOUNTER — Ambulatory Visit: Payer: 59 | Admitting: Internal Medicine

## 2020-04-01 ENCOUNTER — Ambulatory Visit (INDEPENDENT_AMBULATORY_CARE_PROVIDER_SITE_OTHER): Payer: 59

## 2020-04-01 ENCOUNTER — Other Ambulatory Visit: Payer: Self-pay

## 2020-04-01 DIAGNOSIS — E538 Deficiency of other specified B group vitamins: Secondary | ICD-10-CM

## 2020-04-01 MED ORDER — CYANOCOBALAMIN 1000 MCG/ML IJ SOLN
1000.0000 ug | Freq: Once | INTRAMUSCULAR | Status: AC
  Administered 2020-04-01: 1000 ug via INTRAMUSCULAR

## 2020-04-01 NOTE — Progress Notes (Signed)
Patient presented for B 12 injection to left deltoid, patient voiced no concerns nor showed any signs of distress during injection. 

## 2020-05-21 ENCOUNTER — Other Ambulatory Visit: Payer: Self-pay

## 2020-05-21 MED ORDER — OLMESARTAN MEDOXOMIL 20 MG PO TABS: 20.0000 mg | ORAL_TABLET | Freq: Every day | ORAL | 3 refills | Status: DC

## 2020-06-03 ENCOUNTER — Other Ambulatory Visit: Payer: Self-pay

## 2020-06-03 ENCOUNTER — Ambulatory Visit (INDEPENDENT_AMBULATORY_CARE_PROVIDER_SITE_OTHER): Payer: 59

## 2020-06-03 DIAGNOSIS — E538 Deficiency of other specified B group vitamins: Secondary | ICD-10-CM

## 2020-06-03 MED ORDER — CYANOCOBALAMIN 1000 MCG/ML IJ SOLN
1000.0000 ug | Freq: Once | INTRAMUSCULAR | Status: AC
  Administered 2020-06-03: 1000 ug via INTRAMUSCULAR

## 2020-06-03 NOTE — Progress Notes (Addendum)
Patient presented for B 12 injection to right deltoid, patient voiced no concerns nor showed any signs of distress during injection.  Reviewed.  Dr Scott 

## 2020-06-16 ENCOUNTER — Other Ambulatory Visit: Payer: Self-pay | Admitting: Internal Medicine

## 2020-06-16 DIAGNOSIS — E785 Hyperlipidemia, unspecified: Secondary | ICD-10-CM

## 2020-06-16 MED ORDER — FENOFIBRATE MICRONIZED 130 MG PO CAPS: 130.0000 mg | ORAL_CAPSULE | Freq: Every day | ORAL | 3 refills | Status: DC

## 2020-07-02 ENCOUNTER — Other Ambulatory Visit (INDEPENDENT_AMBULATORY_CARE_PROVIDER_SITE_OTHER): Payer: 59

## 2020-07-02 ENCOUNTER — Other Ambulatory Visit: Payer: Self-pay

## 2020-07-02 DIAGNOSIS — N3281 Overactive bladder: Secondary | ICD-10-CM

## 2020-07-02 DIAGNOSIS — R7989 Other specified abnormal findings of blood chemistry: Secondary | ICD-10-CM | POA: Diagnosis not present

## 2020-07-02 DIAGNOSIS — R600 Localized edema: Secondary | ICD-10-CM | POA: Diagnosis not present

## 2020-07-02 DIAGNOSIS — M8580 Other specified disorders of bone density and structure, unspecified site: Secondary | ICD-10-CM

## 2020-07-02 DIAGNOSIS — E785 Hyperlipidemia, unspecified: Secondary | ICD-10-CM

## 2020-07-02 DIAGNOSIS — E538 Deficiency of other specified B group vitamins: Secondary | ICD-10-CM

## 2020-07-02 DIAGNOSIS — M797 Fibromyalgia: Secondary | ICD-10-CM

## 2020-07-02 LAB — CBC WITH DIFFERENTIAL/PLATELET
Basophils Absolute: 0 10*3/uL (ref 0.0–0.1)
Basophils Relative: 0.4 % (ref 0.0–3.0)
Eosinophils Absolute: 0.1 10*3/uL (ref 0.0–0.7)
Eosinophils Relative: 2.6 % (ref 0.0–5.0)
HCT: 38.6 % (ref 36.0–46.0)
Hemoglobin: 13.3 g/dL (ref 12.0–15.0)
Lymphocytes Relative: 45.3 % (ref 12.0–46.0)
Lymphs Abs: 2.4 10*3/uL (ref 0.7–4.0)
MCHC: 34.5 g/dL (ref 30.0–36.0)
MCV: 88.2 fl (ref 78.0–100.0)
Monocytes Absolute: 0.6 10*3/uL (ref 0.1–1.0)
Monocytes Relative: 11.4 % (ref 3.0–12.0)
Neutro Abs: 2.1 10*3/uL (ref 1.4–7.7)
Neutrophils Relative %: 40.3 % — ABNORMAL LOW (ref 43.0–77.0)
Platelets: 338 10*3/uL (ref 150.0–400.0)
RBC: 4.38 Mil/uL (ref 3.87–5.11)
RDW: 12.9 % (ref 11.5–15.5)
WBC: 5.3 10*3/uL (ref 4.0–10.5)

## 2020-07-02 LAB — LIPID PANEL
Cholesterol: 194 mg/dL (ref 0–200)
HDL: 46.3 mg/dL (ref >39.00)
LDL Cholesterol: 109 mg/dL — ABNORMAL HIGH (ref 0–99)
NonHDL: 147.93
Total CHOL/HDL Ratio: 4
Triglycerides: 193 mg/dL — ABNORMAL HIGH (ref 0.0–149.0)
VLDL: 38.6 mg/dL (ref 0.0–40.0)

## 2020-07-02 LAB — COMPREHENSIVE METABOLIC PANEL
ALT: 18 U/L (ref 0–35)
AST: 18 U/L (ref 0–37)
Albumin: 4.8 g/dL (ref 3.5–5.2)
Alkaline Phosphatase: 37 U/L — ABNORMAL LOW (ref 39–117)
BUN: 17 mg/dL (ref 6–23)
CO2: 27 mEq/L (ref 19–32)
Calcium: 10.7 mg/dL — ABNORMAL HIGH (ref 8.4–10.5)
Chloride: 103 mEq/L (ref 96–112)
Creatinine, Ser: 1.02 mg/dL (ref 0.40–1.20)
GFR: 53.63 mL/min — ABNORMAL LOW (ref >60.00)
Glucose, Bld: 96 mg/dL (ref 70–99)
Potassium: 4.6 mEq/L (ref 3.5–5.1)
Sodium: 139 mEq/L (ref 135–145)
Total Bilirubin: 0.9 mg/dL (ref 0.2–1.2)
Total Protein: 7.5 g/dL (ref 6.0–8.3)

## 2020-07-02 LAB — TSH: TSH: 6.99 u[IU]/mL — ABNORMAL HIGH (ref 0.35–4.50)

## 2020-07-02 NOTE — Addendum Note (Signed)
Addended by: Leeanne Rio on: 07/02/2020 09:16 AM   Modules accepted: Orders

## 2020-07-03 ENCOUNTER — Other Ambulatory Visit: Payer: Self-pay | Admitting: Internal Medicine

## 2020-07-03 LAB — URINALYSIS, ROUTINE W REFLEX MICROSCOPIC
Bilirubin Urine: NEGATIVE
Glucose, UA: NEGATIVE
Hgb urine dipstick: NEGATIVE
Ketones, ur: NEGATIVE
Leukocytes,Ua: NEGATIVE
Nitrite: NEGATIVE
Protein, ur: NEGATIVE
Specific Gravity, Urine: 1.008 (ref 1.001–1.03)
pH: 6 (ref 5.0–8.0)

## 2020-07-03 MED ORDER — COLESTIPOL HCL 1 G PO TABS: 2.0000 g | ORAL_TABLET | Freq: Every day | ORAL | 3 refills | Status: DC

## 2020-07-04 ENCOUNTER — Other Ambulatory Visit (INDEPENDENT_AMBULATORY_CARE_PROVIDER_SITE_OTHER): Payer: 59

## 2020-07-04 ENCOUNTER — Other Ambulatory Visit: Payer: Self-pay | Admitting: Internal Medicine

## 2020-07-04 DIAGNOSIS — R946 Abnormal results of thyroid function studies: Secondary | ICD-10-CM | POA: Diagnosis not present

## 2020-07-04 NOTE — Progress Notes (Signed)
Faxed add on sheet is sent!

## 2020-07-07 LAB — THYROID PEROXIDASE ANTIBODY: Thyroperoxidase Ab SerPl-aCnc: <1 IU/mL (ref <9)

## 2020-07-07 LAB — T4, FREE: Free T4: 0.76 ng/dL (ref 0.60–1.60)

## 2020-07-07 LAB — T3, FREE: T3, Free: 3.3 pg/mL (ref 2.3–4.2)

## 2020-07-09 ENCOUNTER — Ambulatory Visit: Payer: 59 | Admitting: Internal Medicine

## 2020-07-09 ENCOUNTER — Other Ambulatory Visit: Payer: Self-pay

## 2020-07-09 ENCOUNTER — Encounter: Payer: Self-pay | Admitting: Internal Medicine

## 2020-07-09 DIAGNOSIS — Z1231 Encounter for screening mammogram for malignant neoplasm of breast: Secondary | ICD-10-CM | POA: Diagnosis not present

## 2020-07-09 DIAGNOSIS — E2839 Other primary ovarian failure: Secondary | ICD-10-CM | POA: Diagnosis not present

## 2020-07-09 DIAGNOSIS — M47816 Spondylosis without myelopathy or radiculopathy, lumbar region: Secondary | ICD-10-CM

## 2020-07-09 DIAGNOSIS — E669 Obesity, unspecified: Secondary | ICD-10-CM

## 2020-07-09 DIAGNOSIS — R200 Anesthesia of skin: Secondary | ICD-10-CM

## 2020-07-09 DIAGNOSIS — M8580 Other specified disorders of bone density and structure, unspecified site: Secondary | ICD-10-CM

## 2020-07-09 DIAGNOSIS — N3281 Overactive bladder: Secondary | ICD-10-CM

## 2020-07-09 DIAGNOSIS — R7989 Other specified abnormal findings of blood chemistry: Secondary | ICD-10-CM

## 2020-07-09 MED ORDER — MIRABEGRON ER 50 MG PO TB24: 50.0000 mg | ORAL_TABLET | Freq: Every day | ORAL | 3 refills | Status: DC

## 2020-07-09 NOTE — Patient Instructions (Addendum)
Drink 55-64 ounces daily  Results for Beth Fisher, Beth Fisher (MRN [id number]) as of 07/09/2020 09:57  Ref. Range 06/05/2019 08:48 12/26/2019 09:20 07/02/2020 09:16  Cholesterol Latest Ref Range: 0 - 200 mg/dL 217 (H) 198 194  HDL Cholesterol Latest Ref Range: >39.00 mg/dL 45.50 48.40 46.30  LDL (calc) Latest Ref Range: 0 - 99 mg/dL  114 (H) 109 (H)  Direct LDL Latest Units: mg/dL 133.0    NonHDL Unknown 171.71 149.52 147.93  Triglycerides Latest Ref Range: 0 - 149 mg/dL 206.0 (H) 176.0 (H) 193.0 (H)  VLDL Latest Ref Range: 0.0 - 40.0 mg/dL 41.2 (H) 35.2 38.6     High Cholesterol  High cholesterol is a condition in which the blood has high levels of a white, waxy, fat-like substance (cholesterol). The human body needs small amounts of cholesterol. The liver makes all the cholesterol that the body needs. Extra (excess) cholesterol comes from the food that we eat. Cholesterol is carried from the liver by the blood through the blood vessels. If you have high cholesterol, deposits (plaques) may build up on the walls of your blood vessels (arteries). Plaques make the arteries narrower and stiffer. Cholesterol plaques increase your risk for heart attack and stroke. Work with your health care provider to keep your cholesterol levels in a healthy range. What increases the risk? This condition is more likely to develop in people who:  Eat foods that are high in animal fat (saturated fat) or cholesterol.  Are overweight.  Are not getting enough exercise.  Have a family history of high cholesterol. What are the signs or symptoms? There are no symptoms of this condition. How is this diagnosed? This condition may be diagnosed from the results of a blood test.  If you are older than age 36, your health care provider may check your cholesterol every 4-6 years.  You may be checked more often if you already have high cholesterol or other risk factors for heart disease. The blood test for cholesterol  measures:  "Bad" cholesterol (LDL cholesterol). This is the main type of cholesterol that causes heart disease. The desired level for LDL is less than 100.  "Good" cholesterol (HDL cholesterol). This type helps to protect against heart disease by cleaning the arteries and carrying the LDL away. The desired level for HDL is 60 or higher.  Triglycerides. These are fats that the body can store or burn for energy. The desired number for triglycerides is lower than 150.  Total cholesterol. This is a measure of the total amount of cholesterol in your blood, including LDL cholesterol, HDL cholesterol, and triglycerides. A healthy number is less than 200. How is this treated? This condition is treated with diet changes, lifestyle changes, and medicines. Diet changes  This may include eating more whole grains, fruits, vegetables, nuts, and fish.  This may also include cutting back on red meat and foods that have a lot of added sugar. Lifestyle changes  Changes may include getting at least 40 minutes of aerobic exercise 3 times a week. Aerobic exercises include walking, biking, and swimming. Aerobic exercise along with a healthy diet can help you maintain a healthy weight.  Changes may also include quitting smoking. Medicines  Medicines are usually given if diet and lifestyle changes have failed to reduce your cholesterol to healthy levels.  Your health care provider may prescribe a statin medicine. Statin medicines have been shown to reduce cholesterol, which can reduce the risk of heart disease. Follow these instructions at home: Eating and  drinking If told by your health care provider:  Eat chicken (without skin), fish, veal, shellfish, ground [place] breast, and round or loin cuts of red meat.  Do not eat fried foods or fatty meats, such as hot dogs and salami.  Eat plenty of fruits, such as apples.  Eat plenty of vegetables, such as broccoli, potatoes, and carrots.  Eat beans, peas,  and lentils.  Eat grains such as barley, [place], couscous, and bulgur wheat.  Eat pasta without cream sauces.  Use skim or nonfat milk, and eat low-fat or nonfat yogurt and cheeses.  Do not eat or drink whole milk, cream, ice cream, egg yolks, or hard cheeses.  Do not eat stick margarine or tub margarines that contain trans fats (also called partially hydrogenated oils).  Do not eat saturated tropical oils, such as coconut oil and palm oil.  Do not eat cakes, cookies, crackers, or other baked goods that contain trans fats.  General instructions  Exercise as directed by your health care provider. Increase your activity level with activities such as gardening, walking, and taking the stairs.  Take over-the-counter and prescription medicines only as told by your health care provider.  Do not use any products that contain nicotine or tobacco, such as cigarettes and e-cigarettes. If you need help quitting, ask your health care provider.  Keep all follow-up visits as told by your health care provider. This is important. Contact a health care provider if:  You are struggling to maintain a healthy diet or weight.  You need help to start on an exercise program.  You need help to stop smoking. Get help right away if:  You have chest pain.  You have trouble breathing. This information is not intended to replace advice given to you by your health care provider. Make sure you discuss any questions you have with your health care provider. Document Revised: 09/30/2017 Document Reviewed: 03/27/2016 Elsevier Patient Education  [place].  Cholesterol Content in Foods Cholesterol is a waxy, fat-like substance that helps to carry fat in the blood. The body needs cholesterol in small amounts, but too much cholesterol can cause damage to the arteries and heart. Most people should eat less than 200 milligrams (mg) of cholesterol a day. Foods with cholesterol  Cholesterol is found in  animal-based foods, such as meat, seafood, and dairy. Generally, low-fat dairy and lean meats have less cholesterol than full-fat dairy and fatty meats. The milligrams of cholesterol per serving (mg per serving) of common cholesterol-containing foods are listed below. Meat and other proteins  Egg -- one large whole egg has 186 mg.  Veal shank -- 4 oz has 141 mg.  Lean ground [place] (93% lean) -- 4 oz has 118 mg.  Fat-trimmed lamb loin -- 4 oz has 106 mg.  Lean ground beef (90% lean) -- 4 oz has 100 mg.  Lobster -- 3.5 oz has 90 mg.  Pork loin chops -- 4 oz has 86 mg.  Canned salmon -- 3.5 oz has 83 mg.  Fat-trimmed beef top loin -- 4 oz has 78 mg.  Frankfurter -- 1 frank (3.5 oz) has 77 mg.  Crab -- 3.5 oz has 71 mg.  Roasted chicken without skin, white meat -- 4 oz has 66 mg.  Light bologna -- 2 oz has 45 mg.  Deli-cut [place] -- 2 oz has 31 mg.  Canned tuna -- 3.5 oz has 31 mg.  Berniece Salines -- 1 oz has 29 mg.  Oysters and mussels (raw) --  3.5 oz has 25 mg.  Mackerel -- 1 oz has 22 mg.  Trout -- 1 oz has 20 mg.  Pork sausage -- 1 link (1 oz) has 17 mg.  Salmon -- 1 oz has 16 mg.  Tilapia -- 1 oz has 14 mg. Dairy  Soft-serve ice cream --  cup (4 oz) has 103 mg.  Whole-milk yogurt -- 1 cup (8 oz) has 29 mg.  Cheddar cheese -- 1 oz has 28 mg.  American cheese -- 1 oz has 28 mg.  Whole milk -- 1 cup (8 oz) has 23 mg.  2% milk -- 1 cup (8 oz) has 18 mg.  Cream cheese -- 1 tablespoon (Tbsp) has 15 mg.  Cottage cheese --  cup (4 oz) has 14 mg.  Low-fat (1%) milk -- 1 cup (8 oz) has 10 mg.  Sour cream -- 1 Tbsp has 8.5 mg.  Low-fat yogurt -- 1 cup (8 oz) has 8 mg.  Nonfat Greek yogurt -- 1 cup (8 oz) has 7 mg.  Half-and-half cream -- 1 Tbsp has 5 mg. Fats and oils  Cod liver oil -- 1 tablespoon (Tbsp) has 82 mg.  Butter -- 1 Tbsp has 15 mg.  Lard -- 1 Tbsp has 14 mg.  Bacon grease -- 1 Tbsp has 14 mg.  Mayonnaise -- 1 Tbsp has 5-10  mg.  Margarine -- 1 Tbsp has 3-10 mg. Exact amounts of cholesterol in these foods may vary depending on specific ingredients and brands. Foods without cholesterol Most plant-based foods do not have cholesterol unless you combine them with a food that has cholesterol. Foods without cholesterol include:  Grains and cereals.  Vegetables.  Fruits.  Vegetable oils, such as olive, canola, and sunflower oil.  Legumes, such as peas, beans, and lentils.  Nuts and seeds.  Egg whites. Summary  The body needs cholesterol in small amounts, but too much cholesterol can cause damage to the arteries and heart.  Most people should eat less than 200 milligrams (mg) of cholesterol a day. This information is not intended to replace advice given to you by your health care provider. Make sure you discuss any questions you have with your health care provider. Document Revised: 09/09/2017 Document Reviewed: 05/24/2017 Elsevier Patient Education  [place].  Hypercalcemia Hypercalcemia is when the level of calcium in a person's blood is above normal. The body needs calcium to make bones and keep them strong. Calcium also helps the muscles, nerves, brain, and heart work the way they should. Most of the calcium in the body is in the bones. There is also some calcium in the blood. Hypercalcemia can happen when calcium comes out of the bones, or when the kidneys are not able to remove calcium from the blood. Hypercalcemia can be mild or severe. What are the causes? There are many possible causes of hypercalcemia. Common causes of this condition include:  Hyperparathyroidism. This is a condition in which the body produces too much parathyroid hormone. There are four parathyroid glands in your neck. These glands produce a chemical messenger (hormone) that helps the body absorb calcium from foods and helps your bones release calcium.  Certain kinds of cancer. Less common causes of hypercalcemia  include:  Getting too much calcium or vitamin D from your diet.  Kidney failure.  Hyperthyroidism.  Severe dehydration.  Being on bed rest or being inactive for a long time.  Certain medicines.  Infections. What increases the risk? You are more likely to develop this  condition if you:  Are female.  Are 59 years of age or older.  Have a family history of hypercalcemia. What are the signs or symptoms? Mild hypercalcemia that starts slowly may not cause symptoms. Severe, sudden hypercalcemia is more likely to cause symptoms, such as:  Being more thirsty than usual.  Needing to urinate more often than usual.  Abdominal pain.  Nausea and vomiting.  Constipation.  Muscle pain, twitching, or weakness.  Feeling very tired. How is this diagnosed?  Hypercalcemia is usually diagnosed with a blood test. You may also have tests to help determine what is causing this condition, such as imaging tests and more blood tests. How is this treated? Treatment for hypercalcemia depends on the cause. Treatment may include:  Receiving fluids through an IV.  Medicines that: ? Keep calcium levels steady after receiving fluids (loop diuretics). ? Keep calcium in your bones (bisphosphonates). ? Lower the calcium level in your blood.  Surgery to remove overactive parathyroid glands.  A procedure that filters your blood to correct calcium levels (hemodialysis). Follow these instructions at home:   Take over-the-counter and prescription medicines only as told by your health care provider.  Follow instructions from your health care provider about eating or drinking restrictions.  Drink enough fluid to keep your urine pale yellow.  Stay active. Weight-bearing exercise helps to keep calcium in your bones. Follow instructions from your health care provider about what type and level of exercise is safe for you.  Keep all follow-up visits as told by your health care provider. This is  important. Contact a health care provider if you have:  A fever.  A heartbeat that is irregular or very fast.  Changes in mood, memory, or personality. Get help right away if you:  Have severe abdominal pain.  Have chest pain.  Have trouble breathing.  Become very confused and sleepy.  Lose consciousness. Summary  Hypercalcemia is when the level of calcium in a person's blood is above normal. The body needs calcium to make bones and keep them strong. Calcium also helps the muscles, nerves, brain, and heart work the way they should.  There are many possible causes of hypercalcemia, and treatment depends on the cause.  Take over-the-counter and prescription medicines only as told by your health care provider.  Follow instructions from your health care provider about eating or drinking restrictions. This information is not intended to replace advice given to you by your health care provider. Make sure you discuss any questions you have with your health care provider. Document Revised: 10/24/2018 Document Reviewed: 07/03/2018 Elsevier Patient Education  [place].    Hypothyroidism  Hypothyroidism is when the thyroid gland does not make enough of certain hormones (it is underactive). The thyroid gland is a small gland located in the lower front part of the neck, just in front of the windpipe (trachea). This gland makes hormones that help control how the body uses food for energy (metabolism) as well as how the heart and brain function. These hormones also play a role in keeping your bones strong. When the thyroid is underactive, it produces too little of the hormones thyroxine (T4) and triiodothyronine (T3). What are the causes? This condition may be caused by:  Hashimoto's disease. This is a disease in which the body's disease-fighting system (immune system) attacks the thyroid gland. This is the most common cause.  Viral infections.  Pregnancy.  Certain  medicines.  Birth defects.  Past radiation treatments to the head or  neck for cancer.  Past treatment with radioactive iodine.  Past exposure to radiation in the environment.  Past surgical removal of part or all of the thyroid.  Problems with a gland in the center of the brain (pituitary gland).  Lack of enough iodine in the diet. What increases the risk? You are more likely to develop this condition if:  You are female.  You have a family history of thyroid conditions.  You use a medicine called lithium.  You take medicines that affect the immune system (immunosuppressants). What are the signs or symptoms? Symptoms of this condition include:  Feeling as though you have no energy (lethargy).  Not being able to tolerate cold.  Weight gain that is not explained by a change in diet or exercise habits.  Lack of appetite.  Dry skin.  Coarse hair.  Menstrual irregularity.  Slowing of thought processes.  Constipation.  Sadness or depression. How is this diagnosed? This condition may be diagnosed based on:  Your symptoms, your medical history, and a physical exam.  Blood tests. You may also have imaging tests, such as an ultrasound or MRI. How is this treated? This condition is treated with medicine that replaces the thyroid hormones that your body does not make. After you begin treatment, it may take several weeks for symptoms to go away. Follow these instructions at home:  Take over-the-counter and prescription medicines only as told by your health care provider.  If you start taking any new medicines, tell your health care provider.  Keep all follow-up visits as told by your health care provider. This is important. ? As your condition improves, your dosage of thyroid hormone medicine may change. ? You will need to have blood tests regularly so that your health care provider can monitor your condition. Contact a health care provider if:  Your symptoms do  not get better with treatment.  You are taking thyroid replacement medicine and you: ? Sweat a lot. ? Have tremors. ? Feel anxious. ? Lose weight rapidly. ? Cannot tolerate heat. ? Have emotional swings. ? Have diarrhea. ? Feel weak. Get help right away if you have:  Chest pain.  An irregular heartbeat.  A rapid heartbeat.  Difficulty breathing. Summary  Hypothyroidism is when the thyroid gland does not make enough of certain hormones (it is underactive).  When the thyroid is underactive, it produces too little of the hormones thyroxine (T4) and triiodothyronine (T3).  The most common cause is Hashimoto's disease, a disease in which the body's disease-fighting system (immune system) attacks the thyroid gland. The condition can also be caused by viral infections, medicine, pregnancy, or past radiation treatment to the head or neck.  Symptoms may include weight gain, dry skin, constipation, feeling as though you do not have energy, and not being able to tolerate cold.  This condition is treated with medicine to replace the thyroid hormones that your body does not make. This information is not intended to replace advice given to you by your health care provider. Make sure you discuss any questions you have with your health care provider. Document Revised: 09/09/2017 Document Reviewed: 09/07/2017 Elsevier Patient Education  2020 [employer].

## 2020-07-09 NOTE — Progress Notes (Signed)
Chief Complaint  Patient presents with  . Follow-up   F/u  1. Elevated TSH 6.99 increased but Ft4, ft3, tpo Abs normal wants to hold on levothyroxine med and repeat in 6-8 weeks  2. Elevated calcium she is not taking extra calcium otc 3. C/o LLE numbness for a while denies low back pain  4. Overactive bladder on myrbetriq 25 helps some but still has urinary urgency at times will increase dose today    Review of Systems  Constitutional: Negative for weight loss.  HENT: Negative for hearing loss.   Eyes: Negative for blurred vision.  Respiratory: Negative for shortness of breath.   Cardiovascular: Negative for chest pain.  Gastrointestinal: Negative for abdominal pain.  Musculoskeletal: Negative for back pain.  Skin: Negative for rash.  Neurological: Positive for sensory change.       Numbness LLE  Psychiatric/Behavioral: Negative for depression.   Past Medical History:  Diagnosis Date  . Arthritis   . Carotid stenosis    b/l w/o sx's   . Fibromyalgia   . Fibromyalgia   . Gastric ulcer    1970s  . GERD (gastroesophageal reflux disease)   . History of colon polyps   . History of UTI   . Hyperlipidemia   . Hypertension   . Inflammatory polyps of colon ([place])    08/2008 OK 08/2010 Ok  byk Dr Earlean Shawl  . Pneumonia 12/2011  . Prediabetes   . Ulcer   . UTI (urinary tract infection)   . Vitamin D deficiency    Past Surgical History:  Procedure Laterality Date  . CHOLECYSTECTOMY N/A 01/07/2019   Procedure: LAPAROSCOPIC CHOLECYSTECTOMY;  Surgeon: Ralene Ok, MD;  Location: [facility];  Service: General;  Laterality: N/A;  . HERNIA REPAIR  1978   Family History  Problem Relation Age of Onset  . Hyperlipidemia Mother   . Alzheimer's disease Mother   . Cancer Mother        breast cancer dx'ed 14 y.o   . Depression Mother   . Miscarriages / [place] Mother   . Colon polyps Mother   . Diabetes Father   . Heart disease Father        CABG x 7   . Hyperlipidemia Father    . Mental illness Father   . Alzheimer's disease Father   . Depression Father   . Early death Father   . Colon polyps Father   . Cancer Paternal Grandmother        colon  . Diabetes Paternal Grandmother   . Heart disease Cousin        sudden death less than 48 yrs old heart attack.  . Deep vein thrombosis Maternal Grandmother   . Cancer Maternal Grandfather        throat smoker   Social History   Socioeconomic History  . Marital status: Married    Spouse name: Not on file  . Number of children: Not on file  . Years of education: Not on file  . Highest education level: Not on file  Occupational History  . Not on file  Tobacco Use  . Smoking status: Former Smoker    Quit date: 10/11/1980    Years since quitting: 39.7  . Smokeless tobacco: Never Used  Substance and Sexual Activity  . Alcohol use: No    Alcohol/week: 0.0 standard drinks  . Drug use: No  . Sexual activity: Yes  Other Topics Concern  . Not on file  Social History Narrative   No  guns, wears seat belt    Safe in relationship   Married    2 kids (boy and girl) and 3 grands   Retired Armed forces operational officer, some college    Social Determinants of Radio broadcast assistant Strain:   . Difficulty of Paying Living Expenses: Not on file  Food Insecurity:   . Worried About Charity fundraiser in the Last Year: Not on file  . Ran Out of Food in the Last Year: Not on file  Transportation Needs:   . Lack of Transportation (Medical): Not on file  . Lack of Transportation (Non-Medical): Not on file  Physical Activity:   . Days of Exercise per Week: Not on file  . Minutes of Exercise per Session: Not on file  Stress:   . Feeling of Stress : Not on file  Social Connections:   . Frequency of Communication with Friends and Family: Not on file  . Frequency of Social Gatherings with Friends and Family: Not on file  . Attends Religious Services: Not on file  . Active Member of Clubs or Organizations: Not on file  .  Attends Archivist Meetings: Not on file  . Marital Status: Not on file  Intimate Partner Violence:   . Fear of Current or Ex-Partner: Not on file  . Emotionally Abused: Not on file  . Physically Abused: Not on file  . Sexually Abused: Not on file   Current Meds  Medication Sig  . amitriptyline (ELAVIL) 25 MG tablet Take 1 tablet (25 mg total) by mouth at bedtime.  . colestipol (COLESTID) 1 g tablet Take 2 tablets (2 g total) by mouth daily.  . fenofibrate micronized (ANTARA) 130 MG capsule Take 1 capsule (130 mg total) by mouth daily before breakfast.  . furosemide (LASIX) 20 MG tablet Take 1 tablet (20 mg total) by mouth daily as needed.  . mirabegron ER (MYRBETRIQ) 50 MG TB24 tablet Take 1 tablet (50 mg total) by mouth daily.  Marland Kitchen olmesartan (BENICAR) 20 MG tablet Take 1 tablet (20 mg total) by mouth daily.  . rosuvastatin (CRESTOR) 10 MG tablet Take 1 tablet (10 mg total) by mouth at bedtime.  . TURMERIC PO Take by mouth in the morning, at noon, and at bedtime.  . [DISCONTINUED] mirabegron ER (MYRBETRIQ) 25 MG TB24 tablet Take 1 tablet (25 mg total) by mouth daily.   Allergies  Allergen Reactions  . Atorvastatin Other (See Comments)  . Norvasc [Amlodipine Besylate]     Leg swelling   . Simvastatin Other (See Comments)  . Zetia [Ezetimibe] Other (See Comments)    Muscle aches!!!   Recent Results (from the past 2160 hour(s))  Urinalysis, Routine w reflex microscopic     Status: None   Collection Time: 07/02/20  9:16 AM  Result Value Ref Range   Color, Urine YELLOW YELLOW   APPearance CLEAR CLEAR   Specific Gravity, Urine 1.008 1.001 - 1.03   pH 6.0 5.0 - 8.0   Glucose, UA NEGATIVE NEGATIVE   Bilirubin Urine NEGATIVE NEGATIVE   Ketones, ur NEGATIVE NEGATIVE   Hgb urine dipstick NEGATIVE NEGATIVE   Protein, ur NEGATIVE NEGATIVE   Nitrite NEGATIVE NEGATIVE   Leukocytes,Ua NEGATIVE NEGATIVE  TSH     Status: Abnormal   Collection Time: 07/02/20  9:16 AM  Result  Value Ref Range   TSH 6.99 (H) 0.35 - 4.50 uIU/mL  CBC w/Diff     Status: Abnormal   Collection Time: 07/02/20  9:16 AM  Result Value Ref Range   WBC 5.3 4.0 - 10.5 K/uL   RBC 4.38 3.87 - 5.11 Mil/uL   Hemoglobin 13.3 12.0 - 15.0 g/dL   HCT 38.6 36 - 46 %   MCV 88.2 78.0 - 100.0 fl   MCHC 34.5 30.0 - 36.0 g/dL   RDW 12.9 11.5 - 15.5 %   Platelets 338.0 150 - 400 K/uL   Neutrophils Relative % 40.3 (L) 43 - 77 %   Lymphocytes Relative 45.3 12 - 46 %   Monocytes Relative 11.4 3 - 12 %   Eosinophils Relative 2.6 0 - 5 %   Basophils Relative 0.4 0 - 3 %   Neutro Abs 2.1 1.4 - 7.7 K/uL   Lymphs Abs 2.4 0.7 - 4.0 K/uL   Monocytes Absolute 0.6 0 - 1 K/uL   Eosinophils Absolute 0.1 0 - 0 K/uL   Basophils Absolute 0.0 0 - 0 K/uL  Lipid panel     Status: Abnormal   Collection Time: 07/02/20  9:16 AM  Result Value Ref Range   Cholesterol 194 0 - 200 mg/dL    Comment: ATP III Classification       Desirable:  < 200 mg/dL               Borderline High:  200 - 239 mg/dL          High:  > = 240 mg/dL   Triglycerides 193.0 (H) 0 - 149 mg/dL    Comment: Normal:  <150 mg/dLBorderline High:  150 - 199 mg/dL   HDL 46.30 >39.00 mg/dL   VLDL 38.6 0.0 - 40.0 mg/dL   LDL Cholesterol 109 (H) 0 - 99 mg/dL   Total CHOL/HDL Ratio 4     Comment:                Men          Women1/2 Average Risk     3.4          3.3Average Risk          5.0          4.42X Average Risk          9.6          7.13X Average Risk          15.0          11.0                       NonHDL 147.93     Comment: NOTE:  Non-HDL goal should be 30 mg/dL higher than patient's LDL goal (i.e. LDL goal of < 70 mg/dL, would have non-HDL goal of < 100 mg/dL)  Comprehensive metabolic panel     Status: Abnormal   Collection Time: 07/02/20  9:16 AM  Result Value Ref Range   Sodium 139 135 - 145 mEq/L   Potassium 4.6 3.5 - 5.1 mEq/L   Chloride 103 96 - 112 mEq/L   CO2 27 19 - 32 mEq/L   Glucose, Bld 96 70 - 99 mg/dL   BUN 17 6 - 23 mg/dL    Creatinine, Ser 1.02 0.40 - 1.20 mg/dL   Total Bilirubin 0.9 0.2 - 1.2 mg/dL   Alkaline Phosphatase 37 (L) 39 - 117 U/L   AST 18 0 - 37 U/L   ALT 18 0 - 35 U/L   Total Protein 7.5 6.0 - 8.3 g/dL   Albumin 4.8 3.5 - 5.2  g/dL   GFR 53.63 (L) >60.00 mL/min   Calcium 10.7 (H) 8.4 - 10.5 mg/dL  T3, free     Status: None   Collection Time: 07/04/20  4:47 PM  Result Value Ref Range   T3, Free 3.3 2.3 - 4.2 pg/mL  T4, free     Status: None   Collection Time: 07/04/20  4:47 PM  Result Value Ref Range   Free T4 0.76 0.60 - 1.60 ng/dL    Comment: Specimens from patients who are undergoing biotin therapy and /or ingesting biotin supplements may contain high levels of biotin.  The higher biotin concentration in these specimens interferes with this Free T4 assay.  Specimens that contain high levels  of biotin may cause false high results for this Free T4 assay.  Please interpret results in light of the total clinical presentation of the patient.    Thyroid peroxidase antibody     Status: None   Collection Time: 07/04/20  4:47 PM  Result Value Ref Range   Thyroperoxidase Ab SerPl-aCnc <1 <9 IU/mL   Objective  Body mass index is 31.25 kg/m. Wt Readings from Last 3 Encounters:  07/09/20 193 lb 9.6 oz (87.8 kg)  01/02/20 195 lb 12.8 oz (88.8 kg)  09/26/19 196 lb (88.9 kg)   Temp Readings from Last 3 Encounters:  07/09/20 97.8 F (36.6 C) (Oral)  01/02/20 98.1 F (36.7 C) (Temporal)  10/23/19 (!) 95.5 F (35.3 C) (Temporal)   BP Readings from Last 3 Encounters:  07/09/20 126/78  01/02/20 130/78  01/08/19 (!) 142/84   Pulse Readings from Last 3 Encounters:  07/09/20 91  01/02/20 82  01/08/19 82    Physical Exam Vitals and nursing note reviewed.  Constitutional:      Appearance: Normal appearance. She is well-developed and well-groomed. She is obese.  HENT:     Head: Normocephalic and atraumatic.  Eyes:     Conjunctiva/sclera: Conjunctivae normal.     Pupils: Pupils are equal,  round, and reactive to light.  Cardiovascular:     Rate and Rhythm: Normal rate and regular rhythm.     Heart sounds: Normal heart sounds. No murmur heard.   Pulmonary:     Effort: Pulmonary effort is normal.     Breath sounds: Normal breath sounds.  Abdominal:     General: Abdomen is flat. Bowel sounds are normal.  Skin:    General: Skin is warm and dry.  Neurological:     General: No focal deficit present.     Mental Status: She is alert and oriented to person, place, and time. Mental status is at baseline.     Gait: Gait normal.  Psychiatric:        Attention and Perception: Attention and perception normal.        Mood and Affect: Mood and affect normal.        Speech: Speech normal.        Behavior: Behavior normal. Behavior is cooperative.        Thought Content: Thought content normal.        Cognition and Memory: Cognition and memory normal.        Judgment: Judgment normal.     Assessment  Plan  Hypercalcemia - Plan: Protein Electrophoresis, (serum), Protein Electrophoresis, Urine Rflx., PTH, Intact and Calcium, Calcium, ionized, Vitamin D (25 hydroxy), Cortisol-am, blood, PTH-Related Peptide  Estrogen deficiency - Plan: DG Bone Density [facility] 10/14/20   Osteopenia, unspecified location - Plan: DG Bone Density  Overactive  bladder - Plan: mirabegron ER (MYRBETRIQ) 50 MG TB24 tablet inc from 25  Elevated TSH - Plan: TSH Consider levo 25 mcg at f/u if still elevated [place] no significant nodules right thyroid no need FNA or [place] f/u 2021  Facet arthritis, degenerative, lumbar spine Left leg numbness Consider EMG/NCS with neurology in future declines for now  Obesity (BMI 30-39.9)   HM Flu shotutd shingrix2/2 prevnar utd pna 23 due in future if not had  tdap utd 2021 covid vx 2/2 disc booster  Pap negative 05/10/16 neg HPVwill hold paps for now out of age window Mammogram [facility] get copy release signedhad 09/29/18 negative 10/15/19 mammo neg [facility] negative  DEXA  10/06/17 osteopenia  -ordered mammo and dexa 10/14/20 [facility]  Colonoscopy 09/25/18 h/o polyps per pt get copy from Dr. Earlean Shawl in [place] colon polyps/cancer  08/28/15 [facility] medium colonoscopy repeat in 5 years Dr. Earlean Shawl no report from 09/25/18 -sent message 07/09/20 when due   DEXA 09/21/17 osteopenia rec vitamin D3 50K IU weekly then 5000 IU daily  Dermatology9/2020 Dr. Isenstein3/24/21  D3 Maudry Mayhew Iu qd  former PCP Dr. Candiss Norse  Provider: Dr. Olivia Mackie McLean-Scocuzza-[facility]

## 2020-07-14 NOTE — Progress Notes (Signed)
Faxed a release for colonoscopy report from [facility].   Awaiting for a fax back.  Anyi Fels,cma

## 2020-07-29 ENCOUNTER — Other Ambulatory Visit: Payer: Self-pay

## 2020-07-29 ENCOUNTER — Ambulatory Visit (INDEPENDENT_AMBULATORY_CARE_PROVIDER_SITE_OTHER): Payer: 59

## 2020-07-29 DIAGNOSIS — E2839 Other primary ovarian failure: Secondary | ICD-10-CM

## 2020-07-29 DIAGNOSIS — E538 Deficiency of other specified B group vitamins: Secondary | ICD-10-CM | POA: Diagnosis not present

## 2020-07-29 MED ORDER — CYANOCOBALAMIN 1000 MCG/ML IJ SOLN
1000.0000 ug | Freq: Once | INTRAMUSCULAR | Status: AC
  Administered 2020-07-29: 1000 ug via INTRAMUSCULAR

## 2020-07-29 NOTE — Progress Notes (Signed)
Patient presented for B 12 injection to right deltoid, patient voiced no concerns nor showed any signs of distress during injection. 

## 2020-09-01 ENCOUNTER — Other Ambulatory Visit: Payer: 59

## 2020-09-01 ENCOUNTER — Encounter: Payer: Self-pay | Admitting: Internal Medicine

## 2020-09-02 ENCOUNTER — Other Ambulatory Visit (INDEPENDENT_AMBULATORY_CARE_PROVIDER_SITE_OTHER): Payer: 59

## 2020-09-02 ENCOUNTER — Other Ambulatory Visit: Payer: Self-pay

## 2020-09-02 ENCOUNTER — Telehealth: Payer: Self-pay | Admitting: Internal Medicine

## 2020-09-02 DIAGNOSIS — R7989 Other specified abnormal findings of blood chemistry: Secondary | ICD-10-CM | POA: Diagnosis not present

## 2020-09-02 NOTE — Telephone Encounter (Signed)
Prior authorization has been submitted for patient's Myrbetriq 50 mg   Awaiting approval or denial.

## 2020-09-03 LAB — VITAMIN D 25 HYDROXY (VIT D DEFICIENCY, FRACTURES): VITD: 27 ng/mL — ABNORMAL LOW (ref 30.00–100.00)

## 2020-09-03 LAB — TSH: TSH: 4.08 u[IU]/mL (ref 0.35–4.50)

## 2020-09-04 LAB — PROTEIN ELECTROPHORESIS, URINE REFLEX
Albumin ELP, Urine: 0 %
Alpha-1-Globulin, U: 0 %
Alpha-2-Globulin, U: 0 %
Beta Globulin, U: 0 %
Gamma Globulin, U: 0 %
Protein, Ur: 4.1 mg/dL

## 2020-09-10 ENCOUNTER — Other Ambulatory Visit: Payer: Self-pay

## 2020-09-10 ENCOUNTER — Encounter: Payer: Self-pay | Admitting: Internal Medicine

## 2020-09-10 ENCOUNTER — Ambulatory Visit: Payer: 59 | Admitting: Internal Medicine

## 2020-09-10 VITALS — BP 122/76 | HR 91 | Temp 98.2°F | Ht 66.0 in | Wt 192.8 lb

## 2020-09-10 DIAGNOSIS — E785 Hyperlipidemia, unspecified: Secondary | ICD-10-CM

## 2020-09-10 DIAGNOSIS — N3281 Overactive bladder: Secondary | ICD-10-CM

## 2020-09-10 DIAGNOSIS — R946 Abnormal results of thyroid function studies: Secondary | ICD-10-CM | POA: Diagnosis not present

## 2020-09-10 DIAGNOSIS — E559 Vitamin D deficiency, unspecified: Secondary | ICD-10-CM

## 2020-09-10 DIAGNOSIS — E538 Deficiency of other specified B group vitamins: Secondary | ICD-10-CM | POA: Diagnosis not present

## 2020-09-10 DIAGNOSIS — R7989 Other specified abnormal findings of blood chemistry: Secondary | ICD-10-CM

## 2020-09-10 MED ORDER — CYANOCOBALAMIN 1000 MCG/ML IJ SOLN
1000.0000 ug | Freq: Once | INTRAMUSCULAR | Status: AC
  Administered 2020-09-10: 1000 ug via INTRAMUSCULAR

## 2020-09-10 MED ORDER — ROSUVASTATIN CALCIUM 5 MG PO TABS: 5.0000 mg | ORAL_TABLET | Freq: Every day | ORAL | 3 refills | Status: DC

## 2020-09-10 NOTE — Progress Notes (Signed)
Chief Complaint  Patient presents with  . Follow-up  . B12 Injection   F/u 1. Elevated TSH >6 now normal  Results for Beth Fisher, Beth Fisher (MRN [id number]) as of 09/10/2020 12:45  12/26/2019 09:20 TSH: 6.43 (H)  07/02/2020 09:16 TSH: 6.99 (H)  09/02/2020 15:12 TSH: 4.08  2. 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 B12 def given shot today and will continue with QOMonth shots  4. Overactive bladder consider GYN as myrbetriq 50 is not helping pt will wait for now on referral  Review of Systems  Constitutional: Negative for weight loss.  HENT: Negative for hearing loss.   Eyes: Negative for blurred vision.  Respiratory: Negative for shortness of breath.   Cardiovascular: Negative for chest pain.  Gastrointestinal: Negative for abdominal pain.  Musculoskeletal: Negative for falls.  Skin: Negative for rash.  Neurological: Negative for headaches.  Psychiatric/Behavioral: Negative for depression.   Past Medical History:  Diagnosis Date  . Arthritis   . Carotid stenosis    b/l w/o sx's   . Fibromyalgia   . Fibromyalgia   . Gastric ulcer    1970s  . GERD (gastroesophageal reflux disease)   . History of colon polyps   . History of UTI   . Hyperlipidemia   . Hypertension   . Inflammatory polyps of colon ([place])    08/2008 OK 08/2010 Ok  byk Dr Earlean Shawl  . Pneumonia 12/2011  . Prediabetes   . Ulcer   . UTI (urinary tract infection)   . Vitamin D deficiency    Past Surgical History:  Procedure Laterality Date  . CHOLECYSTECTOMY N/A 01/07/2019   Procedure: LAPAROSCOPIC CHOLECYSTECTOMY;  Surgeon: Ralene Ok, MD;  Location: [facility];  Service: General;  Laterality: N/A;  . HERNIA REPAIR  1978   Family History  Problem Relation Age of Onset  . Hyperlipidemia Mother   . Alzheimer's disease Mother   . Cancer Mother        breast cancer dx'ed 20 y.o   . Depression Mother   . Miscarriages / [place] Mother   . Colon polyps  Mother   . Diabetes Father   . Heart disease Father        CABG x 7   . Hyperlipidemia Father   . Mental illness Father   . Alzheimer's disease Father   . Depression Father   . Early death Father   . Colon polyps Father   . Cancer Paternal Grandmother        colon  . Diabetes Paternal Grandmother   . Heart disease Cousin        sudden death less than 42 yrs old heart attack.  . Deep vein thrombosis Maternal Grandmother   . Cancer Maternal Grandfather        throat smoker  . Ovarian cysts Granddaughter        ? cancer 44 y.o as of 09/10/20   Social History   Socioeconomic History  . Marital status: Married    Spouse name: Not on file  . Number of children: Not on file  . Years of education: Not on file  . Highest education level: Not on file  Occupational History  . Not on file  Tobacco Use  . Smoking status: Former Smoker    Quit date: 10/11/1980    Years since quitting: 39.9  . Smokeless tobacco: Never Used  Substance and Sexual Activity  . Alcohol use: No  Alcohol/week: 0.0 standard drinks  . Drug use: No  . Sexual activity: Yes  Other Topics Concern  . Not on file  Social History Narrative   No guns, wears seat belt    Safe in relationship   Married    2 kids (boy and girl) and 3 grands   Retired Armed forces operational officer, some college    Social Determinants of Radio broadcast assistant Strain:   . Difficulty of Paying Living Expenses: Not on file  Food Insecurity:   . Worried About Charity fundraiser in the Last Year: Not on file  . Ran Out of Food in the Last Year: Not on file  Transportation Needs:   . Lack of Transportation (Medical): Not on file  . Lack of Transportation (Non-Medical): Not on file  Physical Activity:   . Days of Exercise per Week: Not on file  . Minutes of Exercise per Session: Not on file  Stress:   . Feeling of Stress : Not on file  Social Connections:   . Frequency of Communication with Friends and Family: Not on file  . Frequency  of Social Gatherings with Friends and Family: Not on file  . Attends Religious Services: Not on file  . Active Member of Clubs or Organizations: Not on file  . Attends Archivist Meetings: Not on file  . Marital Status: Not on file  Intimate Partner Violence:   . Fear of Current or Ex-Partner: Not on file  . Emotionally Abused: Not on file  . Physically Abused: Not on file  . Sexually Abused: Not on file   Current Meds  Medication Sig  . amitriptyline (ELAVIL) 25 MG tablet Take 1 tablet (25 mg total) by mouth at bedtime.  . colestipol (COLESTID) 1 g tablet Take 2 tablets (2 g total) by mouth daily.  . fenofibrate micronized (ANTARA) 130 MG capsule Take 1 capsule (130 mg total) by mouth daily before breakfast.  . furosemide (LASIX) 20 MG tablet Take 1 tablet (20 mg total) by mouth daily as needed.  . mirabegron ER (MYRBETRIQ) 50 MG TB24 tablet Take 1 tablet (50 mg total) by mouth daily.  Marland Kitchen olmesartan (BENICAR) 20 MG tablet Take 1 tablet (20 mg total) by mouth daily.  . rosuvastatin (CRESTOR) 5 MG tablet Take 1 tablet (5 mg total) by mouth at bedtime.  . TURMERIC PO Take by mouth in the morning, at noon, and at bedtime.  . [DISCONTINUED] rosuvastatin (CRESTOR) 10 MG tablet Take 1 tablet (10 mg total) by mouth at bedtime. (Patient taking differently: Take 5 mg by mouth at bedtime. )   Allergies  Allergen Reactions  . Atorvastatin Other (See Comments)  . Norvasc [Amlodipine Besylate]     Leg swelling   . Simvastatin Other (See Comments)  . Zetia [Ezetimibe] Other (See Comments)    Muscle aches!!!   Recent Results (from the past 2160 hour(s))  Urinalysis, Routine w reflex microscopic     Status: None   Collection Time: 07/02/20  9:16 AM  Result Value Ref Range   Color, Urine YELLOW YELLOW   APPearance CLEAR CLEAR   Specific Gravity, Urine 1.008 1.001 - 1.03   pH 6.0 5.0 - 8.0   Glucose, UA NEGATIVE NEGATIVE   Bilirubin Urine NEGATIVE NEGATIVE   Ketones, ur NEGATIVE  NEGATIVE   Hgb urine dipstick NEGATIVE NEGATIVE   Protein, ur NEGATIVE NEGATIVE   Nitrite NEGATIVE NEGATIVE   Leukocytes,Ua NEGATIVE NEGATIVE  TSH     Status:  Abnormal   Collection Time: 07/02/20  9:16 AM  Result Value Ref Range   TSH 6.99 (H) 0.35 - 4.50 uIU/mL  CBC w/Diff     Status: Abnormal   Collection Time: 07/02/20  9:16 AM  Result Value Ref Range   WBC 5.3 4.0 - 10.5 K/uL   RBC 4.38 3.87 - 5.11 Mil/uL   Hemoglobin 13.3 12.0 - 15.0 g/dL   HCT 38.6 36 - 46 %   MCV 88.2 78.0 - 100.0 fl   MCHC 34.5 30.0 - 36.0 g/dL   RDW 12.9 11.5 - 15.5 %   Platelets 338.0 150 - 400 K/uL   Neutrophils Relative % 40.3 (L) 43 - 77 %   Lymphocytes Relative 45.3 12 - 46 %   Monocytes Relative 11.4 3 - 12 %   Eosinophils Relative 2.6 0 - 5 %   Basophils Relative 0.4 0 - 3 %   Neutro Abs 2.1 1.4 - 7.7 K/uL   Lymphs Abs 2.4 0.7 - 4.0 K/uL   Monocytes Absolute 0.6 0.1 - 1.0 K/uL   Eosinophils Absolute 0.1 0.0 - 0.7 K/uL   Basophils Absolute 0.0 0.0 - 0.1 K/uL  Lipid panel     Status: Abnormal   Collection Time: 07/02/20  9:16 AM  Result Value Ref Range   Cholesterol 194 0 - 200 mg/dL    Comment: ATP III Classification       Desirable:  < 200 mg/dL               Borderline High:  200 - 239 mg/dL          High:  > = 240 mg/dL   Triglycerides 193.0 (H) 0 - 149 mg/dL    Comment: Normal:  <150 mg/dLBorderline High:  150 - 199 mg/dL   HDL 46.30 >39.00 mg/dL   VLDL 38.6 0.0 - 40.0 mg/dL   LDL Cholesterol 109 (H) 0 - 99 mg/dL   Total CHOL/HDL Ratio 4     Comment:                Men          Women1/2 Average Risk     3.4          3.3Average Risk          5.0          4.42X Average Risk          9.6          7.13X Average Risk          15.0          11.0                       NonHDL 147.93     Comment: NOTE:  Non-HDL goal should be 30 mg/dL higher than patient's LDL goal (i.e. LDL goal of < 70 mg/dL, would have non-HDL goal of < 100 mg/dL)  Comprehensive metabolic panel     Status: Abnormal    Collection Time: 07/02/20  9:16 AM  Result Value Ref Range   Sodium 139 135 - 145 mEq/L   Potassium 4.6 3.5 - 5.1 mEq/L   Chloride 103 96 - 112 mEq/L   CO2 27 19 - 32 mEq/L   Glucose, Bld 96 70 - 99 mg/dL   BUN 17 6 - 23 mg/dL   Creatinine, Ser 1.02 0.40 - 1.20 mg/dL   Total Bilirubin 0.9 0.2 - 1.2 mg/dL  Alkaline Phosphatase 37 (L) 39 - 117 U/L   AST 18 0 - 37 U/L   ALT 18 0 - 35 U/L   Total Protein 7.5 6.0 - 8.3 g/dL   Albumin 4.8 3.5 - 5.2 g/dL   GFR 53.63 (L) >60.00 mL/min   Calcium 10.7 (H) 8.4 - 10.5 mg/dL  T3, free     Status: None   Collection Time: 07/04/20  4:47 PM  Result Value Ref Range   T3, Free 3.3 2.3 - 4.2 pg/mL  T4, free     Status: None   Collection Time: 07/04/20  4:47 PM  Result Value Ref Range   Free T4 0.76 0.60 - 1.60 ng/dL    Comment: Specimens from patients who are undergoing biotin therapy and /or ingesting biotin supplements may contain high levels of biotin.  The higher biotin concentration in these specimens interferes with this Free T4 assay.  Specimens that contain high levels  of biotin may cause false high results for this Free T4 assay.  Please interpret results in light of the total clinical presentation of the patient.    Thyroid peroxidase antibody     Status: None   Collection Time: 07/04/20  4:47 PM  Result Value Ref Range   Thyroperoxidase Ab SerPl-aCnc <1 <9 IU/mL  PTH-Related Peptide     Status: None (Preliminary result)   Collection Time: 09/02/20  3:12 PM  Result Value Ref Range   PTH-related peptide WILL FOLLOW   Cortisol-am, blood     Status: None   Collection Time: 09/02/20  3:12 PM  Result Value Ref Range   Cortisol - AM 8.6 6.2 - 19.4 ug/dL  Vitamin D (25 hydroxy)     Status: Abnormal   Collection Time: 09/02/20  3:12 PM  Result Value Ref Range   VITD 27.00 (L) 30.00 - 100.00 ng/mL  TSH     Status: None   Collection Time: 09/02/20  3:12 PM  Result Value Ref Range   TSH 4.08 0.35 - 4.50 uIU/mL  Calcium, ionized      Status: None   Collection Time: 09/02/20  3:12 PM  Result Value Ref Range   Calcium, Ion 5.6 4.5 - 5.6 mg/dL  PTH, Intact and Calcium     Status: Abnormal   Collection Time: 09/02/20  3:12 PM  Result Value Ref Range   Calcium 10.7 (H) 8.7 - 10.3 mg/dL   PTH 14 (L) 15 - 65 pg/mL   PTH Interp Comment     Comment: Interpretation                 Intact PTH    Calcium                                 (pg/mL)      (mg/dL) Normal                          15 - 65     8.6 - 10.2 Primary Hyperparathyroidism         >65          >10.2 Secondary Hyperparathyroidism       >65          <10.2 Non-Parathyroid Hypercalcemia       <65          >10.2 Hypoparathyroidism                  <  15          < 8.6 Non-Parathyroid Hypocalcemia    15 - 65          < 8.6   Protein Electrophoresis, (serum)     Status: None   Collection Time: 09/02/20  3:12 PM  Result Value Ref Range   Total Protein 7.4 6.0 - 8.5 g/dL   Albumin ELP 4.1 2.9 - 4.4 g/dL   Alpha 1 0.3 0.0 - 0.4 g/dL   Alpha 2 0.9 0.4 - 1.0 g/dL   Beta 1.2 0.7 - 1.3 g/dL   Gamma Globulin 1.0 0.4 - 1.8 g/dL   M-Spike, % Not Observed Not Observed g/dL   Globulin, Total 3.3 2.2 - 3.9 g/dL   A/G Ratio 1.2 0.7 - 1.7   Please Note: Comment     Comment: Protein electrophoresis scan will follow via computer, mail, or courier delivery.    Interpretation: Comment     Comment: The SPE pattern appears unremarkable. Evidence of monoclonal protein is not apparent.   Protein Electrophoresis, Urine Rflx.     Status: None   Collection Time: 09/02/20  3:13 PM  Result Value Ref Range   Protein, Ur 4.1 Not Estab. mg/dL   Albumin ELP, Urine 0.0 %    Comment: No urine protein electrophoretic pattern was observed after concentration. However, there is a detectable amount of protein by a quantitative spectrophotometric method.    Alpha-1-Globulin, U 0.0 %   Alpha-2-Globulin, U 0.0 %   Beta Globulin, U 0.0 %   Gamma Globulin, U 0.0 %   M Component, Ur Not  Observed Not Observed %   Please Note: Comment     Comment: Protein electrophoresis scan will follow via computer, mail, or courier delivery.    Objective  Body mass index is 31.12 kg/m. Wt Readings from Last 3 Encounters:  09/10/20 192 lb 12.8 oz (87.5 kg)  07/09/20 193 lb 9.6 oz (87.8 kg)  01/02/20 195 lb 12.8 oz (88.8 kg)   Temp Readings from Last 3 Encounters:  09/10/20 98.2 F (36.8 C) (Oral)  07/09/20 97.8 F (36.6 C) (Oral)  01/02/20 98.1 F (36.7 C) (Temporal)   BP Readings from Last 3 Encounters:  09/10/20 122/76  07/09/20 126/78  01/02/20 130/78   Pulse Readings from Last 3 Encounters:  09/10/20 91  07/09/20 91  01/02/20 82    Physical Exam Vitals and nursing note reviewed.  Constitutional:      Appearance: Normal appearance. She is well-developed and well-groomed. She is obese.  HENT:     Head: Normocephalic and atraumatic.  Eyes:     Conjunctiva/sclera: Conjunctivae normal.     Pupils: Pupils are equal, round, and reactive to light.  Cardiovascular:     Rate and Rhythm: Normal rate and regular rhythm.     Heart sounds: Normal heart sounds. No murmur heard.   Pulmonary:     Effort: Pulmonary effort is normal.     Breath sounds: Normal breath sounds.  Skin:    General: Skin is warm and dry.  Neurological:     General: No focal deficit present.     Mental Status: She is alert and oriented to person, place, and time. Mental status is at baseline.     Gait: Gait normal.  Psychiatric:        Attention and Perception: Attention and perception normal.        Mood and Affect: Mood and affect normal.  Speech: Speech normal.        Behavior: Behavior normal. Behavior is cooperative.        Thought Content: Thought content normal.        Cognition and Memory: Cognition and memory normal.        Judgment: Judgment normal.     Assessment  Plan  Abnormal thyroid function test improved 12/26/2019 09:20 TSH: 6.43 (H)  07/02/2020 09:16 TSH: 6.99  (H)  09/02/2020 15:12 TSH: 4.08  B12 deficiency - Plan: cyanocobalamin ((VITAMIN B-12)) injection 1,000 mcg every other month due 11/2020   Hyperlipidemia, unspecified hyperlipidemia type - Plan: rosuvastatin (CRESTOR) 5 MG tablet reduced 10 mg due to myalgia   Hypercalcemia ? Parathyroid issue (I.e hypo) rec D3 4000 IU qd low vitamin D Pending PTHrp Consider endocrine in the future   Overactive bladder  Consider GYN in the future  myrbetriq 50 mg qd did not help   HM Flu shotutd shingrix2/2 prevnar utd pna 23 due in future if not had  tdap utd 2021 covid vx 3/3 utd  Pap negative 05/10/16 neg HPVwill hold paps for nowout of age window  [facility] get copy release signedhad 09/29/18 negative [facility]  [facility] 10/06/17 osteopenia  -ordered mammo and dexa 10/14/20 [facility]  Colonoscopy 09/25/18 h/o polyps per pt get copy from Dr. Earlean Shawl in [place] polyps/cancer 08/28/15 [facility] medium colonoscopy repeat in 5 years Dr. Earlean Shawl no report from 09/25/18 -sent message 07/09/20 when due   DEXA 09/21/17 osteopenia rec vitamin D3 50K IU weekly then 4000 IU daily  Dermatology9/2020 Dr. Isenstein3/24/21 D3 4K Iu qd  former PCP Dr. Candiss Norse  Provider: Dr. Olivia Mackie McLean-Scocuzza-[facility]

## 2020-09-10 NOTE — Patient Instructions (Addendum)
Vitamin D3 4000 IU daily  Dr. Honor Junes [facility]     Hypercalcemia Hypercalcemia is when the level of calcium in a person's blood is above normal. The body needs calcium to make bones and keep them strong. Calcium also helps the muscles, nerves, brain, and heart work the way they should. Most of the calcium in the body is in the bones. There is also some calcium in the blood. Hypercalcemia can happen when calcium comes out of the bones, or when the kidneys are not able to remove calcium from the blood. Hypercalcemia can be mild or severe. What are the causes? There are many possible causes of hypercalcemia. Common causes of this condition include:  Hyperparathyroidism. This is a condition in which the body produces too much parathyroid hormone. There are four parathyroid glands in your neck. These glands produce a chemical messenger (hormone) that helps the body absorb calcium from foods and helps your bones release calcium.  Certain kinds of cancer. Less common causes of hypercalcemia include:  Getting too much calcium or vitamin D from your diet.  Kidney failure.  Hyperthyroidism.  Severe dehydration.  Being on bed rest or being inactive for a long time.  Certain medicines.  Infections. What increases the risk? You are more likely to develop this condition if you:  Are female.  Are 69 years of age or older.  Have a family history of hypercalcemia. What are the signs or symptoms? Mild hypercalcemia that starts slowly may not cause symptoms. Severe, sudden hypercalcemia is more likely to cause symptoms, such as:  Being more thirsty than usual.  Needing to urinate more often than usual.  Abdominal pain.  Nausea and vomiting.  Constipation.  Muscle pain, twitching, or weakness.  Feeling very tired. How is this diagnosed?  Hypercalcemia is usually diagnosed with a blood test. You may also have tests to help determine what is causing this condition,  such as imaging tests and more blood tests. How is this treated? Treatment for hypercalcemia depends on the cause. Treatment may include:  Receiving fluids through an IV.  Medicines that: ? Keep calcium levels steady after receiving fluids (loop diuretics). ? Keep calcium in your bones (bisphosphonates). ? Lower the calcium level in your blood.  Surgery to remove overactive parathyroid glands.  A procedure that filters your blood to correct calcium levels (hemodialysis). Follow these instructions at home:   Take over-the-counter and prescription medicines only as told by your health care provider.  Follow instructions from your health care provider about eating or drinking restrictions.  Drink enough fluid to keep your urine pale yellow.  Stay active. Weight-bearing exercise helps to keep calcium in your bones. Follow instructions from your health care provider about what type and level of exercise is safe for you.  Keep all follow-up visits as told by your health care provider. This is important. Contact a health care provider if you have:  A fever.  A heartbeat that is irregular or very fast.  Changes in mood, memory, or personality. Get help right away if you:  Have severe abdominal pain.  Have chest pain.  Have trouble breathing.  Become very confused and sleepy.  Lose consciousness. Summary  Hypercalcemia is when the level of calcium in a person's blood is above normal. The body needs calcium to make bones and keep them strong. Calcium also helps the muscles, nerves, brain, and heart work the way they should.  There are many possible causes of hypercalcemia, and treatment depends on  the cause.  Take over-the-counter and prescription medicines only as told by your health care provider.  Follow instructions from your health care provider about eating or drinking restrictions. This information is not intended to replace advice given to you by your health care  provider. Make sure you discuss any questions you have with your health care provider. Document Revised: 10/24/2018 Document Reviewed: 07/03/2018 Elsevier Patient Education  2020 [employer].

## 2020-09-13 LAB — PROTEIN ELECTROPHORESIS, SERUM
A/G Ratio: 1.2 (ref 0.7–1.7)
Albumin ELP: 4.1 g/dL (ref 2.9–4.4)
Alpha 1: 0.3 g/dL (ref 0.0–0.4)
Alpha 2: 0.9 g/dL (ref 0.4–1.0)
Beta: 1.2 g/dL (ref 0.7–1.3)
Gamma Globulin: 1 g/dL (ref 0.4–1.8)
Globulin, Total: 3.3 g/dL (ref 2.2–3.9)
Total Protein: 7.4 g/dL (ref 6.0–8.5)

## 2020-09-13 LAB — PTH, INTACT AND CALCIUM
Calcium: 10.7 mg/dL — ABNORMAL HIGH (ref 8.7–10.3)
PTH: 14 pg/mL — ABNORMAL LOW (ref 15–65)

## 2020-09-13 LAB — PTH-RELATED PEPTIDE: PTH-related peptide: <2 pmol/L

## 2020-09-13 LAB — CORTISOL-AM, BLOOD: Cortisol - AM: 8.6 ug/dL (ref 6.2–19.4)

## 2020-09-13 LAB — CALCIUM, IONIZED: Calcium, Ion: 5.6 mg/dL (ref 4.5–5.6)

## 2020-09-15 NOTE — Telephone Encounter (Signed)
Prior authorization has been approved.  Mychart message sent to inform the Patient

## 2020-09-22 NOTE — Addendum Note (Signed)
Addended by: Orland Mustard on: 09/22/2020 10:51 AM   Modules accepted: Orders

## 2020-11-11 ENCOUNTER — Other Ambulatory Visit: Payer: Self-pay

## 2020-11-11 ENCOUNTER — Ambulatory Visit (INDEPENDENT_AMBULATORY_CARE_PROVIDER_SITE_OTHER): Payer: 59

## 2020-11-11 DIAGNOSIS — E538 Deficiency of other specified B group vitamins: Secondary | ICD-10-CM | POA: Diagnosis not present

## 2020-11-11 MED ORDER — CYANOCOBALAMIN 1000 MCG/ML IJ SOLN
1000.0000 ug | Freq: Once | INTRAMUSCULAR | Status: AC
  Administered 2020-11-11: 1000 ug via INTRAMUSCULAR

## 2020-11-11 NOTE — Progress Notes (Signed)
Patient presented for B 12 injection to left deltoid, patient voiced no concerns nor showed any signs of distress during injection. 

## 2020-11-12 ENCOUNTER — Telehealth: Payer: Self-pay

## 2020-11-12 NOTE — Telephone Encounter (Signed)
Faxed exam request back with signature from Dr [facility] for [place] exam-estrogen def [phone] on 11/12/20

## 2020-11-14 ENCOUNTER — Telehealth: Payer: Self-pay | Admitting: Internal Medicine

## 2020-11-14 NOTE — Telephone Encounter (Signed)
Call and lm on vm to either upload front and back of new insurance card or call office. Office needs the Claims address and phone number.

## 2020-11-26 LAB — HM MAMMOGRAPHY

## 2020-11-26 LAB — HM DEXA SCAN

## 2020-11-27 ENCOUNTER — Encounter: Payer: Self-pay | Admitting: Internal Medicine

## 2020-11-27 ENCOUNTER — Telehealth: Payer: Self-pay | Admitting: Internal Medicine

## 2020-11-27 NOTE — Telephone Encounter (Signed)
Bone density with osteopenia/thin bones at [facility] 11/26/20 repeat in 2-5 years would rec 2-3 repeat  rec no calcium with h/o elevated calcium but rec vitamin D3 4000 IU daily otc   I do not see endocrine appt scheduled?  Thank you

## 2020-11-28 NOTE — Telephone Encounter (Signed)
Noted  

## 2020-11-28 NOTE — Telephone Encounter (Signed)
Pt is scheduled on 12/09/2020 at 1:15 pm.

## 2020-12-09 ENCOUNTER — Encounter: Payer: Self-pay | Admitting: Internal Medicine

## 2021-01-12 ENCOUNTER — Ambulatory Visit (INDEPENDENT_AMBULATORY_CARE_PROVIDER_SITE_OTHER): Payer: Medicare HMO

## 2021-01-12 ENCOUNTER — Other Ambulatory Visit: Payer: Self-pay | Admitting: Internal Medicine

## 2021-01-12 ENCOUNTER — Other Ambulatory Visit: Payer: Self-pay

## 2021-01-12 DIAGNOSIS — E538 Deficiency of other specified B group vitamins: Secondary | ICD-10-CM

## 2021-01-12 DIAGNOSIS — N3281 Overactive bladder: Secondary | ICD-10-CM

## 2021-01-12 DIAGNOSIS — M797 Fibromyalgia: Secondary | ICD-10-CM

## 2021-01-12 DIAGNOSIS — E785 Hyperlipidemia, unspecified: Secondary | ICD-10-CM

## 2021-01-12 MED ORDER — OLMESARTAN MEDOXOMIL 20 MG PO TABS: 20.0000 mg | ORAL_TABLET | Freq: Every day | ORAL | 3 refills | Status: DC

## 2021-01-12 MED ORDER — AMITRIPTYLINE HCL 25 MG PO TABS: ORAL_TABLET | ORAL | 3 refills | Status: DC

## 2021-01-12 MED ORDER — FENOFIBRATE MICRONIZED 130 MG PO CAPS: 130.0000 mg | ORAL_CAPSULE | Freq: Every day | ORAL | 3 refills | Status: DC

## 2021-01-12 MED ORDER — ROSUVASTATIN CALCIUM 5 MG PO TABS: 5.0000 mg | ORAL_TABLET | Freq: Every day | ORAL | 3 refills | Status: DC

## 2021-01-12 MED ORDER — CYANOCOBALAMIN 1000 MCG/ML IJ SOLN
1000.0000 ug | Freq: Once | INTRAMUSCULAR | Status: AC
  Administered 2021-01-12: 1000 ug via INTRAMUSCULAR

## 2021-01-12 MED ORDER — COLESTIPOL HCL 1 G PO TABS: 2.0000 g | ORAL_TABLET | Freq: Every day | ORAL | 3 refills | Status: DC

## 2021-01-12 MED ORDER — MIRABEGRON ER 50 MG PO TB24: 50.0000 mg | ORAL_TABLET | Freq: Every day | ORAL | 3 refills | Status: DC

## 2021-01-12 NOTE — Addendum Note (Signed)
Addended byElpidio Galea T on: 01/12/2021 02:04 PM   Modules accepted: Orders

## 2021-01-12 NOTE — Progress Notes (Signed)
Patient presented for B 12 injection to left deltoid, patient voiced no concerns nor showed any signs of distress during injection. 

## 2021-03-10 ENCOUNTER — Telehealth: Payer: Self-pay

## 2021-03-10 ENCOUNTER — Encounter: Payer: Self-pay | Admitting: Internal Medicine

## 2021-03-10 DIAGNOSIS — U071 COVID-19: Secondary | ICD-10-CM | POA: Diagnosis not present

## 2021-03-11 ENCOUNTER — Ambulatory Visit: Payer: 59 | Admitting: Internal Medicine

## 2021-03-11 NOTE — Telephone Encounter (Signed)
Patient was seen virtually somewhere else. Taking Advil, Mucinex, tylenol, and staying hydrated.   States she was told she does not qualify for COVID pill. Patient states her symptoms are better and does not need a virtual appointment. Aware that if her symptoms worsen or if her O2 stats drop she will go to ED.

## 2021-03-19 ENCOUNTER — Encounter: Payer: Self-pay | Admitting: Internal Medicine

## 2021-03-19 ENCOUNTER — Ambulatory Visit (INDEPENDENT_AMBULATORY_CARE_PROVIDER_SITE_OTHER): Payer: Medicare HMO | Admitting: Internal Medicine

## 2021-03-19 ENCOUNTER — Other Ambulatory Visit: Payer: Self-pay

## 2021-03-19 VITALS — BP 128/76 | HR 92 | Temp 97.9°F | Resp 16 | Ht 66.0 in | Wt 192.0 lb

## 2021-03-19 DIAGNOSIS — I159 Secondary hypertension, unspecified: Secondary | ICD-10-CM | POA: Diagnosis not present

## 2021-03-19 DIAGNOSIS — E785 Hyperlipidemia, unspecified: Secondary | ICD-10-CM | POA: Diagnosis not present

## 2021-03-19 DIAGNOSIS — U071 COVID-19: Secondary | ICD-10-CM | POA: Diagnosis not present

## 2021-03-19 DIAGNOSIS — Z1389 Encounter for screening for other disorder: Secondary | ICD-10-CM | POA: Diagnosis not present

## 2021-03-19 DIAGNOSIS — E559 Vitamin D deficiency, unspecified: Secondary | ICD-10-CM | POA: Diagnosis not present

## 2021-03-19 DIAGNOSIS — R42 Dizziness and giddiness: Secondary | ICD-10-CM | POA: Diagnosis not present

## 2021-03-19 DIAGNOSIS — Z0000 Encounter for general adult medical examination without abnormal findings: Secondary | ICD-10-CM

## 2021-03-19 DIAGNOSIS — Z1211 Encounter for screening for malignant neoplasm of colon: Secondary | ICD-10-CM

## 2021-03-19 DIAGNOSIS — I10 Essential (primary) hypertension: Secondary | ICD-10-CM | POA: Diagnosis not present

## 2021-03-19 DIAGNOSIS — R600 Localized edema: Secondary | ICD-10-CM

## 2021-03-19 DIAGNOSIS — E538 Deficiency of other specified B group vitamins: Secondary | ICD-10-CM | POA: Diagnosis not present

## 2021-03-19 MED ORDER — CYANOCOBALAMIN 1000 MCG/ML IJ SOLN
1000.0000 ug | Freq: Once | INTRAMUSCULAR | Status: AC
  Administered 2021-03-19: 1000 ug via INTRAMUSCULAR

## 2021-03-19 MED ORDER — FUROSEMIDE 20 MG PO TABS: 20.0000 mg | ORAL_TABLET | Freq: Every day | ORAL | 5 refills | Status: DC | PRN

## 2021-03-19 NOTE — Progress Notes (Signed)
Beth Fisher presents today for injection per MD orders. B12 injection  administered IM in right Upper Arm. Administration without incident. Patient tolerated well.  [name],cma

## 2021-03-19 NOTE — Progress Notes (Signed)
Chief Complaint  Patient presents with   Follow-up   annual Covid 19 03/09/2021 sore throat and h/a x 3 days sx's and fatigue feeling better and feeling dizziness  Abnormal tfts f/u 12/09/20 [facility] endocrine will monitor for now and return 06/2021 for labs and do thyroid US f/u in the future D.r O'connell thyroid normalized and hypercalcemia following  HLD repeat labs 06/2021 with endocrine  Review of Systems  Constitutional:  Positive for malaise/fatigue. Negative for weight loss.  HENT:  Negative for hearing loss.   Eyes:  Negative for blurred vision.  Respiratory:  Negative for shortness of breath.   Cardiovascular:  Negative for chest pain.  Musculoskeletal:  Negative for falls and joint pain.  Skin:  Negative for rash.  Neurological:  Negative for headaches.  Past Medical History:  Diagnosis Date   Arthritis    Carotid stenosis    b/l w/o sx's    COVID-19    03/10/21   [facility]   Fibromyalgia    Fibromyalgia    Gastric ulcer    1970s   GERD (gastroesophageal reflux disease)    History of colon polyps    History of UTI    Hyperlipidemia    Hypertension    Inflammatory polyps of colon ([place])    08/2008 OK 08/2010 Ok  byk Dr Earlean Shawl   Pneumonia 12/2011   Prediabetes    Ulcer    UTI (urinary tract infection)    Vitamin D deficiency    Past Surgical History:  Procedure Laterality Date   CHOLECYSTECTOMY N/A 01/07/2019   Procedure: LAPAROSCOPIC CHOLECYSTECTOMY;  Surgeon: Ralene Ok, MD;  Location: [facility];  Service: General;  Laterality: N/A;   HERNIA REPAIR  1978   Family History  Problem Relation Age of Onset   Hyperlipidemia Mother    Alzheimer's disease Mother    Cancer Mother        breast cancer dx'ed 18 y.o    Depression Mother    Miscarriages / [place] Mother    Colon polyps Mother    Diabetes Father    Heart disease Father        CABG x 7    Hyperlipidemia Father    Mental illness Father    Alzheimer's disease Father    Depression  Father    Early death Father    Colon polyps Father    Cancer Paternal Grandmother        colon   Diabetes Paternal Grandmother    Heart disease Cousin        sudden death less than 47 yrs old heart attack.   Deep vein thrombosis Maternal Grandmother    Cancer Maternal Grandfather        throat smoker   Ovarian cysts Granddaughter        ? cancer 71 y.o as of 09/10/20   Social History   Socioeconomic History   Marital status: Married    Spouse name: Not on file   Number of children: Not on file   Years of education: Not on file   Highest education level: Not on file  Occupational History   Not on file  Tobacco Use   Smoking status: Former    Pack years: 0.00    Types: Cigarettes    Quit date: 10/11/1980    Years since quitting: 40.4   Smokeless tobacco: Never  Substance and Sexual Activity   Alcohol use: No    Alcohol/week: 0.0 standard drinks  Drug use: No   Sexual activity: Yes  Other Topics Concern   Not on file  Social History Narrative   No guns, wears seat belt    Safe in relationship   Married    2 kids (boy and girl) and 3 grands   Retired Armed forces operational officer, some college    Social Determinants of Radio broadcast assistant Strain: Not on Art therapist Insecurity: Not on file  Transportation Needs: Not on file  Physical Activity: Not on file  Stress: Not on file  Social Connections: Not on file  Intimate Partner Violence: Not on file   Current Meds  Medication Sig   amitriptyline (ELAVIL) 25 MG tablet TAKE 1 TABLET(25 MG) BY MOUTH AT BEDTIME   colestipol (COLESTID) 1 g tablet Take 2 tablets (2 g total) by mouth daily.   fenofibrate micronized (ANTARA) 130 MG capsule Take 1 capsule (130 mg total) by mouth daily before breakfast.   olmesartan (BENICAR) 20 MG tablet Take 1 tablet (20 mg total) by mouth daily.   rosuvastatin (CRESTOR) 5 MG tablet Take 1 tablet (5 mg total) by mouth at bedtime.   Allergies  Allergen Reactions   Atorvastatin Other (See  Comments)   Norvasc [Amlodipine Besylate]     Leg swelling    Simvastatin Other (See Comments)   Zetia [Ezetimibe] Other (See Comments)    Muscle aches!!!   No results found for this or any previous visit (from the past 2160 hour(s)). Objective  Body mass index is 30.99 kg/m. Wt Readings from Last 3 Encounters:  03/19/21 192 lb (87.1 kg)  09/10/20 192 lb 12.8 oz (87.5 kg)  07/09/20 193 lb 9.6 oz (87.8 kg)   Temp Readings from Last 3 Encounters:  03/19/21 97.9 F (36.6 C)  09/10/20 98.2 F (36.8 C) (Oral)  07/09/20 97.8 F (36.6 C) (Oral)   BP Readings from Last 3 Encounters:  03/19/21 128/76  09/10/20 122/76  07/09/20 126/78   Pulse Readings from Last 3 Encounters:  03/19/21 92  09/10/20 91  07/09/20 91    Physical Exam Vitals and nursing note reviewed.  Constitutional:      Appearance: Normal appearance. She is well-developed and well-groomed. She is obese.  HENT:     Head: Normocephalic and atraumatic.  Eyes:     Conjunctiva/sclera: Conjunctivae normal.     Pupils: Pupils are equal, round, and reactive to light.  Cardiovascular:     Rate and Rhythm: Normal rate and regular rhythm.     Heart sounds: Normal heart sounds. No murmur heard. Pulmonary:     Effort: Pulmonary effort is normal.     Breath sounds: Normal breath sounds.  Skin:    General: Skin is warm and dry.  Neurological:     General: No focal deficit present.     Mental Status: She is alert and oriented to person, place, and time. Mental status is at baseline.     Gait: Gait normal.  Psychiatric:        Attention and Perception: Attention and perception normal.        Mood and Affect: Mood and affect normal.        Speech: Speech normal.        Behavior: Behavior normal. Behavior is cooperative.        Thought Content: Thought content normal.        Cognition and Memory: Cognition and memory normal.        Judgment: Judgment normal.  Assessment  Plan  Annual physical exam Flu shot  utd shingrix 2/2 prevnar utd pna 23 utd tdap utd 2021 covid vx 3/3 utd disc booster 4th dose covid 19 +03/09/21   Pap negative 05/10/16 neg HPV will hold paps for now out of age window   [facility] get copy release signed had 09/29/18 negative 10/15/19 [facility] negative  DEXA 10/06/17 osteopenia -ordered mammo and dexa 10/14/20 [facility] utd   Colonoscopy 09/25/18 h/o polyps per pt get copy from Dr. Earlean Shawl in [place] colon polyps/cancer  08/28/15 [facility] medium colonoscopy repeat in 5 years Dr. Earlean Shawl no report from 09/25/18  -sent message 07/09/20 when due    DEXA 09/21/17, 10/2020 osteopenia rec vitamin D3 50K IU weekly then 4000 IU daily Dermatology 06/2019 Dr. Kellie Moor 01/02/20  D3 4K Iu qd     former PCP Dr. Paticia Stack endocrine Dr. Ladell Pier 06/30/21 labs, will see if can do cmet cbc, lipid, ua, vitamin D thyroid US   Rec healthy diet and exercise   Hyperlipidemia, htn controlled - Plan: Lipid panel Check 06/2021 with endocrine  On benicar 20 mg qd and crestor 5 mg qhs   Vitamin D deficiency - Plan: Vitamin D (25 hydroxy)  COVID-19 with dizziness  Doing better lungs clear today  If dizziness continues rec Dr. Kathyrn Sheriff ENT hold for now  B12 def  Given shot today f/u Q30 days  Provider: Dr. Olivia Mackie McLean-Scocuzza-Internal Medicine

## 2021-03-19 NOTE — Patient Instructions (Addendum)
End of 05/2021 rec pfizer 4th shot or 2nd booster  Mucinex DM green label

## 2021-04-06 ENCOUNTER — Telehealth: Payer: Self-pay | Admitting: Internal Medicine

## 2021-04-06 NOTE — Addendum Note (Signed)
Addended by: Thressa Sheller on: 04/06/2021 02:41 PM   Modules accepted: Orders

## 2021-04-06 NOTE — Telephone Encounter (Signed)
[facility] called stating pt needs a referral so pt can get scheduled. thanks

## 2021-04-13 NOTE — Telephone Encounter (Signed)
Thank you referral needed Dr. Earlean Shawl [facility] in [facility]  Thanks [employer] [facility]

## 2021-05-12 ENCOUNTER — Ambulatory Visit: Payer: Medicare HMO

## 2021-05-18 ENCOUNTER — Other Ambulatory Visit: Payer: Self-pay

## 2021-05-18 ENCOUNTER — Ambulatory Visit (INDEPENDENT_AMBULATORY_CARE_PROVIDER_SITE_OTHER): Payer: 59

## 2021-05-18 DIAGNOSIS — E538 Deficiency of other specified B group vitamins: Secondary | ICD-10-CM | POA: Diagnosis not present

## 2021-05-18 MED ORDER — CYANOCOBALAMIN 1000 MCG/ML IJ SOLN
1000.0000 ug | Freq: Once | INTRAMUSCULAR | Status: AC
  Administered 2021-05-18: 1000 ug via INTRAMUSCULAR

## 2021-05-18 NOTE — Progress Notes (Signed)
Patient presented for B 12 injection to left deltoid, patient voiced no concerns nor showed any signs of distress during injection. 

## 2021-05-25 ENCOUNTER — Ambulatory Visit: Payer: Medicare HMO

## 2021-06-30 DIAGNOSIS — E039 Hypothyroidism, unspecified: Secondary | ICD-10-CM | POA: Diagnosis not present

## 2021-06-30 DIAGNOSIS — R300 Dysuria: Secondary | ICD-10-CM | POA: Diagnosis not present

## 2021-06-30 DIAGNOSIS — E782 Mixed hyperlipidemia: Secondary | ICD-10-CM | POA: Diagnosis not present

## 2021-07-20 ENCOUNTER — Ambulatory Visit (INDEPENDENT_AMBULATORY_CARE_PROVIDER_SITE_OTHER): Payer: Medicare HMO

## 2021-07-20 ENCOUNTER — Other Ambulatory Visit: Payer: Self-pay

## 2021-07-20 DIAGNOSIS — E538 Deficiency of other specified B group vitamins: Secondary | ICD-10-CM | POA: Diagnosis not present

## 2021-07-20 MED ORDER — CYANOCOBALAMIN 1000 MCG/ML IJ SOLN
1000.0000 ug | Freq: Once | INTRAMUSCULAR | Status: AC
  Administered 2021-07-20: 1000 ug via INTRAMUSCULAR

## 2021-07-20 NOTE — Progress Notes (Signed)
Patient presented for B 12 injection to right deltoid, patient voiced no concerns nor showed any signs of distress during injection. 

## 2021-08-25 DIAGNOSIS — K648 Other hemorrhoids: Secondary | ICD-10-CM | POA: Diagnosis not present

## 2021-08-25 DIAGNOSIS — Z800 Family history of malignant neoplasm of digestive organs: Secondary | ICD-10-CM | POA: Diagnosis not present

## 2021-08-25 DIAGNOSIS — Z86010 Personal history of colonic polyps: Secondary | ICD-10-CM | POA: Diagnosis not present

## 2021-08-25 DIAGNOSIS — K635 Polyp of colon: Secondary | ICD-10-CM | POA: Diagnosis not present

## 2021-08-25 DIAGNOSIS — Z1211 Encounter for screening for malignant neoplasm of colon: Secondary | ICD-10-CM | POA: Diagnosis not present

## 2021-08-25 DIAGNOSIS — D122 Benign neoplasm of ascending colon: Secondary | ICD-10-CM | POA: Diagnosis not present

## 2021-08-25 LAB — HM COLONOSCOPY

## 2021-08-26 ENCOUNTER — Encounter: Payer: Self-pay | Admitting: Internal Medicine

## 2021-09-02 DIAGNOSIS — T466X5A Adverse effect of antihyperlipidemic and antiarteriosclerotic drugs, initial encounter: Secondary | ICD-10-CM | POA: Diagnosis not present

## 2021-09-02 DIAGNOSIS — G720 Drug-induced myopathy: Secondary | ICD-10-CM | POA: Diagnosis not present

## 2021-09-02 DIAGNOSIS — E782 Mixed hyperlipidemia: Secondary | ICD-10-CM | POA: Diagnosis not present

## 2021-09-02 DIAGNOSIS — I10 Essential (primary) hypertension: Secondary | ICD-10-CM | POA: Diagnosis not present

## 2021-09-02 DIAGNOSIS — R42 Dizziness and giddiness: Secondary | ICD-10-CM | POA: Diagnosis not present

## 2021-09-02 DIAGNOSIS — E669 Obesity, unspecified: Secondary | ICD-10-CM | POA: Diagnosis not present

## 2021-09-02 DIAGNOSIS — I6523 Occlusion and stenosis of bilateral carotid arteries: Secondary | ICD-10-CM | POA: Diagnosis not present

## 2021-09-02 DIAGNOSIS — Z9049 Acquired absence of other specified parts of digestive tract: Secondary | ICD-10-CM | POA: Diagnosis not present

## 2021-09-02 DIAGNOSIS — K219 Gastro-esophageal reflux disease without esophagitis: Secondary | ICD-10-CM | POA: Diagnosis not present

## 2021-09-18 ENCOUNTER — Ambulatory Visit (INDEPENDENT_AMBULATORY_CARE_PROVIDER_SITE_OTHER): Payer: Medicare HMO | Admitting: Internal Medicine

## 2021-09-18 ENCOUNTER — Other Ambulatory Visit: Payer: Self-pay

## 2021-09-18 ENCOUNTER — Encounter: Payer: Self-pay | Admitting: Internal Medicine

## 2021-09-18 VITALS — BP 130/86 | HR 85 | Temp 97.8°F | Ht 66.0 in | Wt 194.4 lb

## 2021-09-18 DIAGNOSIS — I10 Essential (primary) hypertension: Secondary | ICD-10-CM | POA: Diagnosis not present

## 2021-09-18 DIAGNOSIS — M797 Fibromyalgia: Secondary | ICD-10-CM

## 2021-09-18 DIAGNOSIS — E785 Hyperlipidemia, unspecified: Secondary | ICD-10-CM

## 2021-09-18 DIAGNOSIS — R600 Localized edema: Secondary | ICD-10-CM | POA: Diagnosis not present

## 2021-09-18 DIAGNOSIS — Z1231 Encounter for screening mammogram for malignant neoplasm of breast: Secondary | ICD-10-CM | POA: Diagnosis not present

## 2021-09-18 DIAGNOSIS — E538 Deficiency of other specified B group vitamins: Secondary | ICD-10-CM

## 2021-09-18 MED ORDER — COLESTIPOL HCL 1 G PO TABS: 2.0000 g | ORAL_TABLET | Freq: Every day | ORAL | 3 refills | Status: DC

## 2021-09-18 MED ORDER — FUROSEMIDE 20 MG PO TABS: 20.0000 mg | ORAL_TABLET | Freq: Every day | ORAL | 5 refills | Status: DC | PRN

## 2021-09-18 MED ORDER — FENOFIBRATE MICRONIZED 130 MG PO CAPS: 130.0000 mg | ORAL_CAPSULE | Freq: Every day | ORAL | 3 refills | Status: DC

## 2021-09-18 MED ORDER — ROSUVASTATIN CALCIUM 5 MG PO TABS
5.0000 mg | ORAL_TABLET | Freq: Every day | ORAL | 3 refills | Status: DC
Start: 2021-09-18 — End: 2022-06-03

## 2021-09-18 MED ORDER — OLMESARTAN MEDOXOMIL 20 MG PO TABS: 20.0000 mg | ORAL_TABLET | Freq: Every day | ORAL | 3 refills | Status: DC

## 2021-09-18 MED ORDER — AMITRIPTYLINE HCL 25 MG PO TABS: ORAL_TABLET | ORAL | 3 refills | Status: DC

## 2021-09-18 MED ORDER — CYANOCOBALAMIN 1000 MCG/ML IJ SOLN
1000.0000 ug | INTRAMUSCULAR | Status: DC
  Administered 2021-09-18 – 2023-01-17 (×4): 1000 ug via INTRAMUSCULAR

## 2021-09-18 NOTE — Progress Notes (Signed)
Chief Complaint  Patient presents with   Follow-up   F/u  1. Htn controlled on benicar 20 mg lasix20 mg qd hld on crestr 5 mg qhs colestid 1 g  2. Hyperca 10.6 f/u Dr. Jeanette Fisher endocrine   Review of Systems  Constitutional:  Negative for weight loss.  HENT:  Negative for hearing loss.   Eyes:  Negative for blurred vision.  Respiratory:  Negative for shortness of breath.   Cardiovascular:  Negative for chest pain.  Gastrointestinal:  Negative for abdominal pain and blood in stool.  Genitourinary:  Negative for dysuria.  Musculoskeletal:  Negative for falls and joint pain.  Skin:  Negative for rash.  Neurological:  Negative for headaches.  Psychiatric/Behavioral:  Negative for depression.   Past Medical History:  Diagnosis Date   Arthritis    Carotid stenosis    b/l w/o sx's    COVID-19    03/10/21   [facility]   Fibromyalgia    Fibromyalgia    Gastric ulcer    1970s   GERD (gastroesophageal reflux disease)    History of colon polyps    History of UTI    Hyperlipidemia    Hypertension    Inflammatory polyps of colon ([place])    08/2008 Fisher 08/2010 Fisher  byk Dr Beth Fisher   Pneumonia 12/2011   Prediabetes    Ulcer    UTI (urinary tract infection)    Vitamin D deficiency    Past Surgical History:  Procedure Laterality Date   CHOLECYSTECTOMY N/A 01/07/2019   Procedure: LAPAROSCOPIC CHOLECYSTECTOMY;  Surgeon: Beth Ok, MD;  Location: [facility];  Service: General;  Laterality: N/A;   HERNIA REPAIR  1978   Family History  Problem Relation Age of Onset   Hyperlipidemia Mother    Alzheimer's disease Mother    Cancer Mother        breast cancer dx'ed 18 y.o    Depression Mother    Miscarriages / [place] Mother    Colon polyps Mother    Diabetes Father    Heart disease Father        CABG x 7    Hyperlipidemia Father    Mental illness Father    Alzheimer's disease Father    Depression Father    Early death Father    Colon polyps Father     Cancer Paternal Grandmother        colon   Diabetes Paternal Grandmother    Heart disease Cousin        sudden death less than 66 yrs old heart attack.   Deep vein thrombosis Maternal Grandmother    Cancer Maternal Grandfather        throat smoker   Ovarian cysts Granddaughter        ? cancer 76 y.o as of 09/10/20   Social History   Socioeconomic History   Marital status: Married    Spouse name: Not on file   Number of children: Not on file   Years of education: Not on file   Highest education level: Not on file  Occupational History   Not on file  Tobacco Use   Smoking status: Former    Types: Cigarettes    Quit date: 10/11/1980    Years since quitting: 40.9   Smokeless tobacco: Never  Substance and Sexual Activity   Alcohol use: No    Alcohol/week: 0.0 standard drinks   Drug use: No   Sexual activity: Yes  Other Topics  Concern   Not on file  Social History Narrative   No guns, wears seat belt    Safe in relationship   Married    2 kids (boy and girl) and 3 grands   2 miscarriages    Retired Armed forces operational officer, some college    Social Determinants of Radio broadcast assistant Strain: Not on file  Food Insecurity: Not on file  Transportation Needs: Not on file  Physical Activity: Not on file  Stress: Not on file  Social Connections: Not on file  Intimate Partner Violence: Not on file   Current Meds  Medication Sig   VITAMIN D PO Take by mouth.   [DISCONTINUED] amitriptyline (ELAVIL) 25 MG tablet TAKE 1 TABLET(25 MG) BY MOUTH AT BEDTIME   [DISCONTINUED] colestipol (COLESTID) 1 g tablet Take 2 tablets (2 g total) by mouth daily.   [DISCONTINUED] fenofibrate micronized (ANTARA) 130 MG capsule Take 1 capsule (130 mg total) by mouth daily before breakfast.   [DISCONTINUED] furosemide (LASIX) 20 MG tablet Take 1 tablet (20 mg total) by mouth daily as needed.   [DISCONTINUED] olmesartan (BENICAR) 20 MG tablet Take 1 tablet (20 mg total) by mouth daily.   [DISCONTINUED]  rosuvastatin (CRESTOR) 5 MG tablet Take 1 tablet (5 mg total) by mouth at bedtime.   Current Facility-Administered Medications for the 09/18/21 encounter (Office Visit) with Beth Fisher, Beth Glow, MD  Medication   cyanocobalamin ((VITAMIN B-12)) injection 1,000 mcg   Allergies  Allergen Reactions   Atorvastatin Other (See Comments)   Norvasc [Amlodipine Besylate]     Leg swelling    Simvastatin Other (See Comments)   Zetia [Ezetimibe] Other (See Comments)    Muscle aches!!!   Recent Results (from the past 2160 hour(s))  HM COLONOSCOPY     Status: None   Collection Time: 08/25/21 12:00 AM  Result Value Ref Range   HM Colonoscopy See Report (in chart) See Report (in chart), Patient Reported    Comment: polyps x 2 Dr. Earlean Fisher    Objective  Body mass index is 31.38 kg/m. Wt Readings from Last 3 Encounters:  09/18/21 194 lb 6.4 oz (88.2 kg)  03/19/21 192 lb (87.1 kg)  09/10/20 192 lb 12.8 oz (87.5 kg)   Temp Readings from Last 3 Encounters:  09/18/21 97.8 F (36.6 C) (Temporal)  03/19/21 97.9 F (36.6 C)  09/10/20 98.2 F (36.8 C) (Oral)   BP Readings from Last 3 Encounters:  09/18/21 130/86  03/19/21 128/76  09/10/20 122/76   Pulse Readings from Last 3 Encounters:  09/18/21 85  03/19/21 92  09/10/20 91    Physical Exam Vitals and nursing note reviewed.  Constitutional:      Appearance: Normal appearance. She is well-developed and well-groomed.  HENT:     Head: Normocephalic and atraumatic.  Eyes:     Conjunctiva/sclera: Conjunctivae normal.     Pupils: Pupils are equal, round, and reactive to light.  Cardiovascular:     Rate and Rhythm: Normal rate and regular rhythm.     Heart sounds: Normal heart sounds. No murmur heard. Pulmonary:     Effort: Pulmonary effort is normal.     Breath sounds: Normal breath sounds.  Abdominal:     General: Abdomen is flat. Bowel sounds are normal.     Tenderness: There is no abdominal tenderness.  Musculoskeletal:         General: No tenderness.  Skin:    General: Skin is warm and dry.  Neurological:  General: No focal deficit present.     Mental Status: She is alert and oriented to person, place, and time. Mental status is at baseline.     Cranial Nerves: Cranial nerves 2-12 are intact.     Gait: Gait is intact.  Psychiatric:        Attention and Perception: Attention and perception normal.        Mood and Affect: Mood and affect normal.        Speech: Speech normal.        Behavior: Behavior normal. Behavior is cooperative.        Thought Content: Thought content normal.        Cognition and Memory: Cognition and memory normal.        Judgment: Judgment normal.    Assessment  Plan  Hypertension, hld Benicar 20 mg qd lasix 20 mg qd  B12 deficiency - Plan: cyanocobalamin ((VITAMIN B-12)) injection 1,000 mcg Qomonth Rec B12 1000 mcg qd   Hyperlipidemia, unspecified hyperlipidemia type - Plan: fenofibrate micronized (ANTARA) 130 MG capsule, rosuvastatin (CRESTOR) 5 MG tablet  Fibromyalgia - Plan: amitriptyline (ELAVIL) 25 MG tablet  Leg edema - Plan: furosemide (LASIX) 20 MG tablet  Hypercalcemia 10.6 06/2021 f/u in 06/2022 Dr. Honor Junes   HM Flu shot utd shingrix 2/2 prevnar utd pna 23 utd tdap utd 2021 covid vx 4/4 utd covid 19 +03/09/21   Pap negative 05/10/16 neg HPV will hold paps for now out of age window   [facility] get copy release signed had 09/29/18 negative 10/15/19 [facility] negative  DEXA 10/06/17 osteopenia -ordered mammo and dexa osteopenia 11/26/20 [facility] utd ordered 2023   Colonoscopy 09/25/18 h/o polyps per pt get copy from Dr. Earlean Fisher in [place] colon polyps/cancer  08/28/15 [facility] medium colonoscopy repeat in 5 years Dr. Earlean Fisher no report from 09/25/18 08/25/21 polyps f/u 5 years    DEXA 09/21/17, 11/26/20 osteopenia rec vitamin D3 50K IU weekly then 4000 IU daily Dermatology Dr. Kellie Moor 3 or 01/2021  no ln2 or bx  D3 4K Iu qd     former PCP Dr.  Paticia Stack endocrine Dr. Ladell Pier 06/30/21 labs,   thyroid US   IMPRESSION: Moderately heterogeneous, normal sized thyroid gland without evidence for distinct thyroid nodule that meets criteria for FNA or follow-up ultrasound.   The above is in keeping with the ACR TI-RADS recommendations - J Am Coll Radiol 2017;14:587-595.     Electronically Signed   By: Constance Holster M.D.   On: 01/09/2020 16:03   Rec healthy diet and exercise     Provider: Dr. Olivia Mackie Beth Fisher-Internal Medicine

## 2021-09-18 NOTE — Patient Instructions (Addendum)
B12 1000 MCG daily nature made  Shots every other month

## 2021-09-19 IMAGING — US US THYROID
1 series · 14 of 25 positions shown · non-contrast
Comparison: None.

CLINICAL DATA: Elevated TSH

EXAM:
THYROID ULTRASOUND
TECHNIQUE: Ultrasound examination of the thyroid gland and adjacent soft
tissues was performed.

[Series 1: us thyroid · 0.07mm/px · 14 of 47 slices shown]
[im 1/47]
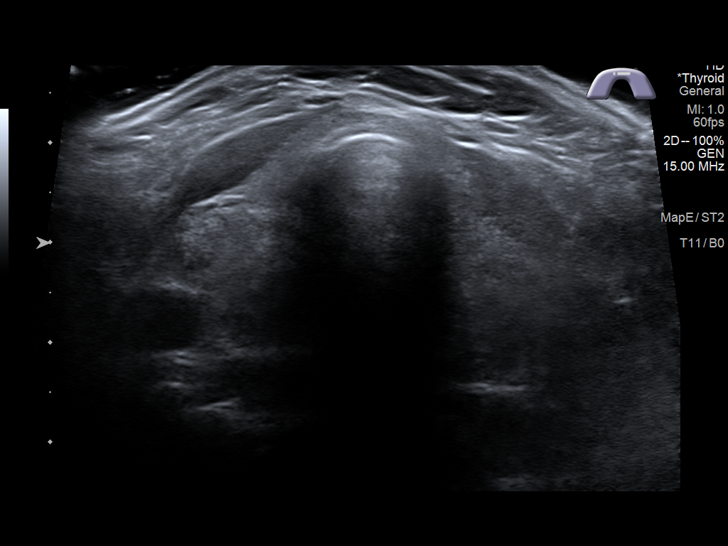
[im 4/47]
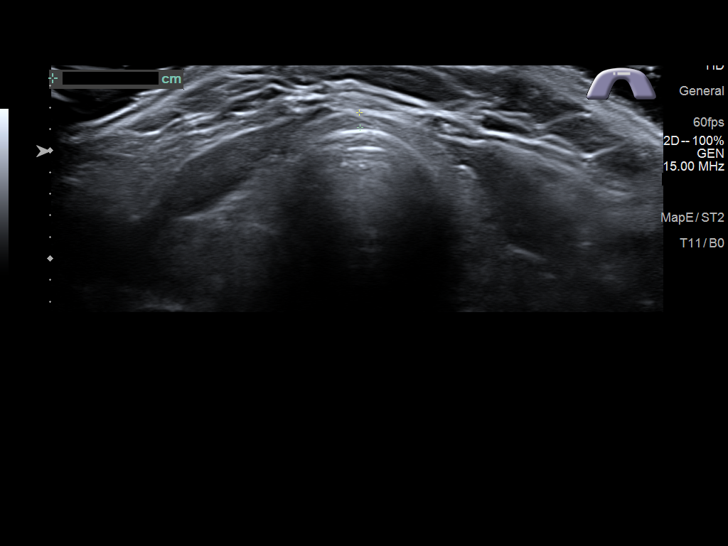
[im 8/47]
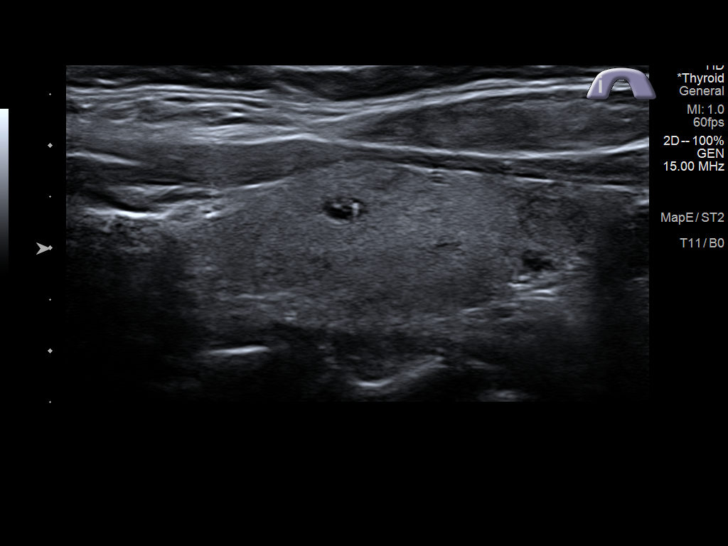
[im 12/47]
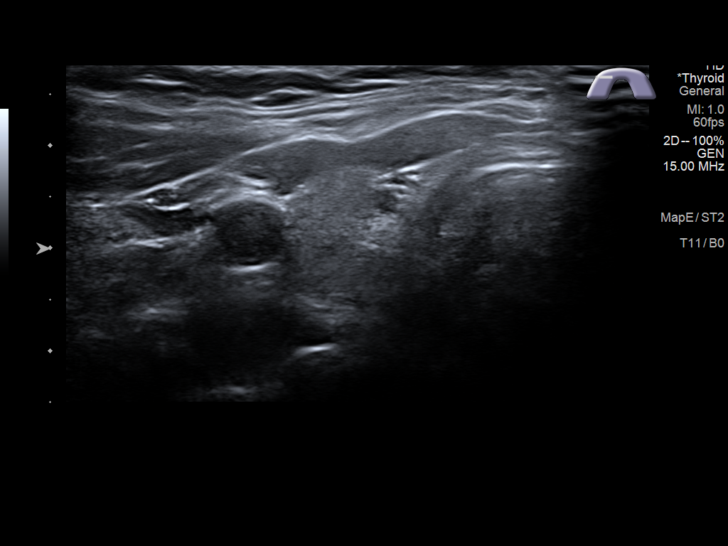
[im 16/47]
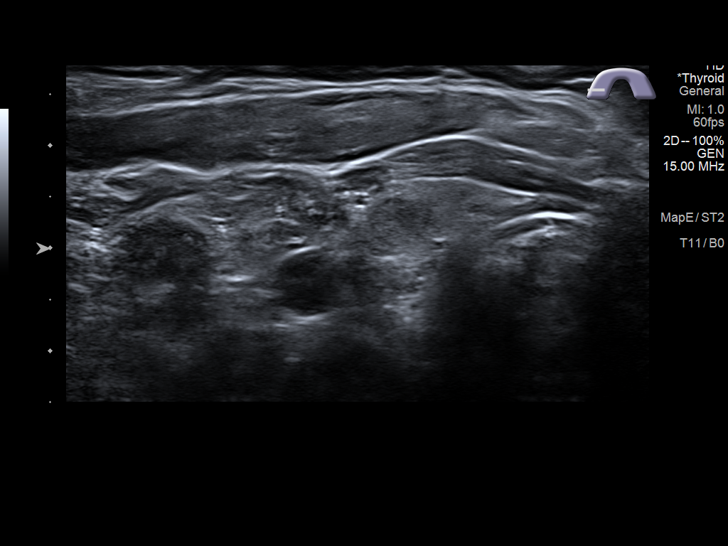
[im 18/47]
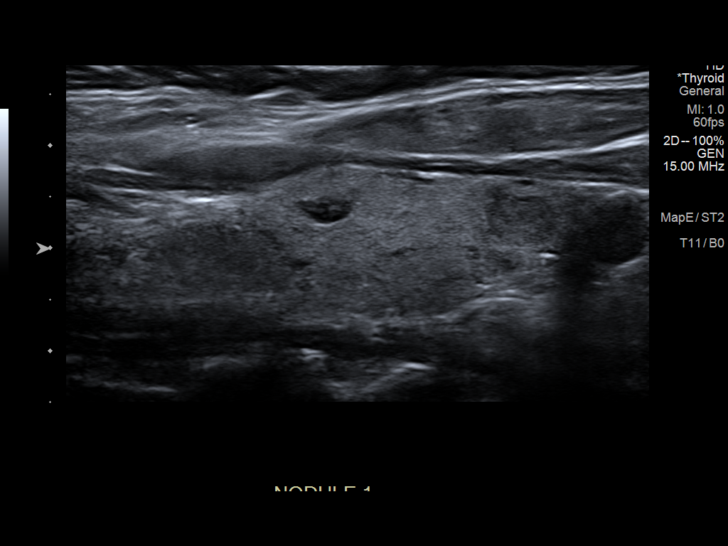
[im 22/47]
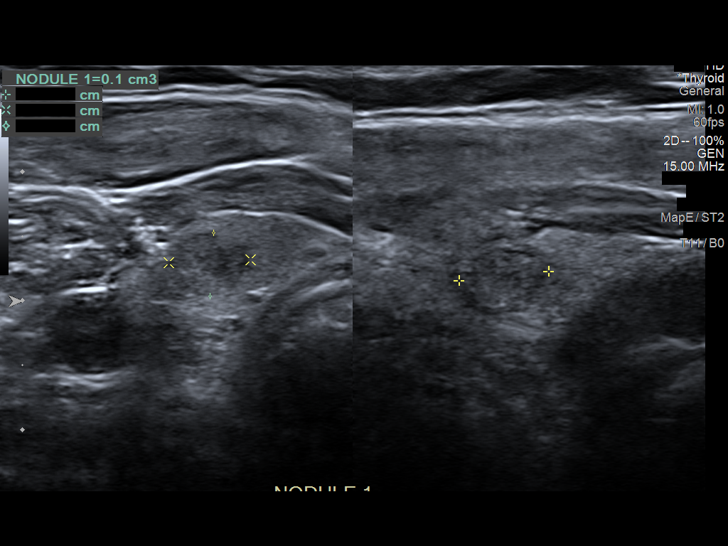
[im 25/47]
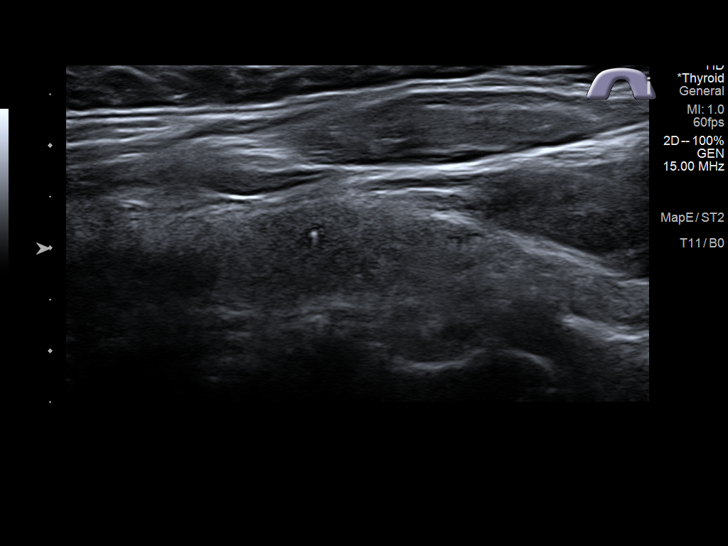
[im 29/47]
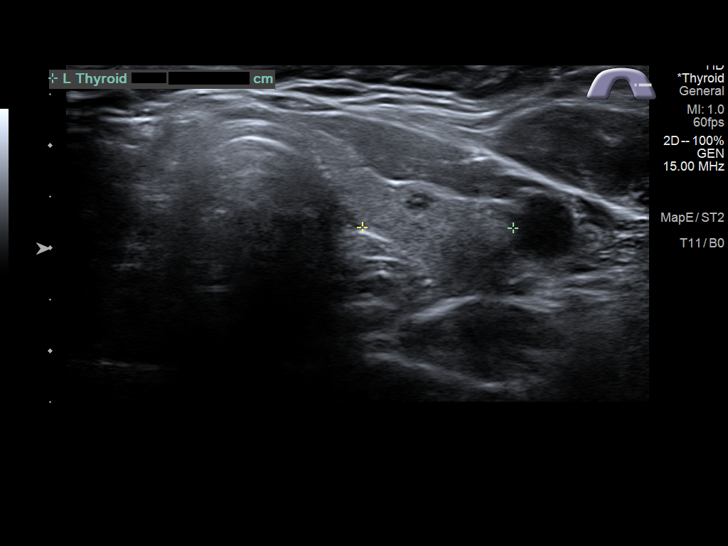
[im 31/47]
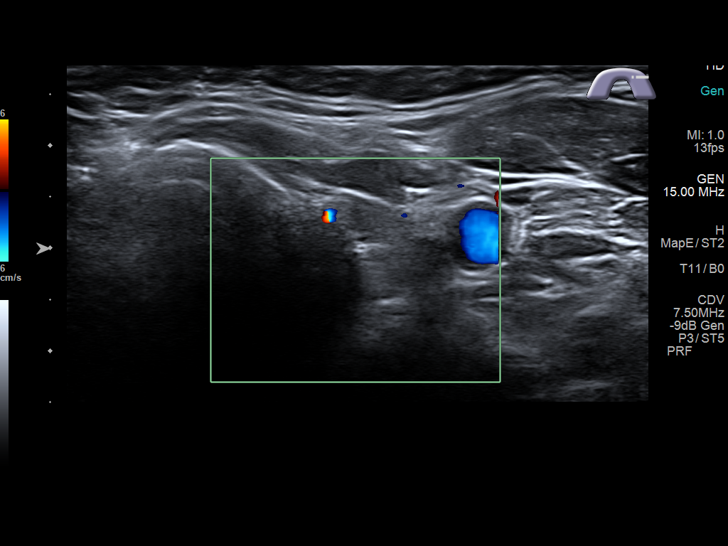
[im 35/47]
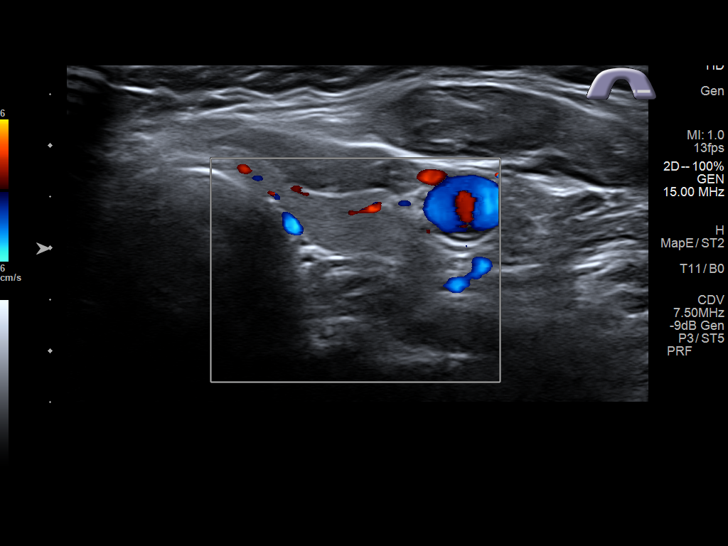
[im 39/47]
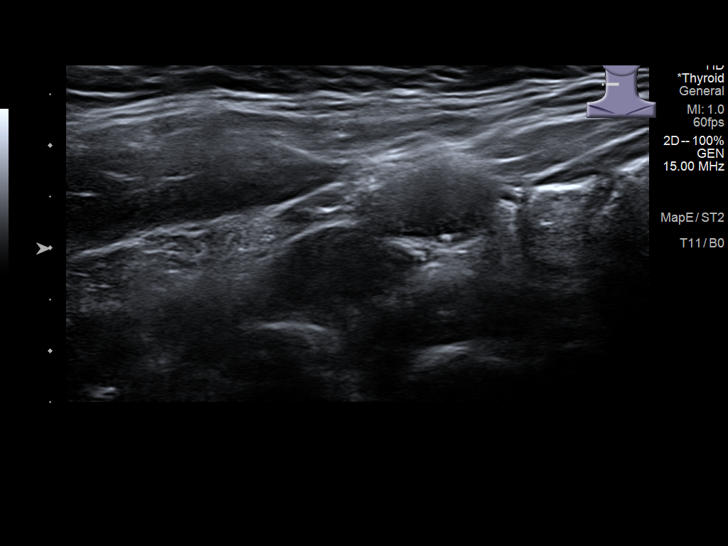
[im 43/47]
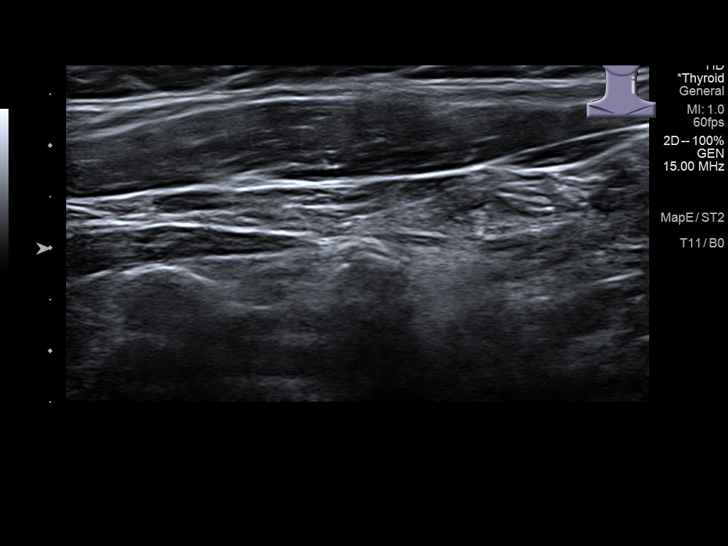
[im 47/47]
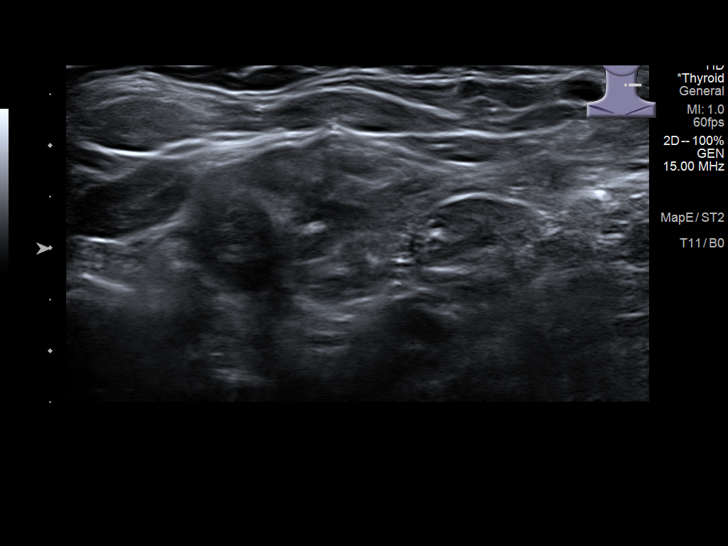

[14 of 25 positions shown; findings below may reference images not displayed]

FINDINGS: Parenchymal Echotexture: Moderately heterogenous

Isthmus: 0.1 cm

Right lobe: 4.4 x 1.4 x 1.3 cm

Left lobe: 2.9 x 0.7 x 1.5 cm

_________________________________________________________

Estimated total number of nodules >/= 1 cm: 0

Number of spongiform nodules >/=  2 cm not described below (TR1): 0

Number of mixed cystic and solid nodules >/= 1.5 cm not described
below (TR2): 0

_________________________________________________________

Multiple small subcentimeter thyroid nodules are noted, none of
which meet criteria for FNA or follow-up. For example there is a
cm thyroid nodule in the right inferior thyroid gland. No worrisome
cervical lymph nodes identified on this study.
IMPRESSION: Moderately heterogeneous, normal sized thyroid gland without
evidence for distinct thyroid nodule that meets criteria for FNA or
follow-up ultrasound.

The above is in keeping with the ACR TI-RADS recommendations - [HOSPITAL] 2588;[DATE].

## 2021-11-19 ENCOUNTER — Ambulatory Visit (INDEPENDENT_AMBULATORY_CARE_PROVIDER_SITE_OTHER): Payer: Medicare HMO

## 2021-11-19 ENCOUNTER — Other Ambulatory Visit: Payer: Self-pay

## 2021-11-19 DIAGNOSIS — E538 Deficiency of other specified B group vitamins: Secondary | ICD-10-CM | POA: Diagnosis not present

## 2021-11-19 MED ORDER — CYANOCOBALAMIN 1000 MCG/ML IJ SOLN
1000.0000 ug | Freq: Once | INTRAMUSCULAR | Status: AC
  Administered 2021-11-19: 1000 ug via INTRAMUSCULAR

## 2021-11-19 NOTE — Progress Notes (Signed)
Beth Fisher presents today for injection per MD orders. B12 injection administered IM in right Upper Arm. Administration without incident. Patient tolerated well.  Kamelia Lampkins,cma

## 2021-12-02 DIAGNOSIS — Z1231 Encounter for screening mammogram for malignant neoplasm of breast: Secondary | ICD-10-CM | POA: Diagnosis not present

## 2021-12-02 LAB — HM MAMMOGRAPHY

## 2021-12-03 ENCOUNTER — Encounter: Payer: Self-pay | Admitting: Internal Medicine

## 2021-12-24 DIAGNOSIS — L814 Other melanin hyperpigmentation: Secondary | ICD-10-CM | POA: Diagnosis not present

## 2021-12-24 DIAGNOSIS — D2261 Melanocytic nevi of right upper limb, including shoulder: Secondary | ICD-10-CM | POA: Diagnosis not present

## 2021-12-24 DIAGNOSIS — L578 Other skin changes due to chronic exposure to nonionizing radiation: Secondary | ICD-10-CM | POA: Diagnosis not present

## 2021-12-24 DIAGNOSIS — L821 Other seborrheic keratosis: Secondary | ICD-10-CM | POA: Diagnosis not present

## 2021-12-24 DIAGNOSIS — D225 Melanocytic nevi of trunk: Secondary | ICD-10-CM | POA: Diagnosis not present

## 2021-12-24 DIAGNOSIS — D2262 Melanocytic nevi of left upper limb, including shoulder: Secondary | ICD-10-CM | POA: Diagnosis not present

## 2022-01-14 ENCOUNTER — Ambulatory Visit (INDEPENDENT_AMBULATORY_CARE_PROVIDER_SITE_OTHER): Payer: Medicare HMO

## 2022-01-14 DIAGNOSIS — E538 Deficiency of other specified B group vitamins: Secondary | ICD-10-CM | POA: Diagnosis not present

## 2022-01-14 MED ORDER — CYANOCOBALAMIN 1000 MCG/ML IJ SOLN
1000.0000 ug | Freq: Once | INTRAMUSCULAR | Status: AC
  Administered 2022-01-14: 1000 ug via INTRAMUSCULAR

## 2022-01-14 NOTE — Progress Notes (Signed)
Pt arrived for B12 injection, given in R deltoid. Pt tolerated injection well, showed no signs of distress nor voiced any concerns.  

## 2022-03-15 ENCOUNTER — Ambulatory Visit (INDEPENDENT_AMBULATORY_CARE_PROVIDER_SITE_OTHER): Payer: Medicare HMO

## 2022-03-15 DIAGNOSIS — E538 Deficiency of other specified B group vitamins: Secondary | ICD-10-CM | POA: Diagnosis not present

## 2022-03-15 MED ORDER — CYANOCOBALAMIN 1000 MCG/ML IJ SOLN
1000.0000 ug | Freq: Once | INTRAMUSCULAR | Status: AC
  Administered 2022-03-15: 1000 ug via INTRAMUSCULAR

## 2022-03-15 NOTE — Progress Notes (Signed)
Patient presented for B 12 injection to left deltoid, patient voiced no concerns nor showed any signs of distress during injection. 

## 2022-03-18 ENCOUNTER — Telehealth: Payer: Self-pay | Admitting: Internal Medicine

## 2022-03-18 NOTE — Telephone Encounter (Signed)
Spoke with patient and she declined AWV do not call

## 2022-03-31 DIAGNOSIS — H40013 Open angle with borderline findings, low risk, bilateral: Secondary | ICD-10-CM | POA: Diagnosis not present

## 2022-03-31 DIAGNOSIS — Z0100 Encounter for examination of eyes and vision without abnormal findings: Secondary | ICD-10-CM | POA: Diagnosis not present

## 2022-03-31 DIAGNOSIS — H2513 Age-related nuclear cataract, bilateral: Secondary | ICD-10-CM | POA: Diagnosis not present

## 2022-05-17 ENCOUNTER — Ambulatory Visit (INDEPENDENT_AMBULATORY_CARE_PROVIDER_SITE_OTHER): Payer: Medicare HMO

## 2022-05-17 DIAGNOSIS — E538 Deficiency of other specified B group vitamins: Secondary | ICD-10-CM

## 2022-05-17 MED ORDER — CYANOCOBALAMIN 1000 MCG/ML IJ SOLN
1000.0000 ug | Freq: Once | INTRAMUSCULAR | Status: AC
  Administered 2022-05-17: 1000 ug via INTRAMUSCULAR

## 2022-05-17 NOTE — Progress Notes (Signed)
Pt arrived for B12 injection, given in L deltoid. Pt tolerated injection well, showed no signs of distress nor voiced any concerns.  ?

## 2022-06-03 ENCOUNTER — Ambulatory Visit (INDEPENDENT_AMBULATORY_CARE_PROVIDER_SITE_OTHER): Payer: Medicare HMO | Admitting: Internal Medicine

## 2022-06-03 ENCOUNTER — Encounter: Payer: Self-pay | Admitting: Internal Medicine

## 2022-06-03 VITALS — BP 140/78 | HR 86 | Temp 98.8°F | Ht 66.0 in | Wt 196.8 lb

## 2022-06-03 DIAGNOSIS — I10 Essential (primary) hypertension: Secondary | ICD-10-CM | POA: Diagnosis not present

## 2022-06-03 DIAGNOSIS — Z1389 Encounter for screening for other disorder: Secondary | ICD-10-CM | POA: Diagnosis not present

## 2022-06-03 DIAGNOSIS — R600 Localized edema: Secondary | ICD-10-CM

## 2022-06-03 DIAGNOSIS — Z1231 Encounter for screening mammogram for malignant neoplasm of breast: Secondary | ICD-10-CM | POA: Diagnosis not present

## 2022-06-03 DIAGNOSIS — E785 Hyperlipidemia, unspecified: Secondary | ICD-10-CM

## 2022-06-03 DIAGNOSIS — Z23 Encounter for immunization: Secondary | ICD-10-CM

## 2022-06-03 DIAGNOSIS — Z0000 Encounter for general adult medical examination without abnormal findings: Secondary | ICD-10-CM | POA: Diagnosis not present

## 2022-06-03 DIAGNOSIS — E559 Vitamin D deficiency, unspecified: Secondary | ICD-10-CM | POA: Diagnosis not present

## 2022-06-03 DIAGNOSIS — R946 Abnormal results of thyroid function studies: Secondary | ICD-10-CM

## 2022-06-03 DIAGNOSIS — M797 Fibromyalgia: Secondary | ICD-10-CM

## 2022-06-03 MED ORDER — OLMESARTAN MEDOXOMIL 20 MG PO TABS: 20.0000 mg | ORAL_TABLET | Freq: Every day | ORAL | 3 refills | Status: DC

## 2022-06-03 MED ORDER — FUROSEMIDE 20 MG PO TABS: 20.0000 mg | ORAL_TABLET | Freq: Every day | ORAL | 5 refills | Status: AC | PRN

## 2022-06-03 MED ORDER — AMITRIPTYLINE HCL 25 MG PO TABS: ORAL_TABLET | ORAL | 3 refills | Status: DC

## 2022-06-03 MED ORDER — PREVNAR 20 0.5 ML IM SUSY: 0.5000 mL | PREFILLED_SYRINGE | INTRAMUSCULAR | 0 refills | Status: DC

## 2022-06-03 MED ORDER — FENOFIBRATE MICRONIZED 130 MG PO CAPS: 130.0000 mg | ORAL_CAPSULE | Freq: Every day | ORAL | 3 refills | Status: DC

## 2022-06-03 MED ORDER — ROSUVASTATIN CALCIUM 5 MG PO TABS: 5.0000 mg | ORAL_TABLET | Freq: Every day | ORAL | 3 refills | Status: DC

## 2022-06-03 NOTE — Patient Instructions (Addendum)
Get flu shot high dose for >71 years old  Prevnar 20 now  Consider covid 19 shot in fall 2023   Goal blood pressure <130/<80  Consider echo with Dr. Clayborn Bigness  Check blood pressure daily  Exercise and eat right    For [place] conceptions IVF [facility] fertility clinic Dr. Diannia Ruder [facility]   NEW PCP Dr. Volanda Napoleon  Dr. Caro Hight clinic Dr. Alice Reichert  MD Physician   Primary Contact Information  Phone Fax E-mail Address  [phone] Not available [place] [address]     Specialties     Gastroenterology        Pneumococcal Conjugate Vaccine (Prevnar 20) Suspension for Injection What is this medication? PNEUMOCOCCAL VACCINE (NEU mo KOK al vak SEEN) is a vaccine. It prevents pneumococcus bacterial infections. These bacteria can cause serious infections like pneumonia, meningitis, and blood infections. This vaccine will not treat an infection and will not cause infection. This vaccine is recommended for adults 18 years and older. This medicine may be used for other purposes; ask your health care provider or pharmacist if you have questions. COMMON BRAND NAME(S): Prevnar 20 What should I tell my care team before I take this medication? They need to know if you have any of these conditions: bleeding disorder fever immune system problems an unusual or allergic reaction to pneumococcal vaccine, diphtheria toxoid, other vaccines, other medicines, foods, dyes, or preservatives pregnant or trying to get pregnant breast-feeding How should I use this medication? This vaccine is injected into a muscle. It is given by a health care provider. A copy of Vaccine Information Statements will be given before each vaccination. Be sure to read this information carefully each time. This sheet may change often. Talk to your health care provider about the use of this medicine in children. Special care may be needed. Overdosage: If you  think you have taken too much of this medicine contact a poison control center or emergency room at once. NOTE: This medicine is only for you. Do not share this medicine with others. What if I miss a dose? This does not apply. This medicine is not for regular use. What may interact with this medication? medicines for cancer chemotherapy medicines that suppress your immune function steroid medicines like prednisone or cortisone This list may not describe all possible interactions. Give your health care provider a list of all the medicines, herbs, non-prescription drugs, or dietary supplements you use. Also tell them if you smoke, drink alcohol, or use illegal drugs. Some items may interact with your medicine. What should I watch for while using this medication? Mild fever and pain should go away in 3 days or less. Report any unusual symptoms to your health care provider. What side effects may I notice from receiving this medication? Side effects that you should report to your doctor or health care professional as soon as possible: allergic reactions (skin rash, itching or hives; swelling of the face, lips, or tongue) confusion fast, irregular heartbeat fever over 102 degrees F muscle weakness seizures trouble breathing unusual bruising or bleeding Side effects that usually do not require medical attention (report to your doctor or health care professional if they continue or are bothersome): fever of 102 degrees F or less headache joint pain muscle cramps, pain pain, tender at site where injected This list may not describe all possible side effects. Call your doctor for medical advice about side effects. You may report side effects  to FDA at 1-800-FDA-1088. Where should I keep my medication? This vaccine is only given by a health care provider. It will not be stored at home. NOTE: This sheet is a summary. It may not cover all possible information. If you have questions about this  medicine, talk to your doctor, pharmacist, or health care provider.  2023 Elsevier/Gold Standard (2020-05-30 00:00:00)   Dr. Primitivo Gauze  Or   *[facility] Dr. Lanna Poche, Alphia Moh, Dr. Radene Knee, Dr. Gertie Fey [address], [address] [place] [address]  P: [phone] F: [phone]  [email or web address]

## 2022-06-03 NOTE — Progress Notes (Signed)
Chief Complaint  Patient presents with   Follow-up   Annual Exam   Annual  1. Bp elevated has been taking meds qhs benicar 20 and did not have today lasix 20 mg she takes prn chronic leg edema  2. Hld on colestid 2 g qd, fenofibrate 130 mg qdand crestor 5 mg qhs f/u cardiology Dr. Clayborn Bigness disc with pt based on last echo 2017 consider repeat echo with cards apt  INTERPRETATION  NORMAL LEFT VENTRICULAR SYSTOLIC FUNCTION   WITH MILD LVH  NORMAL RIGHT VENTRICULAR SYSTOLIC FUNCTION  MILD VALVULAR REGURGITATION (See above)  NO VALVULAR STENOSIS  IAS ANEURYSM  EF 50-55%   ___________________________________________________________________________________________   Electronically signed by: Lujean Amel, MD on 08/11/2016 11:23 AM              Performed By: Maurilio Lovely, RDCS        Ordering Physician: Lujean Amel     Review of Systems  Constitutional:  Negative for weight loss.  HENT:  Negative for hearing loss.   Eyes:  Negative for blurred vision.  Respiratory:  Negative for shortness of breath.   Cardiovascular:  Negative for chest pain.  Gastrointestinal:  Negative for abdominal pain and blood in stool.  Genitourinary:  Negative for dysuria.  Musculoskeletal:  Negative for falls and joint pain.  Skin:  Negative for rash.  Neurological:  Negative for headaches.  Psychiatric/Behavioral:  Negative for depression.    Past Medical History:  Diagnosis Date   Arthritis    Carotid stenosis    b/l w/o sx's    COVID-19    03/10/21   [facility]   Fibromyalgia    Fibromyalgia    Gastric ulcer    1970s   GERD (gastroesophageal reflux disease)    History of colon polyps    History of UTI    Hyperlipidemia    Hypertension    Inflammatory polyps of colon ([place])    08/2008 OK 08/2010 Ok  byk Dr Earlean Shawl   Pneumonia 12/2011   Prediabetes    Ulcer    UTI (urinary tract infection)    Vitamin D deficiency    Past Surgical History:  Procedure Laterality  Date   CHOLECYSTECTOMY N/A 01/07/2019   Procedure: LAPAROSCOPIC CHOLECYSTECTOMY;  Surgeon: Ralene Ok, MD;  Location: [facility];  Service: General;  Laterality: N/A;   HERNIA REPAIR  1978   Family History  Problem Relation Age of Onset   Hyperlipidemia Mother    Alzheimer's disease Mother    Cancer Mother        breast cancer dx'ed 45 y.o    Depression Mother    Miscarriages / [place] Mother    Colon polyps Mother    Diabetes Father    Heart disease Father        CABG x 7    Hyperlipidemia Father    Mental illness Father    Alzheimer's disease Father    Depression Father    Early death Father    Colon polyps Father    Cancer Paternal Grandmother        colon   Diabetes Paternal Grandmother    Heart disease Cousin        sudden death less than 19 yrs old heart attack.   Deep vein thrombosis Maternal Grandmother    Cancer Maternal Grandfather        throat smoker   Ovarian cysts Granddaughter        ? cancer 35 y.o  as of 09/10/20   Social History   Socioeconomic History   Marital status: Married    Spouse name: Not on file   Number of children: Not on file   Years of education: Not on file   Highest education level: Not on file  Occupational History   Not on file  Tobacco Use   Smoking status: Former    Types: Cigarettes    Quit date: 10/11/1980    Years since quitting: 41.6   Smokeless tobacco: Never  Substance and Sexual Activity   Alcohol use: No    Alcohol/week: 0.0 standard drinks of alcohol   Drug use: No   Sexual activity: Yes  Other Topics Concern   Not on file  Social History Narrative   No guns, wears seat belt    Safe in relationship   Married    2 kids (boy and girl) and 3 grands   2 miscarriages    Retired Armed forces operational officer, some college    Social Determinants of Radio broadcast assistant Strain: Not on Art therapist Insecurity: Not on file  Transportation Needs: Not on file  Physical Activity: Not on file  Stress: Not on file  Social  Connections: Not on file  Intimate Partner Violence: Not on file   Current Meds  Medication Sig   [DISCONTINUED] pneumococcal 20-valent conjugate vaccine (PREVNAR 20) 0.5 ML injection Inject 0.5 mLs into the muscle tomorrow at 10 am for 1 dose.   Current Facility-Administered Medications for the 06/03/22 encounter (Office Visit) with McLean-Scocuzza, Nino Glow, MD  Medication   cyanocobalamin ((VITAMIN B-12)) injection 1,000 mcg   Allergies  Allergen Reactions   Atorvastatin Other (See Comments)   Norvasc [Amlodipine Besylate]     Leg swelling    Simvastatin Other (See Comments)   Zetia [Ezetimibe] Other (See Comments)    Muscle aches!!!   No results found for this or any previous visit (from the past 2160 hour(s)). Objective  Body mass index is 31.76 kg/m. Wt Readings from Last 3 Encounters:  06/03/22 196 lb 12.8 oz (89.3 kg)  09/18/21 194 lb 6.4 oz (88.2 kg)  03/19/21 192 lb (87.1 kg)   Temp Readings from Last 3 Encounters:  06/03/22 98.8 F (37.1 C) (Oral)  09/18/21 97.8 F (36.6 C) (Temporal)  03/19/21 97.9 F (36.6 C)   BP Readings from Last 3 Encounters:  06/03/22 (!) 140/78  09/18/21 130/86  03/19/21 128/76   Pulse Readings from Last 3 Encounters:  06/03/22 86  09/18/21 85  03/19/21 92    Physical Exam Vitals and nursing note reviewed.  Constitutional:      Appearance: Normal appearance. She is well-developed and well-groomed.  HENT:     Head: Normocephalic and atraumatic.  Eyes:     Conjunctiva/sclera: Conjunctivae normal.     Pupils: Pupils are equal, round, and reactive to light.  Cardiovascular:     Rate and Rhythm: Normal rate and regular rhythm.     Heart sounds: Normal heart sounds. No murmur heard. Pulmonary:     Effort: Pulmonary effort is normal.     Breath sounds: Normal breath sounds.  Abdominal:     General: Abdomen is flat. Bowel sounds are normal.     Tenderness: There is no abdominal tenderness.  Musculoskeletal:        General:  No tenderness.  Skin:    General: Skin is warm and dry.  Neurological:     General: No focal deficit present.  Mental Status: She is alert and oriented to person, place, and time. Mental status is at baseline.     Cranial Nerves: Cranial nerves 2-12 are intact.     Motor: Motor function is intact.     Coordination: Coordination is intact.     Gait: Gait is intact.  Psychiatric:        Attention and Perception: Attention and perception normal.        Mood and Affect: Mood and affect normal.        Speech: Speech normal.        Behavior: Behavior normal. Behavior is cooperative.        Thought Content: Thought content normal.        Cognition and Memory: Cognition and memory normal.        Judgment: Judgment normal.     Assessment  Plan  Annual physical exam See below   Hyperlipidemia, unspecified hyperlipidemia type - Plan: rosuvastatin (CRESTOR) 5 MG tablet, fenofibrate micronized (ANTARA) 130 MG capsule, colestid 2 g qd  F/u cardiology as well   Leg edema - Plan: furosemide (LASIX) 20 MG tablet qd prn  Fibromyalgia - Plan: amitriptyline (ELAVIL) 25 MG tablet  Primary hypertension BP elevated today she did not take medication qam advised to change from pm to qam- Plan: olmesartan (BENICAR) 20 MG tablet, Comprehensive metabolic panel, Lipid panel, CBC with Differential/Platelet, Urinalysis, Routine w reflex microscopic If BP elevated at f/u increase benicar to 40 mg qd and check bmet in 2 weeks  Abnormal thyroid function test - Plan: TSH Established Dr. Jeanette Caprice endocrine f/u sch 07/01/22     HM Flu shot had 06/19/21 rec for this year shingrix 2/2 prevnar utd pna 23 utd Consider prevnar 20 sent Rx to pharmacy tdap utd 01/19/2020 covid vx 4/4 utd covid 19 +03/09/21   Pap negative 05/10/16 neg HPV will hold paps for now out of age window   Mammogram [facility] get copy release signed had 09/29/18 negative FH breast cancer mom 12/02/21 negative [facility] ordered 11/2022     DEXA 11/26/20 osteopenia [facility] rec wt bearing exercise and vitamin D3 4000 IU only h/o hyperca f/u [facility]  B12 def gets B12 shots Qomonth and B12 orally    Colonoscopy 09/25/18 h/o polyps per pt get copy from Dr. Earlean Shawl in [place] [facility] colon polyps/cancer  08/28/15 I[facility] medium colonoscopy repeat in 5 years Dr. Earlean Shawl no report from 09/25/18 08/25/21 polyps f/u 5 years disc transition to [facility] in the future Dr. Earlean Shawl will retire soon    Dermatology Dr. Kellie Moor 3 or 01/2021  no ln2 or bx      former PCP Dr. Candiss Norse  Other specialists  [facility] endocrine Dr. Ladell Pier 06/30/21 labs f/u 06/2022 also hyperca, osteopenia, abnormal tfts   thyroid US   IMPRESSION: Moderately heterogeneous, normal sized thyroid gland without evidence for distinct thyroid nodule that meets criteria for FNA or follow-up ultrasound.   The above is in keeping with the ACR TI-RADS recommendations - J Am Coll Radiol 2017;14:587-595.     Electronically Signed   By: Constance Holster M.D.   On: 01/09/2020 16:03    Rec healthy diet and exercise   Eye MD 12/2021 change in Rx Derm 12/2021 Dr. Kellie Moor Dr. Honor Junes 07/01/22 [facility] endocrine  Dr. Clayborn Bigness cards    Provider: Dr. Olivia Mackie McLean-Scocuzza-Internal Medicine

## 2022-06-07 MED ORDER — COLESTIPOL HCL 1 G PO TABS: 2.0000 g | ORAL_TABLET | Freq: Every day | ORAL | 3 refills | Status: DC

## 2022-06-07 MED ORDER — PREVNAR 20 0.5 ML IM SUSY: 0.5000 mL | PREFILLED_SYRINGE | INTRAMUSCULAR | 0 refills | Status: AC

## 2022-06-07 MED ORDER — AMITRIPTYLINE HCL 25 MG PO TABS: ORAL_TABLET | ORAL | 3 refills | Status: DC

## 2022-06-08 ENCOUNTER — Other Ambulatory Visit (INDEPENDENT_AMBULATORY_CARE_PROVIDER_SITE_OTHER): Payer: Medicare HMO

## 2022-06-08 DIAGNOSIS — I10 Essential (primary) hypertension: Secondary | ICD-10-CM | POA: Diagnosis not present

## 2022-06-08 DIAGNOSIS — E559 Vitamin D deficiency, unspecified: Secondary | ICD-10-CM

## 2022-06-08 DIAGNOSIS — R946 Abnormal results of thyroid function studies: Secondary | ICD-10-CM

## 2022-06-08 DIAGNOSIS — Z1389 Encounter for screening for other disorder: Secondary | ICD-10-CM | POA: Diagnosis not present

## 2022-06-08 LAB — CBC WITH DIFFERENTIAL/PLATELET
Basophils Absolute: 0 10*3/uL (ref 0.0–0.1)
Basophils Relative: 0.3 % (ref 0.0–3.0)
Eosinophils Absolute: 0.2 10*3/uL (ref 0.0–0.7)
Eosinophils Relative: 3.6 % (ref 0.0–5.0)
HCT: 39.9 % (ref 36.0–46.0)
Hemoglobin: 13.6 g/dL (ref 12.0–15.0)
Lymphocytes Relative: 43.9 % (ref 12.0–46.0)
Lymphs Abs: 2.2 10*3/uL (ref 0.7–4.0)
MCHC: 34.1 g/dL (ref 30.0–36.0)
MCV: 88.6 fl (ref 78.0–100.0)
Monocytes Absolute: 0.5 10*3/uL (ref 0.1–1.0)
Monocytes Relative: 10.1 % (ref 3.0–12.0)
Neutro Abs: 2.1 10*3/uL (ref 1.4–7.7)
Neutrophils Relative %: 42.1 % — ABNORMAL LOW (ref 43.0–77.0)
Platelets: 312 10*3/uL (ref 150.0–400.0)
RBC: 4.51 Mil/uL (ref 3.87–5.11)
RDW: 13 % (ref 11.5–15.5)
WBC: 5 10*3/uL (ref 4.0–10.5)

## 2022-06-08 LAB — LIPID PANEL
Cholesterol: 231 mg/dL — ABNORMAL HIGH (ref 0–200)
HDL: 48.4 mg/dL (ref >39.00)
NonHDL: 182.76
Total CHOL/HDL Ratio: 5
Triglycerides: 210 mg/dL — ABNORMAL HIGH (ref 0.0–149.0)
VLDL: 42 mg/dL — ABNORMAL HIGH (ref 0.0–40.0)

## 2022-06-08 LAB — COMPREHENSIVE METABOLIC PANEL
ALT: 21 U/L (ref 0–35)
AST: 20 U/L (ref 0–37)
Albumin: 4.6 g/dL (ref 3.5–5.2)
Alkaline Phosphatase: 35 U/L — ABNORMAL LOW (ref 39–117)
BUN: 20 mg/dL (ref 6–23)
CO2: 23 mEq/L (ref 19–32)
Calcium: 10.5 mg/dL (ref 8.4–10.5)
Chloride: 104 mEq/L (ref 96–112)
Creatinine, Ser: 0.96 mg/dL (ref 0.40–1.20)
GFR: 59.49 mL/min — ABNORMAL LOW (ref >60.00)
Glucose, Bld: 102 mg/dL — ABNORMAL HIGH (ref 70–99)
Potassium: 4 mEq/L (ref 3.5–5.1)
Sodium: 138 mEq/L (ref 135–145)
Total Bilirubin: 0.7 mg/dL (ref 0.2–1.2)
Total Protein: 7.4 g/dL (ref 6.0–8.3)

## 2022-06-08 LAB — LDL CHOLESTEROL, DIRECT: Direct LDL: 150 mg/dL

## 2022-06-08 LAB — VITAMIN D 25 HYDROXY (VIT D DEFICIENCY, FRACTURES): VITD: 28.24 ng/mL — ABNORMAL LOW (ref 30.00–100.00)

## 2022-06-08 LAB — TSH: TSH: 5.81 u[IU]/mL — ABNORMAL HIGH (ref 0.35–5.50)

## 2022-06-09 LAB — URINALYSIS, ROUTINE W REFLEX MICROSCOPIC
Bilirubin Urine: NEGATIVE
Glucose, UA: NEGATIVE
Hgb urine dipstick: NEGATIVE
Ketones, ur: NEGATIVE
Nitrite: NEGATIVE
Specific Gravity, Urine: 1.022 (ref 1.001–1.035)
pH: 5.5 (ref 5.0–8.0)

## 2022-06-09 LAB — MICROSCOPIC MESSAGE

## 2022-06-10 ENCOUNTER — Encounter: Payer: Self-pay | Admitting: Internal Medicine

## 2022-06-10 DIAGNOSIS — E039 Hypothyroidism, unspecified: Secondary | ICD-10-CM | POA: Insufficient documentation

## 2022-06-16 ENCOUNTER — Encounter: Payer: Self-pay | Admitting: Internal Medicine

## 2022-06-16 ENCOUNTER — Other Ambulatory Visit: Payer: Self-pay | Admitting: Internal Medicine

## 2022-06-16 DIAGNOSIS — N3000 Acute cystitis without hematuria: Secondary | ICD-10-CM

## 2022-07-01 DIAGNOSIS — E039 Hypothyroidism, unspecified: Secondary | ICD-10-CM | POA: Diagnosis not present

## 2022-07-01 DIAGNOSIS — E782 Mixed hyperlipidemia: Secondary | ICD-10-CM | POA: Diagnosis not present

## 2022-07-19 ENCOUNTER — Ambulatory Visit (INDEPENDENT_AMBULATORY_CARE_PROVIDER_SITE_OTHER): Payer: Medicare HMO

## 2022-07-19 DIAGNOSIS — E538 Deficiency of other specified B group vitamins: Secondary | ICD-10-CM

## 2022-07-19 NOTE — Progress Notes (Signed)
Pt presented for her vitamin B12 shot. Pt was identified through two identifiers. Pt tolerated injection well in the right deltoid.  [place], CMA

## 2022-08-23 DIAGNOSIS — T466X5A Adverse effect of antihyperlipidemic and antiarteriosclerotic drugs, initial encounter: Secondary | ICD-10-CM | POA: Diagnosis not present

## 2022-08-23 DIAGNOSIS — I6523 Occlusion and stenosis of bilateral carotid arteries: Secondary | ICD-10-CM | POA: Diagnosis not present

## 2022-08-23 DIAGNOSIS — E782 Mixed hyperlipidemia: Secondary | ICD-10-CM | POA: Diagnosis not present

## 2022-08-23 DIAGNOSIS — E669 Obesity, unspecified: Secondary | ICD-10-CM | POA: Diagnosis not present

## 2022-08-23 DIAGNOSIS — G720 Drug-induced myopathy: Secondary | ICD-10-CM | POA: Diagnosis not present

## 2022-08-23 DIAGNOSIS — K219 Gastro-esophageal reflux disease without esophagitis: Secondary | ICD-10-CM | POA: Diagnosis not present

## 2022-08-23 DIAGNOSIS — R42 Dizziness and giddiness: Secondary | ICD-10-CM | POA: Diagnosis not present

## 2022-08-23 DIAGNOSIS — I10 Essential (primary) hypertension: Secondary | ICD-10-CM | POA: Diagnosis not present

## 2022-09-08 ENCOUNTER — Encounter: Payer: Self-pay | Admitting: Internal Medicine

## 2022-09-20 ENCOUNTER — Ambulatory Visit (INDEPENDENT_AMBULATORY_CARE_PROVIDER_SITE_OTHER): Payer: Medicare HMO

## 2022-09-20 DIAGNOSIS — E538 Deficiency of other specified B group vitamins: Secondary | ICD-10-CM

## 2022-09-20 NOTE — Progress Notes (Signed)
Pt presented for their vitamin B12 injection. Pt was identified through two identifiers. Pt tolerated shot well in their left  deltoid.  

## 2022-09-29 ENCOUNTER — Ambulatory Visit
Admission: EM | Admit: 2022-09-29 | Discharge: 2022-09-29 | Disposition: A | Discharge status: Home / Self Care | Payer: Medicare HMO | Attending: Emergency Medicine | Admitting: Emergency Medicine

## 2022-09-29 ENCOUNTER — Telehealth: Payer: Self-pay | Admitting: Family Medicine

## 2022-09-29 DIAGNOSIS — R300 Dysuria: Secondary | ICD-10-CM | POA: Diagnosis not present

## 2022-09-29 LAB — POCT URINALYSIS DIP (MANUAL ENTRY)
Bilirubin, UA: NEGATIVE
Blood, UA: NEGATIVE
Glucose, UA: NEGATIVE mg/dL
Ketones, POC UA: NEGATIVE mg/dL
Nitrite, UA: NEGATIVE
Protein Ur, POC: NEGATIVE mg/dL
Spec Grav, UA: 1.01 (ref 1.010–1.025)
Urobilinogen, UA: 0.2 E.U./dL
pH, UA: 6.5 (ref 5.0–8.0)

## 2022-09-29 MED ORDER — CEPHALEXIN 500 MG PO CAPS: 500.0000 mg | ORAL_CAPSULE | Freq: Two times a day (BID) | ORAL | 0 refills | Status: AC

## 2022-09-29 NOTE — ED Provider Notes (Signed)
Roderic Palau    CSN: [id number] Arrival date & time: 09/29/22  1155      History   Chief Complaint Chief Complaint  Patient presents with   Urinary Frequency    HPI Beth Fisher is a 71 y.o. female.  Patient presents with dysuria, urinary frequency, malodorous urine, and lower abdominal pressure x 4 days.  No fever, chills, flank pain, vaginal discharge, pelvic pain, or other symptoms.  Treatment at home with Advil.  Her medical history includes hypertension, fibromyalgia, reactive airway disease, peptic ulcer disease, prediabetes.    The history is provided by the patient and medical records.    Past Medical History:  Diagnosis Date   Arthritis    Carotid stenosis    b/l w/o sx's    COVID-19    03/10/21   [facility]   Fibromyalgia    Fibromyalgia    Gastric ulcer    1970s   GERD (gastroesophageal reflux disease)    History of colon polyps    History of UTI    Hyperlipidemia    Hypertension    Inflammatory polyps of colon ([place])    08/2008 OK 08/2010 Ok  byk Dr Earlean Shawl   Pneumonia 12/2011   Prediabetes    Ulcer    UTI (urinary tract infection)    Vitamin D deficiency     Patient Active Problem List   Diagnosis Date Noted   Hypothyroidism 06/10/2022   Hypercalcemia 09/10/2020   Facet arthritis, degenerative, 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   Left leg numbness 07/09/2020   Overactive bladder 01/02/2020   Leg edema 01/02/2020   Elevated TSH 06/08/2019   Acute cholecystitis 01/06/2019   Osteopenia 11/29/2018   Vitamin D deficiency 11/29/2018   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   Left groin pain 11/18/2014   Encounter for routine gynecological examination 05/08/2014   B12 deficiency 12/29/2011   Reactive airways dysfunction syndrome ([place]) 12/29/2011   Anemia 12/29/2011   Obesity (BMI 30-39.9) 11/09/2011   Gynecological examination 04/27/2011   Routine general medical examination at a health care facility 04/20/2011   Hypertension 01/12/2011    Hyperlipidemia 01/12/2011   Peptic ulcer disease 01/12/2011   Fibromyalgia 01/12/2011   Colon polyps 01/12/2011   Menopause syndrome 01/12/2011    Past Surgical History:  Procedure Laterality Date   CHOLECYSTECTOMY N/A 01/07/2019   Procedure: LAPAROSCOPIC CHOLECYSTECTOMY;  Surgeon: Ralene Ok, MD;  Location: [facility];  Service: General;  Laterality: N/A;   HERNIA REPAIR  1978    OB History   No obstetric history on file.      Home Medications    Prior to Admission medications   Medication Sig Start Date End Date Taking? Authorizing Provider  cephALEXin (KEFLEX) 500 MG capsule Take 1 capsule (500 mg total) by mouth 2 (two) times daily for 5 days. 09/29/22 10/04/22 Yes Sharion Balloon, NP  amitriptyline (ELAVIL) 25 MG tablet TAKE 1 TABLET(25 MG) BY MOUTH AT BEDTIME 06/07/22   McLean-Scocuzza, Nino Glow, MD  colestipol (COLESTID) 1 g tablet Take 2 tablets (2 g total) by mouth daily. 06/07/22   McLean-Scocuzza, Nino Glow, MD  fenofibrate micronized (ANTARA) 130 MG capsule Take 1 capsule (130 mg total) by mouth daily before breakfast. 06/03/22   McLean-Scocuzza, Nino Glow, MD  furosemide (LASIX) 20 MG tablet Take 1 tablet (20 mg total) by mouth daily as needed. 06/03/22 06/03/23  McLean-Scocuzza, Nino Glow, MD  olmesartan (BENICAR) 20 MG tablet Take 1 tablet (20 mg total) by mouth  daily. 06/03/22   McLean-Scocuzza, Nino Glow, MD  rosuvastatin (CRESTOR) 5 MG tablet Take 1 tablet (5 mg total) by mouth at bedtime. 06/03/22   McLean-Scocuzza, Nino Glow, MD  VITAMIN D PO Take by mouth.    [provider]    Family History Family History  Problem Relation Age of Onset   Hyperlipidemia Mother    Alzheimer's disease Mother    Cancer Mother        breast cancer dx'ed 26 y.o    Depression Mother    Miscarriages / [place] Mother    Colon polyps Mother    Diabetes Father    Heart disease Father        CABG x 7    Hyperlipidemia Father    Mental illness Father    Alzheimer's disease  Father    Depression Father    Early death Father    Colon polyps Father    Cancer Paternal Grandmother        colon   Diabetes Paternal Grandmother    Heart disease Cousin        sudden death less than 73 yrs old heart attack.   Deep vein thrombosis Maternal Grandmother    Cancer Maternal Grandfather        throat smoker   Ovarian cysts Granddaughter        ? cancer 65 y.o as of 09/10/20    Social History Social History   Tobacco Use   Smoking status: Former    Types: Cigarettes    Quit date: 10/11/1980    Years since quitting: 41.9   Smokeless tobacco: Never  Substance Use Topics   Alcohol use: No    Alcohol/week: 0.0 standard drinks of alcohol   Drug use: No     Allergies   Atorvastatin, Norvasc [amlodipine besylate], Simvastatin, and Zetia [ezetimibe]   Review of Systems Review of Systems  Constitutional:  Negative for chills and fever.  Gastrointestinal:  Positive for abdominal pain. Negative for diarrhea, nausea and vomiting.  Genitourinary:  Positive for dysuria and frequency. Negative for flank pain, hematuria, pelvic pain and vaginal discharge.  All other systems reviewed and are negative.    Physical Exam Triage Vital Signs ED Triage Vitals  Enc Vitals Group     BP 09/29/22 1240 122/80     Pulse Rate 09/29/22 1240 74     Resp 09/29/22 1240 17     Temp 09/29/22 1240 99 F (37.2 C)     Temp src --      SpO2 09/29/22 1240 98 %     Weight 09/29/22 1239 177 lb (80.3 kg)     Height 09/29/22 1239 '5\' 6"'$  (1.676 m)     Head Circumference --      Peak Flow --      Pain Score 09/29/22 1235 4     Pain Loc --      Pain Edu? --      Excl. in [place]? --    No data found.  Updated Vital Signs BP 122/80   Pulse 74   Temp 99 F (37.2 C)   Resp 17   Ht '5\' 6"'$  (1.676 m)   Wt 177 lb (80.3 kg)   LMP 10/11/2000   SpO2 98%   BMI 28.57 kg/m   Visual Acuity Right Eye Distance:   Left Eye Distance:   Bilateral Distance:    Right Eye Near:   Left Eye Near:     Bilateral Near:  Physical Exam Vitals and nursing note reviewed.  Constitutional:      General: She is not in acute distress.    Appearance: Normal appearance. She is well-developed. She is not ill-appearing.  HENT:     Mouth/Throat:     Mouth: Mucous membranes are moist.  Cardiovascular:     Rate and Rhythm: Normal rate and regular rhythm.     Heart sounds: Normal heart sounds.  Pulmonary:     Effort: Pulmonary effort is normal. No respiratory distress.     Breath sounds: Normal breath sounds.  Abdominal:     General: Bowel sounds are normal.     Palpations: Abdomen is soft.     Tenderness: There is no abdominal tenderness. There is no right CVA tenderness, left CVA tenderness, guarding or rebound.  Musculoskeletal:     Cervical back: Neck supple.  Skin:    General: Skin is warm and dry.  Neurological:     Mental Status: She is alert.  Psychiatric:        Mood and Affect: Mood normal.        Behavior: Behavior normal.      UC Treatments / Results  Labs (all labs ordered are listed, but only abnormal results are displayed) Labs Reviewed  POCT URINALYSIS DIP (MANUAL ENTRY) - Abnormal; Notable for the following components:      Result Value   Leukocytes, UA Small (1+) (*)    All other components within normal limits  URINE CULTURE    EKG   Radiology No results found.  Procedures Procedures (including critical care time)  Medications Ordered in UC Medications - No data to display  Initial Impression / Assessment and Plan / UC Course  I have reviewed the triage vital signs and the nursing notes.  Pertinent labs & imaging results that were available during my care of the patient were reviewed by me and considered in my medical decision making (see chart for details).    Dysuria.  Treating with Keflex. Urine culture pending. Discussed with patient that we will call her if the urine culture shows the need to change or discontinue the antibiotic. Instructed  her to follow-up with her PCP if her symptoms are not improving. Patient agrees to plan of care.     Final Clinical Impressions(s) / UC Diagnoses   Final diagnoses:  Dysuria     Discharge Instructions      Take the antibiotic as directed.  The urine culture is pending.  We will call you if it shows the need to change or discontinue your antibiotic.    Follow up with your primary care provider if your symptoms are not improving.        ED Prescriptions     Medication Sig Dispense Auth. Provider   cephALEXin (KEFLEX) 500 MG capsule Take 1 capsule (500 mg total) by mouth 2 (two) times daily for 5 days. 10 capsule Sharion Balloon, NP      PDMP not reviewed this encounter.   Sharion Balloon, NP 09/29/22 [phone]

## 2022-09-29 NOTE — Discharge Instructions (Signed)
Take the antibiotic as directed.  The urine culture is pending.  We will call you if it shows the need to change or discontinue your antibiotic.    Follow up with your primary care provider if your symptoms are not improving.    

## 2022-09-29 NOTE — ED Triage Notes (Signed)
Patient to Urgent Care with complaints of dysuria, malodorous urine, bladder pressure/ back pain, and urinary frequency x4 days.   Has been taking advil for the pain.

## 2022-09-29 NOTE — Telephone Encounter (Signed)
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 Office had to reschedule her TOC for February.  Appointment Request From: Beth Fisher   With Provider: Carollee Leitz, MD [facility] [place]]   Preferred Date Range: Any date 09/29/2022 or later   Preferred Times: Any Time   Reason for visit: Office Visit   Comments: Dr. Volanda Napoleon, I'm one of your new patients requesting a uti medication be called in to [facility]. I can  come in anytime to do a urine test. Woul love to get help asap. Thank you. [name].

## 2022-09-30 NOTE — Telephone Encounter (Signed)
She needs to be scheduled to come in

## 2022-09-30 NOTE — Telephone Encounter (Signed)
Patient went to Urgent Care and was treated

## 2022-10-01 ENCOUNTER — Ambulatory Visit: Payer: Medicare HMO | Admitting: Nurse Practitioner

## 2022-10-02 LAB — URINE CULTURE: Culture: 80000 — AB

## 2022-11-22 ENCOUNTER — Ambulatory Visit (INDEPENDENT_AMBULATORY_CARE_PROVIDER_SITE_OTHER): Payer: Medicare HMO

## 2022-11-22 DIAGNOSIS — E538 Deficiency of other specified B group vitamins: Secondary | ICD-10-CM

## 2022-11-22 MED ORDER — CYANOCOBALAMIN 1000 MCG/ML IJ SOLN
1000.0000 ug | Freq: Once | INTRAMUSCULAR | Status: AC
  Administered 2022-11-22: 1000 ug via INTRAMUSCULAR

## 2022-11-22 NOTE — Progress Notes (Signed)
Pt presented for their vitamin B12 injection. Pt was identified through two identifiers. Pt tolerated shot well in their right deltoid.  

## 2022-12-07 ENCOUNTER — Encounter: Payer: Medicare HMO | Admitting: Family Medicine

## 2022-12-08 DIAGNOSIS — Z1231 Encounter for screening mammogram for malignant neoplasm of breast: Secondary | ICD-10-CM | POA: Diagnosis not present

## 2022-12-08 LAB — HM MAMMOGRAPHY

## 2023-01-05 DIAGNOSIS — D225 Melanocytic nevi of trunk: Secondary | ICD-10-CM | POA: Diagnosis not present

## 2023-01-05 DIAGNOSIS — D2262 Melanocytic nevi of left upper limb, including shoulder: Secondary | ICD-10-CM | POA: Diagnosis not present

## 2023-01-05 DIAGNOSIS — L814 Other melanin hyperpigmentation: Secondary | ICD-10-CM | POA: Diagnosis not present

## 2023-01-05 DIAGNOSIS — L821 Other seborrheic keratosis: Secondary | ICD-10-CM | POA: Diagnosis not present

## 2023-01-05 DIAGNOSIS — D2272 Melanocytic nevi of left lower limb, including hip: Secondary | ICD-10-CM | POA: Diagnosis not present

## 2023-01-05 DIAGNOSIS — L988 Other specified disorders of the skin and subcutaneous tissue: Secondary | ICD-10-CM | POA: Diagnosis not present

## 2023-01-05 DIAGNOSIS — D2261 Melanocytic nevi of right upper limb, including shoulder: Secondary | ICD-10-CM | POA: Diagnosis not present

## 2023-01-05 DIAGNOSIS — D2271 Melanocytic nevi of right lower limb, including hip: Secondary | ICD-10-CM | POA: Diagnosis not present

## 2023-01-17 ENCOUNTER — Ambulatory Visit (INDEPENDENT_AMBULATORY_CARE_PROVIDER_SITE_OTHER): Payer: Medicare HMO | Admitting: Family Medicine

## 2023-01-17 ENCOUNTER — Encounter: Payer: Self-pay | Admitting: Family Medicine

## 2023-01-17 VITALS — BP 110/70 | HR 77 | Temp 97.3°F | Ht 66.0 in | Wt 194.0 lb

## 2023-01-17 DIAGNOSIS — R921 Mammographic calcification found on diagnostic imaging of breast: Secondary | ICD-10-CM | POA: Diagnosis not present

## 2023-01-17 DIAGNOSIS — R7309 Other abnormal glucose: Secondary | ICD-10-CM | POA: Diagnosis not present

## 2023-01-17 DIAGNOSIS — E559 Vitamin D deficiency, unspecified: Secondary | ICD-10-CM | POA: Diagnosis not present

## 2023-01-17 DIAGNOSIS — R7989 Other specified abnormal findings of blood chemistry: Secondary | ICD-10-CM

## 2023-01-17 DIAGNOSIS — E039 Hypothyroidism, unspecified: Secondary | ICD-10-CM

## 2023-01-17 DIAGNOSIS — R600 Localized edema: Secondary | ICD-10-CM | POA: Diagnosis not present

## 2023-01-17 DIAGNOSIS — E538 Deficiency of other specified B group vitamins: Secondary | ICD-10-CM | POA: Diagnosis not present

## 2023-01-17 DIAGNOSIS — I10 Essential (primary) hypertension: Secondary | ICD-10-CM

## 2023-01-17 DIAGNOSIS — E785 Hyperlipidemia, unspecified: Secondary | ICD-10-CM | POA: Diagnosis not present

## 2023-01-17 DIAGNOSIS — M797 Fibromyalgia: Secondary | ICD-10-CM

## 2023-01-17 DIAGNOSIS — E669 Obesity, unspecified: Secondary | ICD-10-CM

## 2023-01-17 NOTE — Patient Instructions (Addendum)
It was a pleasure meeting you today. Thank you for allowing me to take part in your health care.  Our goals for today as we discussed include:    Follow up in 3 months.  Schedule appointment for fasting labs 1 week prior to appointment.  Fast for 10 hours.   If you have any questions or concerns, please do not hesitate to call the office at [phone].  I look forward to our next visit and until then take care and stay safe.  Regards,   Dana Allan, MD   [facility]

## 2023-01-17 NOTE — Progress Notes (Signed)
presents today for injection per MD orders. B12 injection administered IM in left Upper Arm. Administration without incident. Patient tolerated well.  Kolter Reaver,cma   

## 2023-01-17 NOTE — Progress Notes (Signed)
SUBJECTIVE:   Chief Complaint  Patient presents with   Transitions Of Care   HPI Presents to clinic to transfer care.  No acute concerns today.  Hypertension Asymptomatic.  Takes Benicar 20 mg daily and tolerating well.  Hypertriglyceridemia/hyperlipidemia/hypercholesterolemia Chronic.  Compliant with Colestid 2 g daily, fenofibrate 130 mg daily, Crestor 5 mg daily.  Recent LDL not at goal.   Fibromyalgia Controlled with amitriptyline 25 mg nightly.  Has been on TCA for years.  Bilateral extremity edema Takes Lasix 20 mg as needed for lower extremity edema.  Previous echo 08/2016 with normal LVEF 50-55%.  She denies any resting or exertional shortness of breath, chest pain or heart palpitations.  No increase in weight.  BMI remains elevated.   B12 deficiency Currently on vitamin B12 1000 mcg IM monthly.  Due today.  Denies any malabsorption disorders, no history of gastric bypass, diarrhea/nausea or vomiting.  Does not follow vegan diet.  No previous intrinsic factor obtained.  Hypercalcemia/hypothyroid/osteopenia Chronic.  Asymptomatic.  Follows with endocrinology  Breast calcifications Received letter from [facility] mammography concern for breast arterial calcification in left breast.  Recommend mammo plus heart evaluation in 2 years.  History of hypercalcemia and dyslipidemia.  Discussed calcium score and can consider cardiac CT if not previously completed.    PERTINENT PMH / PSH: Hypertension Obesity class I Low vitamin B12 Low vitamin D Hypercalcemia Hypothyroid Fibromyalgia    OBJECTIVE:  BP 110/70   Pulse 77   Temp (!) 97.3 F (36.3 C) (Oral)   Ht 5\' 6"  (1.676 m)   Wt 194 lb (88 kg)   LMP 10/11/2000   SpO2 94%   BMI 31.31 kg/m    Physical Exam Vitals reviewed.  Constitutional:      General: She is not in acute distress.    Appearance: She is not ill-appearing.  HENT:     Head: Normocephalic.     Nose: Nose normal.  Eyes:      Conjunctiva/sclera: Conjunctivae normal.  Neck:     Thyroid: No thyromegaly or thyroid tenderness.     Vascular: No carotid bruit or JVD.  Cardiovascular:     Rate and Rhythm: Normal rate and regular rhythm.     Heart sounds: Normal heart sounds.  Pulmonary:     Effort: Pulmonary effort is normal.     Breath sounds: Normal breath sounds. No wheezing or rhonchi.  Abdominal:     General: Abdomen is flat. Bowel sounds are normal.     Palpations: Abdomen is soft.  Musculoskeletal:        General: No swelling or tenderness. Normal range of motion.     Cervical back: Normal range of motion.     Right lower leg: No edema.     Left lower leg: No edema.  Neurological:     Mental Status: She is alert and oriented to person, place, and time. Mental status is at baseline.  Psychiatric:        Mood and Affect: Mood normal.        Behavior: Behavior normal.        Thought Content: Thought content normal.        Judgment: Judgment normal.     ASSESSMENT/PLAN:  Primary hypertension Assessment & Plan: Chronic.  Asymptomatic.  Well-controlled on current medications. Continue olmesartan 20 mg daily Monitor blood pressure at home.  Goal less than 140/90 per JNC 8 guidelines for age.  Orders: -     Comprehensive metabolic panel; Future -  CBC with Differential/Platelet; Future  Hyperlipidemia, unspecified hyperlipidemia type Assessment & Plan: Chronic.  Recent lipid profile notable for elevated LDL, triglycerides and total cholesterol.  Currently on statin, fibrate and bile acid sequestrant. Continue Colestid 2 g twice daily Continue Crestor 5 mg daily, recommend increasing dose Continue fenofibrate 130 mg daily Repeat fasting lipids   Orders: -     Lipid panel; Future  B12 deficiency Assessment & Plan: Previously had low B12 serum levels.  Recent level greater than 1500.  Continues to get vitamin B12 injections monthly.  Injection due today Has not had intrinsic factor.  No  malabsorption issues. Check intrinsic factor Check vitamin B12 levels Consider switch to vitamin B12 sublingual daily, pending lab results. .  Orders: -     Vitamin B12; Future -     Intrinsic Factor Antibodies; Future  Vitamin D deficiency Assessment & Plan: Takes vitamin D3 6000 mg daily.  History of hypercalcemia and osteopenia. Follows with endocrinology.  Orders: -     VITAMIN D 25 Hydroxy (Vit-D Deficiency, Fractures); Future  Abnormal glucose -     Hemoglobin A1c; Future  Hypothyroidism, unspecified type Assessment & Plan: Chronic.  Stable.  Not currently on medication given previous TSH levels mildly elevated.  Follows with endocrinology. Check TSH levels  Orders: -     TSH; Future  Hypercalcemia Assessment & Plan: Chronic.  Asymptomatic. Follows with endocrinology.   Obesity (BMI 30-39.9) Assessment & Plan: BMI elevated.  Weight increased almost 20 pounds since last visit. Encouraged healthy diet and exercise Labs today    Leg edema Assessment & Plan: Chronic.  Multifactorial.  Denies any cardiorespiratory symptoms.  Takes Lasix 20 mg as needed. Recommend compression stockings Limit salt intake Elevate legs when able Increase activity to promote circulation Check c-Met, CBC   Breast calcifications on mammogram Assessment & Plan: Recent mammogram showed breast calcification in left breast.  Recommendation to have mammo plus heart every 2 years.  Routine mammogram screening recommended.   History of hypercalcemia, follows with endocrinology History of hyperlipidemia, hypertriglyceridemia and hypercholesterolemia.  Currently on medication follows with cardiology. Could consider calcium score in future.   Fibromyalgia Assessment & Plan: Chronic.  Stable.  Has been on TCA for years.  Has not tried any other medications. Continue amitriptyline 25 mg at night Discussed with patient risk and benefits of TCA use in elderly population Consider slow  wean in future and use of different medication.   HCM Declined pneumonia vaccine recommend pneumonia 20 Medicare annual wellness due Mammogram up-to-date.  Breast calcification noted in left breast.  Recommendations to have mammo plus heart every 2 years.  Routine mammogram due 11/2023. Tetanus up-to-date Colonoscopy 11/22.  : polyps x 2 Dr. Kinnie Scales.  Unable to review results in care everywhere or epic. Shingles up-to-date DEXA completed.  Osteopenia and low FRAX score.  PDMP reviewed  Return in about 3 months (around 04/18/2023) for PCP.  Dana Allan, MD

## 2023-01-22 ENCOUNTER — Encounter: Payer: Self-pay | Admitting: Family Medicine

## 2023-01-22 DIAGNOSIS — R921 Mammographic calcification found on diagnostic imaging of breast: Secondary | ICD-10-CM | POA: Insufficient documentation

## 2023-01-22 DIAGNOSIS — R7309 Other abnormal glucose: Secondary | ICD-10-CM | POA: Insufficient documentation

## 2023-01-22 NOTE — Assessment & Plan Note (Signed)
Chronic.  Recent lipid profile notable for elevated LDL, triglycerides and total cholesterol.  Currently on statin, fibrate and bile acid sequestrant. Continue Colestid 2 g twice daily Continue Crestor 5 mg daily, recommend increasing dose Continue fenofibrate 130 mg daily Repeat fasting lipids

## 2023-01-22 NOTE — Assessment & Plan Note (Signed)
Previously had low B12 serum levels.  Recent level greater than 1500.  Continues to get vitamin B12 injections monthly.  Injection due today Has not had intrinsic factor.  No malabsorption issues. Check intrinsic factor Check vitamin B12 levels Consider switch to vitamin B12 sublingual daily, pending lab results. Marland Kitchen

## 2023-01-22 NOTE — Assessment & Plan Note (Signed)
BMI elevated.  Weight increased almost 20 pounds since last visit. Encouraged healthy diet and exercise Labs today

## 2023-01-22 NOTE — Assessment & Plan Note (Signed)
Chronic.  Asymptomatic.  Well-controlled on current medications. Continue olmesartan 20 mg daily Monitor blood pressure at home.  Goal less than 140/90 per JNC 8 guidelines for age.

## 2023-01-22 NOTE — Assessment & Plan Note (Signed)
Takes vitamin D3 6000 mg daily.  History of hypercalcemia and osteopenia. Follows with endocrinology.

## 2023-01-22 NOTE — Assessment & Plan Note (Signed)
Recent mammogram showed breast calcification in left breast.  Recommendation to have mammo plus heart every 2 years.  Routine mammogram screening recommended.   History of hypercalcemia, follows with endocrinology History of hyperlipidemia, hypertriglyceridemia and hypercholesterolemia.  Currently on medication follows with cardiology. Could consider calcium score in future.

## 2023-01-22 NOTE — Assessment & Plan Note (Addendum)
Chronic.  Stable.  Not currently on medication given previous TSH levels mildly elevated.  Follows with endocrinology. Check TSH levels

## 2023-01-22 NOTE — Assessment & Plan Note (Addendum)
Chronic.  Multifactorial.  Denies any cardiorespiratory symptoms.  Takes Lasix 20 mg as needed. Recommend compression stockings Limit salt intake Elevate legs when able Increase activity to promote circulation Check c-Met, CBC

## 2023-01-22 NOTE — Assessment & Plan Note (Signed)
Chronic.  Stable.  Has been on TCA for years.  Has not tried any other medications. Continue amitriptyline 25 mg at night Discussed with patient risk and benefits of TCA use in elderly population Consider slow wean in future and use of different medication.

## 2023-01-22 NOTE — Assessment & Plan Note (Signed)
Chronic.  Asymptomatic. Follows with endocrinology.

## 2023-01-27 ENCOUNTER — Telehealth: Payer: Self-pay | Admitting: *Deleted

## 2023-01-27 NOTE — Telephone Encounter (Signed)
-----   Message from Dana Allan, MD sent at 01/22/2023  8:35 PM EDT ----- Coralee North, this message is a little bit long.  If there is anything that you need clarified before calling the patient please let me know.   If you think this is too much to review with the patient over the phone can call her to tell her that I have some things to review with her, nothing urgent, but would like to review before next visit.  Can be a virtual visit  Please call patient  Reviewed mammogram.  Shows breast arterial calcifications. They recommend having a mammogram plus the heart evaluation every 2 years to help with screening for cardiovascular disease.  Annual mammogram screening yearly.   This means that 1 year she will have a regular mammogram the following year she would have a mammogram plus the heart evaluation.  She is currently on 3 medications for her high total cholesterol and high triglycerides. Her previous LDL was not at goal of less than 100.  Dr. French Ana had recommended increasing her Crestor dose to 10 mg.  Did she receive this message.  I did not see cardiac CT scan completed.  This is a 3D image of the heart that measures the calcium deposits in the coronary arteries to determine risk of heart disease.  It is a $99 self-pay exam and if she would like me to order this I will place the order.  The imaging is done at [facility] and they will call her with an appointment.  If she wants to talk to her cardiologist about this first that would be fine.  If she has any questions, your response to her will be...   Let me schedule an appointment to discuss this in more detail with Dr. Clent Ridges and she will answer all your questions.

## 2023-01-27 NOTE — Telephone Encounter (Signed)
Patient prefers to discuss in virtual visit, Patient drivin will call back to schedule virtual APPT.

## 2023-02-02 ENCOUNTER — Encounter: Payer: Self-pay | Admitting: Family Medicine

## 2023-02-02 ENCOUNTER — Telehealth (INDEPENDENT_AMBULATORY_CARE_PROVIDER_SITE_OTHER): Payer: Medicare HMO | Admitting: Family Medicine

## 2023-02-02 VITALS — BP 110/70 | Ht 66.0 in

## 2023-02-02 DIAGNOSIS — R921 Mammographic calcification found on diagnostic imaging of breast: Secondary | ICD-10-CM

## 2023-02-02 DIAGNOSIS — E782 Mixed hyperlipidemia: Secondary | ICD-10-CM

## 2023-02-02 NOTE — Progress Notes (Addendum)
Virtual Visit via Telephone Note  I connected with Beth Fisher on 02/13/23 at 1622 by telephone and verified that I am speaking with the correct person using two identifiers. Beth Fisher is currently located at home and is currently alone during this visit. The provider, Dana Allan, MD, is located in their office at time of visit.  I discussed the limitations, risks, security and privacy concerns of performing an evaluation and management service by telephone and the availability of in person appointments. I also discussed with the patient that there may be a patient responsible charge related to this service. The patient expressed understanding and agreed to proceed.  Subjective: PCP: Dana Allan, MD  Chief Complaint  Patient presents with   Follow-up    Discuss recent mammogram & cholesterol (not a new issue)    Patient follow-up for discussion of recent mammogram.  I had sent MyChart message to patient to discuss findings on mammogram.  She recently had completed bilateral mammogram and and was noted to have breast arterial calcifications in the right breast.  She was advised to follow-up with PCP for further CAD evaluation.   Upon reviewing chart patients chart she is currently on Colestid 2 g daily, fenofibrate 130 mg daily and Crestor 5 mg nightly.  Reports had trial of increasing Crestor to 10 mg however due to side effects was decreased to 5 mg and seems to be doing well with this.  She has a family history of paternal CAD and is currently followed by cardiology.  Discussed with patient calcium score, initially thought would be beneficial however after reviewing chart does not appear that imaging would change management and likely will show calcium deposition given history. She will discuss with cardiology in future.  Educated that recommendations to continue annual mammograms with inclusion of mammo plus heart every other year.   ROS: Per HPI  Current Outpatient  Medications:    colestipol (COLESTID) 1 g tablet, Take 2 tablets (2 g total) by mouth daily., Disp: 180 tablet, Rfl: 3   fenofibrate micronized (ANTARA) 130 MG capsule, Take 1 capsule (130 mg total) by mouth daily before breakfast., Disp: 90 capsule, Rfl: 3   furosemide (LASIX) 20 MG tablet, Take 1 tablet (20 mg total) by mouth daily as needed., Disp: 30 tablet, Rfl: 5   olmesartan (BENICAR) 20 MG tablet, Take 1 tablet (20 mg total) by mouth daily., Disp: 90 tablet, Rfl: 3   rosuvastatin (CRESTOR) 5 MG tablet, Take 1 tablet (5 mg total) by mouth at bedtime., Disp: 90 tablet, Rfl: 3   VITAMIN D PO, Take by mouth., Disp: , Rfl:   Current Facility-Administered Medications:    cyanocobalamin ((VITAMIN B-12)) injection 1,000 mcg, 1,000 mcg, Intramuscular, Q30 days, McLean-Scocuzza, Pasty Spillers, MD, 1,000 mcg at 01/17/23 1459  Observations/Objective: A&O  No respiratory distress or wheezing audible over the phone Mood, judgement, and thought processes all WNL  Assessment and Plan: Breast calcifications on mammogram Assessment & Plan: Reviewed recent mammogram showed breast calcification in right breast.  Explained to patient that recommendation to have mammo plus heart every 2 years and routine mammogram screening annually History of hypercalcemia, follows with endocrinology History of hyperlipidemia, hypertriglyceridemia and hypercholesterolemia and family history of paternal CAD.   Currently on 3 lipid-lowering agents and follows with cardiology. Discussed  calcium score and patient prefers to wait to discuss with cardiology Encouraged healthy nutrition and increased activity     Mixed hyperlipidemia Assessment & Plan: Chronic.  Recent lipid profile  notable for elevated LDL, triglycerides and total cholesterol.  Currently on statin, fibrate and bile acid sequestrant.  Continue Colestid 2 g twice daily Continue Crestor 5 mg daily, recommend increasing dose Continue fenofibrate 130 mg  daily Follows with cardiology Could consider Repatha if remains elevated.       Follow Up Instructions: Return in about 2 months (around 04/18/2023).  I discussed the assessment and treatment plan with the patient. The patient was provided an opportunity to ask questions and all were answered. The patient agreed with the plan and demonstrated an understanding of the instructions.   The patient was advised to call back or seek an in-person evaluation if the symptoms worsen or if the condition fails to improve as anticipated.  The above assessment and management plan was discussed with the patient. The patient verbalized understanding of and has agreed to the management plan. Patient is aware to call the clinic if symptoms persist or worsen. Patient is aware when to return to the clinic for a follow-up visit. Patient educated on when it is appropriate to go to the emergency department.    Time call ended: 1700  I provided 35 minutes of non-face-to-face time during this encounter.  Dana Allan, MD

## 2023-02-13 ENCOUNTER — Encounter: Payer: Self-pay | Admitting: Family Medicine

## 2023-02-13 NOTE — Patient Instructions (Addendum)
It was a pleasure meeting you today. Thank you for allowing me to take part in your health care.  Our goals for today as we discussed include:  Reviewed mammogram.  Shows calcification in the arteries of the breast. They recommend having a mammogram plus the heart evaluation every 2 years to help with screening for cardiovascular disease.  Annual mammogram screening yearly.    This means that 1 year you will have a regular mammogram and the following year you would have a mammogram plus the heart evaluation.   Schedule Medicare Annual Wellness Visit   Pneumonia vaccine due.  Recommend pneumonia 20 as this is a 1 time vaccine.  No further vaccines required.  If you have any questions or concerns, please do not hesitate to call the office at [phone].  I look forward to our next visit and until then take care and stay safe.  Regards,   Dana Allan, MD   [facility]

## 2023-02-13 NOTE — Assessment & Plan Note (Signed)
Reviewed recent mammogram showed breast calcification in right breast.  Explained to patient that recommendation to have mammo plus heart every 2 years and routine mammogram screening annually History of hypercalcemia, follows with endocrinology History of hyperlipidemia, hypertriglyceridemia and hypercholesterolemia and family history of paternal CAD.   Currently on 3 lipid-lowering agents and follows with cardiology. Discussed  calcium score and patient prefers to wait to discuss with cardiology Encouraged healthy nutrition and increased activity

## 2023-02-13 NOTE — Assessment & Plan Note (Signed)
Chronic.  Recent lipid profile notable for elevated LDL, triglycerides and total cholesterol.  Currently on statin, fibrate and bile acid sequestrant.  Continue Colestid 2 g twice daily Continue Crestor 5 mg daily, recommend increasing dose Continue fenofibrate 130 mg daily Follows with cardiology Could consider Repatha if remains elevated.

## 2023-02-23 NOTE — Addendum Note (Signed)
Addended by: Enid Cutter on: 02/23/2023 10:27 PM   Modules accepted: Level of Service

## 2023-03-21 ENCOUNTER — Ambulatory Visit: Payer: Medicare HMO

## 2023-04-04 DIAGNOSIS — H40013 Open angle with borderline findings, low risk, bilateral: Secondary | ICD-10-CM | POA: Diagnosis not present

## 2023-04-04 DIAGNOSIS — H2513 Age-related nuclear cataract, bilateral: Secondary | ICD-10-CM | POA: Diagnosis not present

## 2023-04-12 ENCOUNTER — Other Ambulatory Visit (INDEPENDENT_AMBULATORY_CARE_PROVIDER_SITE_OTHER): Payer: Medicare HMO

## 2023-04-12 ENCOUNTER — Encounter: Payer: Self-pay | Admitting: Family Medicine

## 2023-04-12 DIAGNOSIS — E559 Vitamin D deficiency, unspecified: Secondary | ICD-10-CM

## 2023-04-12 DIAGNOSIS — E538 Deficiency of other specified B group vitamins: Secondary | ICD-10-CM

## 2023-04-12 DIAGNOSIS — I10 Essential (primary) hypertension: Secondary | ICD-10-CM | POA: Diagnosis not present

## 2023-04-12 DIAGNOSIS — E039 Hypothyroidism, unspecified: Secondary | ICD-10-CM | POA: Diagnosis not present

## 2023-04-12 DIAGNOSIS — R7309 Other abnormal glucose: Secondary | ICD-10-CM

## 2023-04-12 DIAGNOSIS — E785 Hyperlipidemia, unspecified: Secondary | ICD-10-CM | POA: Diagnosis not present

## 2023-04-12 LAB — CBC WITH DIFFERENTIAL/PLATELET
Basophils Absolute: 0 10*3/uL (ref 0.0–0.1)
Basophils Relative: 0.5 % (ref 0.0–3.0)
Eosinophils Absolute: 0.2 10*3/uL (ref 0.0–0.7)
Eosinophils Relative: 3.8 % (ref 0.0–5.0)
HCT: 40 % (ref 36.0–46.0)
Hemoglobin: 13.2 g/dL (ref 12.0–15.0)
Lymphocytes Relative: 35.4 % (ref 12.0–46.0)
Lymphs Abs: 1.5 10*3/uL (ref 0.7–4.0)
MCHC: 33 g/dL (ref 30.0–36.0)
MCV: 90.1 fl (ref 78.0–100.0)
Monocytes Absolute: 0.5 10*3/uL (ref 0.1–1.0)
Monocytes Relative: 12.5 % — ABNORMAL HIGH (ref 3.0–12.0)
Neutro Abs: 2 10*3/uL (ref 1.4–7.7)
Neutrophils Relative %: 47.8 % (ref 43.0–77.0)
Platelets: 306 10*3/uL (ref 150.0–400.0)
RBC: 4.44 Mil/uL (ref 3.87–5.11)
RDW: 13 % (ref 11.5–15.5)
WBC: 4.2 10*3/uL (ref 4.0–10.5)

## 2023-04-12 LAB — COMPREHENSIVE METABOLIC PANEL
ALT: 19 U/L (ref 0–35)
AST: 21 U/L (ref 0–37)
Albumin: 4.5 g/dL (ref 3.5–5.2)
Alkaline Phosphatase: 29 U/L — ABNORMAL LOW (ref 39–117)
BUN: 19 mg/dL (ref 6–23)
CO2: 25 mEq/L (ref 19–32)
Calcium: 10.5 mg/dL (ref 8.4–10.5)
Chloride: 108 mEq/L (ref 96–112)
Creatinine, Ser: 0.86 mg/dL (ref 0.40–1.20)
GFR: 67.49 mL/min (ref >60.00)
Glucose, Bld: 103 mg/dL — ABNORMAL HIGH (ref 70–99)
Potassium: 4 mEq/L (ref 3.5–5.1)
Sodium: 141 mEq/L (ref 135–145)
Total Bilirubin: 0.6 mg/dL (ref 0.2–1.2)
Total Protein: 7.3 g/dL (ref 6.0–8.3)

## 2023-04-12 LAB — LIPID PANEL
Cholesterol: 213 mg/dL — ABNORMAL HIGH (ref 0–200)
HDL: 46.9 mg/dL (ref >39.00)
LDL Cholesterol: 134 mg/dL — ABNORMAL HIGH (ref 0–99)
NonHDL: 166.26
Total CHOL/HDL Ratio: 5
Triglycerides: 162 mg/dL — ABNORMAL HIGH (ref 0.0–149.0)
VLDL: 32.4 mg/dL (ref 0.0–40.0)

## 2023-04-12 LAB — VITAMIN B12: Vitamin B-12: >1500 pg/mL — ABNORMAL HIGH (ref 211–911)

## 2023-04-12 LAB — TSH: TSH: 4.91 u[IU]/mL (ref 0.35–5.50)

## 2023-04-12 LAB — VITAMIN D 25 HYDROXY (VIT D DEFICIENCY, FRACTURES): VITD: 39.23 ng/mL (ref 30.00–100.00)

## 2023-04-12 LAB — HEMOGLOBIN A1C: Hgb A1c MFr Bld: 5.4 % (ref 4.6–6.5)

## 2023-04-13 ENCOUNTER — Other Ambulatory Visit: Payer: Self-pay | Admitting: Family Medicine

## 2023-04-13 ENCOUNTER — Encounter: Payer: Self-pay | Admitting: Family Medicine

## 2023-04-13 DIAGNOSIS — R399 Unspecified symptoms and signs involving the genitourinary system: Secondary | ICD-10-CM

## 2023-04-13 NOTE — Telephone Encounter (Signed)
Pt called in stating that pt was in office yesterday 04/13/23 and she pee on a cup for her UTI. Note below from [facility] CMA was read to him. Not in a good manners, been loud over the phone, he said that pt needs meds right now for that. He stated he will not get off the phone until he speak to provider or the nurse, he also said that he will sue everyone in this practice if his wife does not get her meds.

## 2023-04-13 NOTE — Telephone Encounter (Signed)
I spoke with husband & wife & tried to explain that we can not run or test urine without an order. Pt states that she has a UTI & Dr. Clent Ridges told her she could drop off a urine anytime she feels like she needs to. I told patient that is not true & that we should have not accepted a urine without an order.  When I told patient that she would require an appt to be evaluated & treated for a UTI and told me that the miscommunication is not between her and her doctor. Pt was upset & stated that we are refusing to treat her and allowing her to suffer in pain because we will not run her urine.  I told her that I would have the practice administrator reach out to her.

## 2023-04-13 NOTE — Telephone Encounter (Signed)
Patient called about MyChart note. She was asking about her urine results.

## 2023-04-15 LAB — INTRINSIC FACTOR ANTIBODIES: Intrinsic Factor: NEGATIVE

## 2023-04-18 ENCOUNTER — Other Ambulatory Visit: Payer: Medicare HMO

## 2023-04-18 ENCOUNTER — Ambulatory Visit
Admission: EM | Admit: 2023-04-18 | Discharge: 2023-04-18 | Disposition: A | Discharge status: Home / Self Care | Payer: Medicare HMO | Attending: Emergency Medicine | Admitting: Emergency Medicine

## 2023-04-18 ENCOUNTER — Ambulatory Visit: Payer: Medicare HMO | Admitting: Family Medicine

## 2023-04-18 DIAGNOSIS — M545 Low back pain, unspecified: Secondary | ICD-10-CM | POA: Diagnosis not present

## 2023-04-18 DIAGNOSIS — R300 Dysuria: Secondary | ICD-10-CM

## 2023-04-18 LAB — POCT URINALYSIS DIP (MANUAL ENTRY)
Bilirubin, UA: NEGATIVE
Blood, UA: NEGATIVE
Glucose, UA: NEGATIVE mg/dL
Ketones, POC UA: NEGATIVE mg/dL
Leukocytes, UA: NEGATIVE
Nitrite, UA: NEGATIVE
Protein Ur, POC: NEGATIVE mg/dL
Spec Grav, UA: 1.025 (ref 1.010–1.025)
Urobilinogen, UA: 0.2 E.U./dL
pH, UA: 5.5 (ref 5.0–8.0)

## 2023-04-18 NOTE — ED Provider Notes (Signed)
Renaldo Fiddler    CSN: [id number] Arrival date & time: 04/18/23  0801      History   Chief Complaint Chief Complaint  Patient presents with   Urinary Frequency    HPI Beth Fisher is a 72 y.o. female.  Patient presents with 2 week history of dysuria, low back pain, bladder pressure, muscle aches in both legs.  No fever, chills, hematuria, vomiting, diarrhea, constipation, blood in stool, vaginal discharge, pelvic pain, or other symptoms.  Treatment attempted with Azo and cranberry juice.  Her medical history includes hypertension, fibromyalgia, reactive airway disease, peptic ulcer disease, prediabetes.   The history is provided by the patient and medical records.    Past Medical History:  Diagnosis Date   Arthritis    Carotid stenosis    b/l w/o sx's    COVID-19    03/10/21   [facility]   Fibromyalgia    Fibromyalgia    Gastric ulcer    1970s   GERD (gastroesophageal reflux disease)    History of colon polyps    History of UTI    Hyperlipidemia    Hypertension    Inflammatory polyps of colon (HCC)    08/2008 OK 08/2010 Ok  byk Dr Kinnie Scales   Pneumonia 12/2011   Prediabetes    Ulcer    UTI (urinary tract infection)    Vitamin D deficiency     Patient Active Problem List   Diagnosis Date Noted   Abnormal glucose 01/22/2023   Breast calcifications on mammogram 01/22/2023   Hypothyroidism 06/10/2022   Hypercalcemia 09/10/2020   Facet arthritis, degenerative, 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   Left leg numbness 07/09/2020   Overactive bladder 01/02/2020   Leg edema 01/02/2020   Elevated TSH 06/08/2019   Acute cholecystitis 01/06/2019   Osteopenia 11/29/2018   Vitamin D deficiency 11/29/2018   Gastroesophageal reflux disease without esophagitis 03/25/2018   Postmenopausal 09/22/2017   Hemorrhoids without complication 06/18/2017   Carotid stenosis, asymptomatic, bilateral 07/15/2016   DOE (dyspnea on exertion) 07/15/2016   Dizziness 06/10/2016    Intermittent headache 06/10/2016   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   Left groin pain 11/18/2014   Encounter for routine gynecological examination 05/08/2014   B12 deficiency 12/29/2011   Reactive airways dysfunction syndrome (HCC) 12/29/2011   Anemia 12/29/2011   Obesity (BMI 30-39.9) 11/09/2011   Gynecological examination 04/27/2011   Routine general medical examination at a health care facility 04/20/2011   Hypertension 01/12/2011   Hyperlipidemia 01/12/2011   Peptic ulcer disease 01/12/2011   Fibromyalgia 01/12/2011   Colon polyps 01/12/2011   Menopause syndrome 01/12/2011    Past Surgical History:  Procedure Laterality Date   CHOLECYSTECTOMY N/A 01/07/2019   Procedure: LAPAROSCOPIC CHOLECYSTECTOMY;  Surgeon: Axel Filler, MD;  Location: [facility];  Service: General;  Laterality: N/A;   HERNIA REPAIR  1978    OB History   No obstetric history on file.      Home Medications    Prior to Admission medications   Medication Sig Start Date End Date Taking? Authorizing Provider  colestipol (COLESTID) 1 g tablet Take 2 tablets (2 g total) by mouth daily. 06/07/22   McLean-Scocuzza, Pasty Spillers, MD  fenofibrate micronized (ANTARA) 130 MG capsule Take 1 capsule (130 mg total) by mouth daily before breakfast. 06/03/22   McLean-Scocuzza, Pasty Spillers, MD  furosemide (LASIX) 20 MG tablet Take 1 tablet (20 mg total) by mouth daily as needed. 06/03/22 06/03/23  McLean-Scocuzza, Pasty Spillers, MD  olmesartan (BENICAR) 20 MG tablet Take 1 tablet (20 mg total) by mouth daily. 06/03/22   McLean-Scocuzza, Pasty Spillers, MD  rosuvastatin (CRESTOR) 5 MG tablet Take 1 tablet (5 mg total) by mouth at bedtime. 06/03/22   McLean-Scocuzza, Pasty Spillers, MD  VITAMIN D PO Take by mouth.    [provider]    Family History Family History  Problem Relation Age of Onset   Hyperlipidemia Mother    Alzheimer's disease Mother    Cancer Mother        breast cancer dx'ed 108 y.o    Depression Mother    Miscarriages /  [place] Mother    Colon polyps Mother    Diabetes Father    Heart disease Father        CABG x 7    Hyperlipidemia Father    Mental illness Father    Alzheimer's disease Father    Depression Father    Early death Father    Colon polyps Father    Cancer Paternal Grandmother        colon   Diabetes Paternal Grandmother    Heart disease Cousin        sudden death less than 24 yrs old heart attack.   Deep vein thrombosis Maternal Grandmother    Cancer Maternal Grandfather        throat smoker   Ovarian cysts Granddaughter        ? cancer 48 y.o as of 09/10/20    Social History Social History   Tobacco Use   Smoking status: Former    Types: Cigarettes    Quit date: 10/11/1980    Years since quitting: 42.5   Smokeless tobacco: Never  Substance Use Topics   Alcohol use: No    Alcohol/week: 0.0 standard drinks of alcohol   Drug use: No     Allergies   Atorvastatin, Norvasc [amlodipine besylate], Simvastatin, and Zetia [ezetimibe]   Review of Systems Review of Systems  Constitutional:  Negative for chills and fever.  Gastrointestinal:  Positive for abdominal pain. Negative for blood in stool, constipation, diarrhea, nausea and vomiting.  Genitourinary:  Positive for dysuria. Negative for flank pain, frequency, hematuria, pelvic pain and vaginal discharge.  Musculoskeletal:  Positive for back pain and myalgias. Negative for arthralgias, gait problem and joint swelling.  Skin:  Negative for color change and rash.  All other systems reviewed and are negative.    Physical Exam Triage Vital Signs ED Triage Vitals  Enc Vitals Group     BP      Pulse      Resp      Temp      Temp src      SpO2      Weight      Height      Head Circumference      Peak Flow      Pain Score      Pain Loc      Pain Edu?      Excl. in GC?    No data found.  Updated Vital Signs BP (!) 149/83   Pulse 79   Temp 98.3 F (36.8 C)   Resp 15   LMP 10/11/2000   SpO2 98%    Visual Acuity Right Eye Distance:   Left Eye Distance:   Bilateral Distance:    Right Eye Near:   Left Eye Near:    Bilateral Near:     Physical Exam Vitals and nursing note reviewed.  Constitutional:      General: She is not in acute distress.    Appearance: She is well-developed.  HENT:     Mouth/Throat:     Mouth: Mucous membranes are moist.  Cardiovascular:     Rate and Rhythm: Normal rate and regular rhythm.     Heart sounds: Normal heart sounds.  Pulmonary:     Effort: Pulmonary effort is normal. No respiratory distress.     Breath sounds: Normal breath sounds.  Abdominal:     General: Bowel sounds are normal.     Palpations: Abdomen is soft.     Tenderness: There is no abdominal tenderness. There is no right CVA tenderness, left CVA tenderness, guarding or rebound.  Musculoskeletal:     Cervical back: Neck supple.  Skin:    General: Skin is warm and dry.  Neurological:     General: No focal deficit present.     Mental Status: She is alert and oriented to person, place, and time.     Sensory: No sensory deficit.     Motor: No weakness.     Gait: Gait normal.      UC Treatments / Results  Labs (all labs ordered are listed, but only abnormal results are displayed) Labs Reviewed  POCT URINALYSIS DIP (MANUAL ENTRY)    EKG   Radiology No results found.  Procedures Procedures (including critical care time)  Medications Ordered in UC Medications - No data to display  Initial Impression / Assessment and Plan / UC Course  I have reviewed the triage vital signs and the nursing notes.  Pertinent labs & imaging results that were available during my care of the patient were reviewed by me and considered in my medical decision making (see chart for details).    Dysuria, low back pain.  Urine normal.  Afebrile and vital signs are stable.  Abdomen is soft and nontender with good bowel sounds.  No CVAT.  Discussed symptomatic treatment including increased  water intake, Tylenol or ibuprofen.  Instructed patient to follow-up with her PCP if her symptoms persist.  She agrees to plan of care.  Final Clinical Impressions(s) / UC Diagnoses   Final diagnoses:  Dysuria  Acute bilateral low back pain without sciatica     Discharge Instructions      Follow up with your primary care provider if your symptoms are not improving.        ED Prescriptions   None    PDMP not reviewed this encounter.   Mickie Bail, NP 04/18/23 [phone]

## 2023-04-18 NOTE — Discharge Instructions (Addendum)
Follow up with your primary care provider if your symptoms are not improving.     

## 2023-04-18 NOTE — ED Triage Notes (Signed)
Patient to Urgent Care with complaints of UTI symptoms that started two weeks ago. Reports lower back pain/ dysuria/ suprapubic pain/ bilateral leg aches and cramps. Denies any known fevers. Reports diarrhea before UTI symptoms.   Has been taking azo.

## 2023-04-22 ENCOUNTER — Encounter: Payer: Self-pay | Admitting: Family Medicine

## 2023-04-27 ENCOUNTER — Other Ambulatory Visit: Payer: Medicare HMO

## 2023-05-02 ENCOUNTER — Ambulatory Visit: Payer: Medicare HMO | Admitting: Family Medicine

## 2023-05-02 VITALS — BP 130/70 | HR 78 | Temp 98.0°F | Ht 66.0 in | Wt 194.0 lb

## 2023-05-02 DIAGNOSIS — I10 Essential (primary) hypertension: Secondary | ICD-10-CM | POA: Diagnosis not present

## 2023-05-02 DIAGNOSIS — E039 Hypothyroidism, unspecified: Secondary | ICD-10-CM | POA: Diagnosis not present

## 2023-05-02 DIAGNOSIS — N952 Postmenopausal atrophic vaginitis: Secondary | ICD-10-CM

## 2023-05-02 MED ORDER — ESTRADIOL 0.1 MG/GM VA CREA: 1.0000 | TOPICAL_CREAM | Freq: Every day | VAGINAL | 12 refills | Status: AC

## 2023-05-02 NOTE — Patient Instructions (Addendum)
It was a pleasure meeting you today. Thank you for allowing me to take part in your health care.  Our goals for today as we discussed include:  Decrease Vitamin  B 12 to 500 mcg every other day  Follow up with Cardiology and Endocrinology as scheduled  Start Estrogen cream and apply peas size to urethra daily for 2 weeks then two times weekly  Follow up with PCP as needed   If you have any questions or concerns, please do not hesitate to call the office at [phone].  I look forward to our next visit and until then take care and stay safe.  Regards,   Dana Allan, MD   [facility]

## 2023-05-02 NOTE — Progress Notes (Signed)
SUBJECTIVE:   Chief Complaint  Patient presents with   Follow-up    Follow up on lab work. Urgent care suggested for PCP to prescribe Itching/burning cream for the vulva area    HPI Presents to clinic to discuss lab work  Blood reviewed with patient She would like copies sent to her endocrinologist and cardiologist  Vaginal atrophy Recently seen in ED for dysuria.  Urine negative for infection.  As recommended she trial estrogen cream given continued symptoms of dysuria. Denies any vaginal discharge, bleeding or abdominal discomfort, back pain or fevers.    Hypertension Asymptomatic.  Takes Benicar 20 mg daily and tolerating well.  Hypertriglyceridemia/hyperlipidemia/hypercholesterolemia Chronic.  Compliant with Colestid 2 g daily, fenofibrate 130 mg daily, Crestor 5 mg daily.  Recent LDL not at goal.   B12 deficiency Recent B 12 level increased. Intrinsic factor normal.     PERTINENT PMH / PSH: Hypertension Obesity class I Low vitamin B12 Low vitamin D Hypercalcemia Hypothyroid Fibromyalgia    OBJECTIVE:  BP 130/70 (BP Location: Left Arm, Patient Position: Sitting, Cuff Size: Normal)   Pulse 78   Temp 98 F (36.7 C) (Oral)   Ht 5\' 6"  (1.676 m)   Wt 194 lb (88 kg)   LMP 10/11/2000   SpO2 97%   BMI 31.31 kg/m    Physical Exam Vitals reviewed.  Constitutional:      General: She is not in acute distress.    Appearance: She is not ill-appearing.  HENT:     Head: Normocephalic.     Nose: Nose normal.  Eyes:     Conjunctiva/sclera: Conjunctivae normal.  Neck:     Thyroid: No thyromegaly or thyroid tenderness.     Vascular: No carotid bruit or JVD.  Cardiovascular:     Rate and Rhythm: Normal rate and regular rhythm.     Heart sounds: Normal heart sounds.  Pulmonary:     Effort: Pulmonary effort is normal.     Breath sounds: Normal breath sounds. No wheezing or rhonchi.  Abdominal:     General: Abdomen is flat. Bowel sounds are normal.      Palpations: Abdomen is soft.  Musculoskeletal:        General: No swelling or tenderness. Normal range of motion.     Cervical back: Normal range of motion.     Right lower leg: No edema.     Left lower leg: No edema.  Neurological:     Mental Status: She is alert and oriented to person, place, and time. Mental status is at baseline.  Psychiatric:        Mood and Affect: Mood normal.        Behavior: Behavior normal.        Thought Content: Thought content normal.        Judgment: Judgment normal.     ASSESSMENT/PLAN:  Primary hypertension Assessment & Plan: Chronic.  Asymptomatic.  Well-controlled on current medications. Continue olmesartan 20 mg daily Continue Lasix 20 mg as needed Follow up with Cardiology as scheduled    Vaginal atrophy Assessment & Plan: Continues to have symptoms of dysuria despite negative urine.  Patient declined pelvic exam today Trial estrogen 0.01% cream. Apply pea size amount to urethra daily for 2 weeks then two times a week If no improvement will discontinue  Orders: -     Estradiol; Place 1 Applicatorful vaginally at bedtime. Apply daily for 2 weeks then twice weekly  Dispense: 42.5 g; Refill: 12  Hypothyroidism, unspecified type  Assessment & Plan: Recet TSH normal Follows with Endocrinology    HCM Declined pneumonia vaccine recommend pneumonia 20 Medicare annual wellness due Mammogram up-to-date.  Breast calcification noted in left breast.  Recommendations to have mammo plus heart every 2 years.  Routine mammogram due 11/2023. Tetanus up-to-date Colonoscopy 11/22.   polyps x 2 Dr. Kinnie Scales.  Unable to review results in care everywhere or epic. Shingles up-to-date DEXA completed.  Osteopenia and low FRAX score.  PDMP reviewed  Return if symptoms worsen or fail to improve, for PCP.  Dana Allan, MD

## 2023-05-14 ENCOUNTER — Encounter: Payer: Self-pay | Admitting: Family Medicine

## 2023-05-14 DIAGNOSIS — N952 Postmenopausal atrophic vaginitis: Secondary | ICD-10-CM | POA: Insufficient documentation

## 2023-05-14 NOTE — Assessment & Plan Note (Addendum)
Recet TSH normal Follows with Endocrinology

## 2023-05-14 NOTE — Assessment & Plan Note (Addendum)
Continues to have symptoms of dysuria despite negative urine.  Patient declined pelvic exam today Trial estrogen 0.01% cream. Apply pea size amount to urethra daily for 2 weeks then two times a week If no improvement will discontinue

## 2023-05-14 NOTE — Assessment & Plan Note (Signed)
Chronic.  Asymptomatic.  Well-controlled on current medications. Continue olmesartan 20 mg daily Continue Lasix 20 mg as needed Follow up with Cardiology as scheduled

## 2023-06-23 DIAGNOSIS — M8589 Other specified disorders of bone density and structure, multiple sites: Secondary | ICD-10-CM | POA: Diagnosis not present

## 2023-06-23 DIAGNOSIS — E039 Hypothyroidism, unspecified: Secondary | ICD-10-CM | POA: Diagnosis not present

## 2023-06-23 DIAGNOSIS — E538 Deficiency of other specified B group vitamins: Secondary | ICD-10-CM | POA: Diagnosis not present

## 2023-06-23 DIAGNOSIS — E559 Vitamin D deficiency, unspecified: Secondary | ICD-10-CM | POA: Diagnosis not present

## 2023-08-02 ENCOUNTER — Other Ambulatory Visit: Payer: Self-pay

## 2023-08-02 DIAGNOSIS — I10 Essential (primary) hypertension: Secondary | ICD-10-CM

## 2023-08-02 DIAGNOSIS — E785 Hyperlipidemia, unspecified: Secondary | ICD-10-CM

## 2023-08-02 MED ORDER — OLMESARTAN MEDOXOMIL 20 MG PO TABS: 20.0000 mg | ORAL_TABLET | Freq: Every day | ORAL | 3 refills | Status: AC

## 2023-08-02 MED ORDER — FENOFIBRATE MICRONIZED 130 MG PO CAPS: 130.0000 mg | ORAL_CAPSULE | Freq: Every day | ORAL | 3 refills | Status: AC

## 2023-08-02 MED ORDER — ROSUVASTATIN CALCIUM 5 MG PO TABS: 5.0000 mg | ORAL_TABLET | Freq: Every day | ORAL | 3 refills | Status: AC

## 2023-08-03 ENCOUNTER — Other Ambulatory Visit: Payer: Self-pay

## 2023-08-04 MED ORDER — COLESTIPOL HCL 1 G PO TABS: 2.0000 g | ORAL_TABLET | Freq: Every day | ORAL | 3 refills | Status: AC

## 2023-08-22 DIAGNOSIS — K219 Gastro-esophageal reflux disease without esophagitis: Secondary | ICD-10-CM | POA: Diagnosis not present

## 2023-08-22 DIAGNOSIS — I10 Essential (primary) hypertension: Secondary | ICD-10-CM | POA: Diagnosis not present

## 2023-08-22 DIAGNOSIS — G720 Drug-induced myopathy: Secondary | ICD-10-CM | POA: Diagnosis not present

## 2023-08-22 DIAGNOSIS — M797 Fibromyalgia: Secondary | ICD-10-CM | POA: Diagnosis not present

## 2023-08-22 DIAGNOSIS — E782 Mixed hyperlipidemia: Secondary | ICD-10-CM | POA: Diagnosis not present

## 2023-08-22 DIAGNOSIS — R42 Dizziness and giddiness: Secondary | ICD-10-CM | POA: Diagnosis not present

## 2023-08-22 DIAGNOSIS — T466X5A Adverse effect of antihyperlipidemic and antiarteriosclerotic drugs, initial encounter: Secondary | ICD-10-CM | POA: Diagnosis not present

## 2023-08-22 DIAGNOSIS — R011 Cardiac murmur, unspecified: Secondary | ICD-10-CM | POA: Diagnosis not present

## 2023-08-22 DIAGNOSIS — E66811 Obesity, class 1: Secondary | ICD-10-CM | POA: Diagnosis not present

## 2023-08-22 DIAGNOSIS — M1990 Unspecified osteoarthritis, unspecified site: Secondary | ICD-10-CM | POA: Diagnosis not present

## 2023-08-23 ENCOUNTER — Other Ambulatory Visit: Payer: Self-pay | Admitting: Internal Medicine

## 2023-08-23 DIAGNOSIS — R011 Cardiac murmur, unspecified: Secondary | ICD-10-CM

## 2023-08-23 DIAGNOSIS — E782 Mixed hyperlipidemia: Secondary | ICD-10-CM

## 2023-09-05 ENCOUNTER — Ambulatory Visit
Admission: RE | Admit: 2023-09-05 | Discharge: 2023-09-05 | Disposition: A | Discharge status: Home / Self Care | Payer: Medicare HMO | Source: Ambulatory Visit | Attending: Internal Medicine | Admitting: Internal Medicine

## 2023-09-05 DIAGNOSIS — E782 Mixed hyperlipidemia: Secondary | ICD-10-CM | POA: Insufficient documentation

## 2023-09-05 DIAGNOSIS — R011 Cardiac murmur, unspecified: Secondary | ICD-10-CM | POA: Insufficient documentation

## 2023-09-06 DIAGNOSIS — M8588 Other specified disorders of bone density and structure, other site: Secondary | ICD-10-CM | POA: Diagnosis not present

## 2023-11-09 DIAGNOSIS — M2550 Pain in unspecified joint: Secondary | ICD-10-CM | POA: Diagnosis not present

## 2023-12-14 DIAGNOSIS — Z1231 Encounter for screening mammogram for malignant neoplasm of breast: Secondary | ICD-10-CM | POA: Diagnosis not present

## 2023-12-21 DIAGNOSIS — Z79899 Other long term (current) drug therapy: Secondary | ICD-10-CM | POA: Diagnosis not present

## 2023-12-21 DIAGNOSIS — M0579 Rheumatoid arthritis with rheumatoid factor of multiple sites without organ or systems involvement: Secondary | ICD-10-CM | POA: Diagnosis not present

## 2024-02-06 DIAGNOSIS — D225 Melanocytic nevi of trunk: Secondary | ICD-10-CM | POA: Diagnosis not present

## 2024-02-06 DIAGNOSIS — Z0000 Encounter for general adult medical examination without abnormal findings: Secondary | ICD-10-CM | POA: Diagnosis not present

## 2024-02-06 DIAGNOSIS — D2261 Melanocytic nevi of right upper limb, including shoulder: Secondary | ICD-10-CM | POA: Diagnosis not present

## 2024-02-06 DIAGNOSIS — D2262 Melanocytic nevi of left upper limb, including shoulder: Secondary | ICD-10-CM | POA: Diagnosis not present

## 2024-02-06 DIAGNOSIS — D2272 Melanocytic nevi of left lower limb, including hip: Secondary | ICD-10-CM | POA: Diagnosis not present

## 2024-02-06 DIAGNOSIS — L7211 Pilar cyst: Secondary | ICD-10-CM | POA: Diagnosis not present

## 2024-02-06 DIAGNOSIS — D2271 Melanocytic nevi of right lower limb, including hip: Secondary | ICD-10-CM | POA: Diagnosis not present

## 2024-02-06 DIAGNOSIS — L821 Other seborrheic keratosis: Secondary | ICD-10-CM | POA: Diagnosis not present

## 2024-03-21 DIAGNOSIS — M0579 Rheumatoid arthritis with rheumatoid factor of multiple sites without organ or systems involvement: Secondary | ICD-10-CM | POA: Diagnosis not present

## 2024-04-05 DIAGNOSIS — H40013 Open angle with borderline findings, low risk, bilateral: Secondary | ICD-10-CM | POA: Diagnosis not present

## 2024-04-05 DIAGNOSIS — H2513 Age-related nuclear cataract, bilateral: Secondary | ICD-10-CM | POA: Diagnosis not present

## 2024-04-05 DIAGNOSIS — Z79899 Other long term (current) drug therapy: Secondary | ICD-10-CM | POA: Diagnosis not present

## 2024-06-20 DIAGNOSIS — M0579 Rheumatoid arthritis with rheumatoid factor of multiple sites without organ or systems involvement: Secondary | ICD-10-CM | POA: Diagnosis not present

## 2024-06-27 DIAGNOSIS — E039 Hypothyroidism, unspecified: Secondary | ICD-10-CM | POA: Diagnosis not present

## 2024-06-27 DIAGNOSIS — M8589 Other specified disorders of bone density and structure, multiple sites: Secondary | ICD-10-CM | POA: Diagnosis not present

## 2024-07-18 NOTE — Progress Notes (Signed)
 Beth Fisher                                          MRN: [id number]   07/18/2024   The [facility] Quality Team Specialist reviewed this patient medical record for the purposes of chart review for care gap closure. The following were reviewed: abstraction for care gap closure-controlling blood pressure.    [facility] Quality Team

## 2024-11-02 NOTE — Progress Notes (Signed)
 Beth Fisher                                          MRN: [id number]   11/02/2024   The [facility] Quality Team Specialist reviewed this patient medical record for the purposes of chart review for care gap closure. The following were reviewed: chart review for care gap closure-controlling blood pressure.    [facility] Quality Team
